# Patient Record
Sex: Male | Born: 1950 | Race: White | Hispanic: No | Smoking: Current every day smoker
Health system: Southern US, Community
[De-identification: ages and names within clinical notes are randomized; demographics above are authoritative.]

## PROBLEM LIST (undated history)

## (undated) DIAGNOSIS — I10 Essential (primary) hypertension: Secondary | ICD-10-CM

## (undated) DIAGNOSIS — J449 Chronic obstructive pulmonary disease, unspecified: Secondary | ICD-10-CM

## (undated) DIAGNOSIS — Z95810 Presence of automatic (implantable) cardiac defibrillator: Secondary | ICD-10-CM

## (undated) DIAGNOSIS — I2510 Atherosclerotic heart disease of native coronary artery without angina pectoris: Secondary | ICD-10-CM

## (undated) DIAGNOSIS — I509 Heart failure, unspecified: Secondary | ICD-10-CM

## (undated) DIAGNOSIS — E119 Type 2 diabetes mellitus without complications: Secondary | ICD-10-CM

## (undated) HISTORY — PX: PACEMAKER IMPLANT: EP1218

## (undated) HISTORY — PX: CARDIAC DEFIBRILLATOR PLACEMENT: SHX171

---

## 2021-05-12 ENCOUNTER — Encounter (HOSPITAL_COMMUNITY): Payer: Self-pay

## 2021-05-12 ENCOUNTER — Emergency Department (HOSPITAL_COMMUNITY): Payer: 59

## 2021-05-12 ENCOUNTER — Other Ambulatory Visit: Payer: Self-pay

## 2021-05-12 ENCOUNTER — Inpatient Hospital Stay (HOSPITAL_COMMUNITY)
Admission: EM | Admit: 2021-05-12 | Discharge: 2021-05-16 | DRG: 871 | Disposition: A | Discharge status: Home / Self Care | Payer: 59 | Attending: Family Medicine | Admitting: Family Medicine

## 2021-05-12 DIAGNOSIS — Z95810 Presence of automatic (implantable) cardiac defibrillator: Secondary | ICD-10-CM

## 2021-05-12 DIAGNOSIS — A419 Sepsis, unspecified organism: Secondary | ICD-10-CM | POA: Diagnosis present

## 2021-05-12 DIAGNOSIS — I10 Essential (primary) hypertension: Secondary | ICD-10-CM | POA: Diagnosis present

## 2021-05-12 DIAGNOSIS — E785 Hyperlipidemia, unspecified: Secondary | ICD-10-CM | POA: Diagnosis present

## 2021-05-12 DIAGNOSIS — Z955 Presence of coronary angioplasty implant and graft: Secondary | ICD-10-CM | POA: Diagnosis not present

## 2021-05-12 DIAGNOSIS — Z20822 Contact with and (suspected) exposure to covid-19: Secondary | ICD-10-CM | POA: Diagnosis present

## 2021-05-12 DIAGNOSIS — Z7982 Long term (current) use of aspirin: Secondary | ICD-10-CM | POA: Diagnosis not present

## 2021-05-12 DIAGNOSIS — R0789 Other chest pain: Secondary | ICD-10-CM | POA: Diagnosis not present

## 2021-05-12 DIAGNOSIS — I428 Other cardiomyopathies: Secondary | ICD-10-CM | POA: Diagnosis present

## 2021-05-12 DIAGNOSIS — E1169 Type 2 diabetes mellitus with other specified complication: Secondary | ICD-10-CM

## 2021-05-12 DIAGNOSIS — I959 Hypotension, unspecified: Secondary | ICD-10-CM | POA: Diagnosis not present

## 2021-05-12 DIAGNOSIS — Z8249 Family history of ischemic heart disease and other diseases of the circulatory system: Secondary | ICD-10-CM

## 2021-05-12 DIAGNOSIS — I255 Ischemic cardiomyopathy: Secondary | ICD-10-CM | POA: Diagnosis present

## 2021-05-12 DIAGNOSIS — F32A Depression, unspecified: Secondary | ICD-10-CM | POA: Diagnosis present

## 2021-05-12 DIAGNOSIS — I5043 Acute on chronic combined systolic (congestive) and diastolic (congestive) heart failure: Secondary | ICD-10-CM | POA: Diagnosis not present

## 2021-05-12 DIAGNOSIS — I513 Intracardiac thrombosis, not elsewhere classified: Secondary | ICD-10-CM | POA: Diagnosis present

## 2021-05-12 DIAGNOSIS — R6521 Severe sepsis with septic shock: Secondary | ICD-10-CM | POA: Diagnosis present

## 2021-05-12 DIAGNOSIS — I2510 Atherosclerotic heart disease of native coronary artery without angina pectoris: Secondary | ICD-10-CM | POA: Diagnosis present

## 2021-05-12 DIAGNOSIS — Z9119 Patient's noncompliance with other medical treatment and regimen: Secondary | ICD-10-CM | POA: Diagnosis not present

## 2021-05-12 DIAGNOSIS — Z95828 Presence of other vascular implants and grafts: Secondary | ICD-10-CM

## 2021-05-12 DIAGNOSIS — Z79899 Other long term (current) drug therapy: Secondary | ICD-10-CM

## 2021-05-12 DIAGNOSIS — J449 Chronic obstructive pulmonary disease, unspecified: Secondary | ICD-10-CM | POA: Diagnosis present

## 2021-05-12 DIAGNOSIS — Z515 Encounter for palliative care: Secondary | ICD-10-CM | POA: Diagnosis not present

## 2021-05-12 DIAGNOSIS — R072 Precordial pain: Secondary | ICD-10-CM

## 2021-05-12 DIAGNOSIS — I5022 Chronic systolic (congestive) heart failure: Secondary | ICD-10-CM | POA: Diagnosis not present

## 2021-05-12 DIAGNOSIS — I5042 Chronic combined systolic (congestive) and diastolic (congestive) heart failure: Secondary | ICD-10-CM | POA: Diagnosis present

## 2021-05-12 DIAGNOSIS — A4151 Sepsis due to Escherichia coli [E. coli]: Principal | ICD-10-CM | POA: Diagnosis present

## 2021-05-12 DIAGNOSIS — Z7984 Long term (current) use of oral hypoglycemic drugs: Secondary | ICD-10-CM

## 2021-05-12 DIAGNOSIS — F1910 Other psychoactive substance abuse, uncomplicated: Secondary | ICD-10-CM | POA: Diagnosis present

## 2021-05-12 DIAGNOSIS — B952 Enterococcus as the cause of diseases classified elsewhere: Secondary | ICD-10-CM | POA: Diagnosis present

## 2021-05-12 DIAGNOSIS — I110 Hypertensive heart disease with heart failure: Secondary | ICD-10-CM | POA: Diagnosis present

## 2021-05-12 DIAGNOSIS — N179 Acute kidney failure, unspecified: Secondary | ICD-10-CM | POA: Diagnosis present

## 2021-05-12 DIAGNOSIS — G894 Chronic pain syndrome: Secondary | ICD-10-CM | POA: Diagnosis present

## 2021-05-12 DIAGNOSIS — R079 Chest pain, unspecified: Secondary | ICD-10-CM | POA: Diagnosis not present

## 2021-05-12 DIAGNOSIS — E119 Type 2 diabetes mellitus without complications: Secondary | ICD-10-CM | POA: Diagnosis present

## 2021-05-12 DIAGNOSIS — F1410 Cocaine abuse, uncomplicated: Secondary | ICD-10-CM | POA: Diagnosis present

## 2021-05-12 DIAGNOSIS — Z7901 Long term (current) use of anticoagulants: Secondary | ICD-10-CM

## 2021-05-12 DIAGNOSIS — Z7189 Other specified counseling: Secondary | ICD-10-CM | POA: Diagnosis not present

## 2021-05-12 DIAGNOSIS — F17210 Nicotine dependence, cigarettes, uncomplicated: Secondary | ICD-10-CM | POA: Diagnosis present

## 2021-05-12 DIAGNOSIS — F419 Anxiety disorder, unspecified: Secondary | ICD-10-CM | POA: Diagnosis present

## 2021-05-12 DIAGNOSIS — K219 Gastro-esophageal reflux disease without esophagitis: Secondary | ICD-10-CM | POA: Diagnosis present

## 2021-05-12 HISTORY — DX: Essential (primary) hypertension: I10

## 2021-05-12 HISTORY — DX: Type 2 diabetes mellitus without complications: E11.9

## 2021-05-12 LAB — CBC WITH DIFFERENTIAL/PLATELET
Abs Immature Granulocytes: 0.17 10*3/uL — ABNORMAL HIGH (ref 0.00–0.07)
Basophils Absolute: 0.1 10*3/uL (ref 0.0–0.1)
Basophils Relative: 0 %
Eosinophils Absolute: 0 10*3/uL (ref 0.0–0.5)
Eosinophils Relative: 0 %
HCT: 42.6 % (ref 39.0–52.0)
Hemoglobin: 13 g/dL (ref 13.0–17.0)
Immature Granulocytes: 1 %
Lymphocytes Relative: 6 %
Lymphs Abs: 1.2 10*3/uL (ref 0.7–4.0)
MCH: 25 pg — ABNORMAL LOW (ref 26.0–34.0)
MCHC: 30.5 g/dL (ref 30.0–36.0)
MCV: 81.9 fL (ref 80.0–100.0)
Monocytes Absolute: 1.5 10*3/uL — ABNORMAL HIGH (ref 0.1–1.0)
Monocytes Relative: 7 %
Neutro Abs: 18.3 10*3/uL — ABNORMAL HIGH (ref 1.7–7.7)
Neutrophils Relative %: 86 %
Platelets: 267 10*3/uL (ref 150–400)
RBC: 5.2 MIL/uL (ref 4.22–5.81)
RDW: 19.1 % — ABNORMAL HIGH (ref 11.5–15.5)
WBC: 21.3 10*3/uL — ABNORMAL HIGH (ref 4.0–10.5)
nRBC: 0 % (ref 0.0–0.2)

## 2021-05-12 LAB — COMPREHENSIVE METABOLIC PANEL
ALT: 13 U/L (ref 0–44)
AST: 16 U/L (ref 15–41)
Albumin: 3.5 g/dL (ref 3.5–5.0)
Alkaline Phosphatase: 55 U/L (ref 38–126)
Anion gap: 6 (ref 5–15)
BUN: 23 mg/dL (ref 8–23)
CO2: 23 mmol/L (ref 22–32)
Calcium: 8.4 mg/dL — ABNORMAL LOW (ref 8.9–10.3)
Chloride: 103 mmol/L (ref 98–111)
Creatinine, Ser: 1.62 mg/dL — ABNORMAL HIGH (ref 0.61–1.24)
GFR, Estimated: 46 mL/min — ABNORMAL LOW (ref >60)
Glucose, Bld: 144 mg/dL — ABNORMAL HIGH (ref 70–99)
Potassium: 4.4 mmol/L (ref 3.5–5.1)
Sodium: 132 mmol/L — ABNORMAL LOW (ref 135–145)
Total Bilirubin: 3.1 mg/dL — ABNORMAL HIGH (ref 0.3–1.2)
Total Protein: 7.5 g/dL (ref 6.5–8.1)

## 2021-05-12 LAB — URINALYSIS, ROUTINE W REFLEX MICROSCOPIC
Bilirubin Urine: NEGATIVE
Glucose, UA: NEGATIVE mg/dL
Hgb urine dipstick: NEGATIVE
Ketones, ur: NEGATIVE mg/dL
Leukocytes,Ua: NEGATIVE
Nitrite: NEGATIVE
Protein, ur: NEGATIVE mg/dL
Specific Gravity, Urine: >1.046 — ABNORMAL HIGH (ref 1.005–1.030)
pH: 5 (ref 5.0–8.0)

## 2021-05-12 LAB — C-REACTIVE PROTEIN: CRP: 14.3 mg/dL — ABNORMAL HIGH (ref <1.0)

## 2021-05-12 LAB — ETHANOL: Alcohol, Ethyl (B): <10 mg/dL (ref <10)

## 2021-05-12 LAB — RAPID URINE DRUG SCREEN, HOSP PERFORMED
Amphetamines: NOT DETECTED
Barbiturates: NOT DETECTED
Benzodiazepines: POSITIVE — AB
Cocaine: NOT DETECTED
Opiates: NOT DETECTED
Tetrahydrocannabinol: POSITIVE — AB

## 2021-05-12 LAB — LACTIC ACID, PLASMA
Lactic Acid, Venous: 0.9 mmol/L (ref 0.5–1.9)
Lactic Acid, Venous: 0.7 mmol/L (ref 0.5–1.9)

## 2021-05-12 LAB — RESP PANEL BY RT-PCR (FLU A&B, COVID) ARPGX2
Influenza A by PCR: NEGATIVE
Influenza B by PCR: NEGATIVE
SARS Coronavirus 2 by RT PCR: NEGATIVE

## 2021-05-12 LAB — PROTIME-INR
INR: 1.3 — ABNORMAL HIGH (ref 0.8–1.2)
Prothrombin Time: 15.9 seconds — ABNORMAL HIGH (ref 11.4–15.2)

## 2021-05-12 LAB — TROPONIN I (HIGH SENSITIVITY)
Troponin I (High Sensitivity): 9 ng/L (ref <18)
Troponin I (High Sensitivity): 9 ng/L (ref <18)

## 2021-05-12 LAB — D-DIMER, QUANTITATIVE: D-Dimer, Quant: 0.99 ug/mL-FEU — ABNORMAL HIGH (ref 0.00–0.50)

## 2021-05-12 LAB — SEDIMENTATION RATE: Sed Rate: 31 mm/hr — ABNORMAL HIGH (ref 0–16)

## 2021-05-12 LAB — CBG MONITORING, ED: Glucose-Capillary: 89 mg/dL (ref 70–99)

## 2021-05-12 LAB — BRAIN NATRIURETIC PEPTIDE: B Natriuretic Peptide: 44 pg/mL (ref 0.0–100.0)

## 2021-05-12 MED ORDER — PAROXETINE HCL 20 MG PO TABS
20.0000 mg | ORAL_TABLET | Freq: Every day | ORAL | Status: DC
  Administered 2021-05-12 – 2021-05-16 (×5): 20 mg via ORAL
  Filled 2021-05-12 (×5): qty 1

## 2021-05-12 MED ORDER — ALBUTEROL SULFATE (2.5 MG/3ML) 0.083% IN NEBU: 2.5000 mg | INHALATION_SOLUTION | RESPIRATORY_TRACT | Status: DC | PRN

## 2021-05-12 MED ORDER — ATORVASTATIN CALCIUM 40 MG PO TABS: 40.0000 mg | ORAL_TABLET | Freq: Every day | ORAL | Status: DC

## 2021-05-12 MED ORDER — ACETAMINOPHEN 500 MG PO TABS
1000.0000 mg | ORAL_TABLET | Freq: Once | ORAL | Status: AC
  Administered 2021-05-12: 1000 mg via ORAL
  Filled 2021-05-12: qty 2

## 2021-05-12 MED ORDER — IOHEXOL 350 MG/ML SOLN
100.0000 mL | Freq: Once | INTRAVENOUS | Status: AC | PRN
  Administered 2021-05-12: 100 mL via INTRAVENOUS

## 2021-05-12 MED ORDER — SODIUM CHLORIDE 0.9 % IV SOLN
2.0000 g | Freq: Two times a day (BID) | INTRAVENOUS | Status: DC
  Administered 2021-05-13 (×2): 2 g via INTRAVENOUS
  Filled 2021-05-12 (×2): qty 2

## 2021-05-12 MED ORDER — PANTOPRAZOLE SODIUM 40 MG PO TBEC
40.0000 mg | DELAYED_RELEASE_TABLET | Freq: Every day | ORAL | Status: DC
  Administered 2021-05-12 – 2021-05-16 (×5): 40 mg via ORAL
  Filled 2021-05-12 (×5): qty 1

## 2021-05-12 MED ORDER — SODIUM CHLORIDE 0.9 % IV BOLUS: 500.0000 mL | Freq: Once | INTRAVENOUS | Status: DC

## 2021-05-12 MED ORDER — WARFARIN SODIUM 5 MG PO TABS
5.0000 mg | ORAL_TABLET | Freq: Once | ORAL | Status: AC
  Administered 2021-05-12: 5 mg via ORAL
  Filled 2021-05-12: qty 1

## 2021-05-12 MED ORDER — ONDANSETRON HCL 4 MG PO TABS: 4.0000 mg | ORAL_TABLET | Freq: Four times a day (QID) | ORAL | Status: DC | PRN

## 2021-05-12 MED ORDER — POLYETHYLENE GLYCOL 3350 17 G PO PACK: 17.0000 g | PACK | Freq: Every day | ORAL | Status: DC | PRN

## 2021-05-12 MED ORDER — INSULIN ASPART 100 UNIT/ML IJ SOLN: 0.0000 [IU] | Freq: Every day | INTRAMUSCULAR | Status: DC

## 2021-05-12 MED ORDER — WARFARIN - PHARMACIST DOSING INPATIENT: Freq: Every day | Status: DC

## 2021-05-12 MED ORDER — ATORVASTATIN CALCIUM 40 MG PO TABS
40.0000 mg | ORAL_TABLET | Freq: Every day | ORAL | Status: DC
  Administered 2021-05-12 – 2021-05-14 (×3): 40 mg via ORAL
  Filled 2021-05-12 (×3): qty 1

## 2021-05-12 MED ORDER — LACTATED RINGERS IV SOLN: INTRAVENOUS | Status: DC

## 2021-05-12 MED ORDER — SODIUM CHLORIDE 0.9 % IV BOLUS
500.0000 mL | Freq: Once | INTRAVENOUS | Status: AC
  Administered 2021-05-12: 500 mL via INTRAVENOUS

## 2021-05-12 MED ORDER — ONDANSETRON HCL 4 MG/2ML IJ SOLN: 4.0000 mg | Freq: Four times a day (QID) | INTRAMUSCULAR | Status: DC | PRN

## 2021-05-12 MED ORDER — VANCOMYCIN HCL 750 MG/150ML IV SOLN
750.0000 mg | Freq: Two times a day (BID) | INTRAVENOUS | Status: DC
  Administered 2021-05-13 – 2021-05-14 (×4): 750 mg via INTRAVENOUS
  Filled 2021-05-12 (×8): qty 150

## 2021-05-12 MED ORDER — SODIUM CHLORIDE 0.9 % IV SOLN
2.0000 g | Freq: Once | INTRAVENOUS | Status: AC
  Administered 2021-05-12: 2 g via INTRAVENOUS
  Filled 2021-05-12: qty 2

## 2021-05-12 MED ORDER — ACETAMINOPHEN 325 MG PO TABS
650.0000 mg | ORAL_TABLET | Freq: Four times a day (QID) | ORAL | Status: DC | PRN
  Administered 2021-05-12 – 2021-05-16 (×5): 650 mg via ORAL
  Filled 2021-05-12 (×5): qty 2

## 2021-05-12 MED ORDER — INSULIN ASPART 100 UNIT/ML IJ SOLN
0.0000 [IU] | Freq: Three times a day (TID) | INTRAMUSCULAR | Status: DC
  Administered 2021-05-13: 2 [IU] via SUBCUTANEOUS
  Administered 2021-05-14: 1 [IU] via SUBCUTANEOUS
  Administered 2021-05-15: 2 [IU] via SUBCUTANEOUS
  Administered 2021-05-16: 1 [IU] via SUBCUTANEOUS

## 2021-05-12 MED ORDER — METRONIDAZOLE 500 MG/100ML IV SOLN
500.0000 mg | Freq: Three times a day (TID) | INTRAVENOUS | Status: DC
  Administered 2021-05-12 – 2021-05-16 (×11): 500 mg via INTRAVENOUS
  Filled 2021-05-12 (×11): qty 100

## 2021-05-12 MED ORDER — VANCOMYCIN HCL IN DEXTROSE 1-5 GM/200ML-% IV SOLN
1000.0000 mg | Freq: Once | INTRAVENOUS | Status: AC
  Administered 2021-05-12: 1000 mg via INTRAVENOUS
  Filled 2021-05-12: qty 200

## 2021-05-12 MED ORDER — METRONIDAZOLE 500 MG/100ML IV SOLN
500.0000 mg | Freq: Once | INTRAVENOUS | Status: AC
  Administered 2021-05-12: 500 mg via INTRAVENOUS
  Filled 2021-05-12: qty 100

## 2021-05-12 MED ORDER — ACETAMINOPHEN 650 MG RE SUPP: 650.0000 mg | Freq: Four times a day (QID) | RECTAL | Status: DC | PRN

## 2021-05-12 MED ORDER — SODIUM CHLORIDE 0.9 % IV BOLUS
1000.0000 mL | Freq: Once | INTRAVENOUS | Status: AC
  Administered 2021-05-12: 1000 mL via INTRAVENOUS

## 2021-05-12 MED ORDER — NOREPINEPHRINE 4 MG/250ML-% IV SOLN
0.0000 ug/min | INTRAVENOUS | Status: DC
  Filled 2021-05-12: qty 250

## 2021-05-12 NOTE — ED Notes (Signed)
Admitting MD wants IV fluids held for now. IV fluids were stopped.

## 2021-05-12 NOTE — ED Provider Notes (Signed)
Emergency Department Provider Note   I have reviewed the triage vital signs and the nursing notes.   HISTORY  Chief Complaint Chest Pain   HPI Roger Mooney is a 70 y.o. male with PMH of HTN, DM, and pacemaker for reason unknown by the patient presents to the emergency department with chest discomfort.  Reports 24 to 48 hours of pressure.  States symptoms worsened last night.  He ultimately called EMS today and was found to be tachycardic.  He received aspirin along with 1 nitroglycerin with no improvement in pain symptoms.  He denies any fevers.  Patient is very unaware of his medical history.  He does have a pacemaker but is unsure exactly why that is the case.  He states he has been seen at "so many hospitals" but cannot tell me exactly where or when.  He does not have a cardiologist.  He tells me that he does take prescription medicines but is unsure which ones.  Tells me that he is currently "trying to get back on my feet" and living with family locally. Denies fever or cough.    Past Medical History:  Diagnosis Date  . Diabetes mellitus without complication (HCC)   . Hypertension     There are no problems to display for this patient.   Past Surgical History:  Procedure Laterality Date  . PACEMAKER IMPLANT      Allergies Patient has no known allergies.  No family history on file.  Social History Social History   Tobacco Use  . Smoking status: Current Every Day Smoker    Types: Cigarettes  . Smokeless tobacco: Never Used  Substance Use Topics  . Alcohol use: Not Currently  . Drug use: Yes    Types: Marijuana    Review of Systems  Constitutional: No fever/chills Eyes: No visual changes. ENT: No sore throat. Cardiovascular: Positive chest pain. Respiratory: Denies shortness of breath. Gastrointestinal: No abdominal pain. Mild nausea, no vomiting.  No diarrhea.  No constipation. Genitourinary: Negative for dysuria. Musculoskeletal: Negative for back  pain. Skin: Negative for rash. Neurological: Negative for headaches, focal weakness or numbness.  10-point ROS otherwise negative.  ____________________________________________   PHYSICAL EXAM:  VITAL SIGNS: ED Triage Vitals  Enc Vitals Group     BP 05/12/21 1139 (!) 89/73     Pulse Rate 05/12/21 1139 (!) 120     Resp 05/12/21 1142 20     Temp 05/12/21 1139 99.9 F (37.7 C)     Temp Source 05/12/21 1139 Oral     SpO2 05/12/21 1139 92 %     Weight 05/12/21 1141 180 lb (81.6 kg)     Height 05/12/21 1141 5\' 11"  (1.803 m)   Constitutional: Alert and oriented. Well appearing and in no acute distress. Eyes: Conjunctivae are normal.  Head: Atraumatic. Nose: No congestion/rhinnorhea. Mouth/Throat: Mucous membranes are moist.  Neck: No stridor.  Cardiovascular: Normal rate, regular rhythm. Good peripheral circulation. Grossly normal heart sounds.   Respiratory: Normal respiratory effort.  No retractions. Lungs CTAB. Gastrointestinal: Soft and nontender. No distention.  Musculoskeletal: No lower extremity tenderness nor edema. No gross deformities of extremities. Neurologic:  Normal speech and language. No gross focal neurologic deficits are appreciated.  Skin:  Skin is warm, dry and intact. No rash noted.   ____________________________________________   LABS (all labs ordered are listed, but only abnormal results are displayed)  Labs Reviewed  COMPREHENSIVE METABOLIC PANEL - Abnormal; Notable for the following components:      Result  Value   Sodium 132 (*)    Glucose, Bld 144 (*)    Creatinine, Ser 1.62 (*)    Calcium 8.4 (*)    Total Bilirubin 3.1 (*)    GFR, Estimated 46 (*)    All other components within normal limits  CBC WITH DIFFERENTIAL/PLATELET - Abnormal; Notable for the following components:   WBC 21.3 (*)    MCH 25.0 (*)    RDW 19.1 (*)    Neutro Abs 18.3 (*)    Monocytes Absolute 1.5 (*)    Abs Immature Granulocytes 0.17 (*)    All other components  within normal limits  D-DIMER, QUANTITATIVE - Abnormal; Notable for the following components:   D-Dimer, Quant 0.99 (*)    All other components within normal limits  CULTURE, BLOOD (ROUTINE X 2)  CULTURE, BLOOD (ROUTINE X 2)  RESP PANEL BY RT-PCR (FLU A&B, COVID) ARPGX2  BRAIN NATRIURETIC PEPTIDE  LACTIC ACID, PLASMA  LACTIC ACID, PLASMA  URINALYSIS, ROUTINE W REFLEX MICROSCOPIC  TROPONIN I (HIGH SENSITIVITY)  TROPONIN I (HIGH SENSITIVITY)   ____________________________________________  EKG   EKG Interpretation  Date/Time:  Monday May 12 2021 11:41:49 EDT Ventricular Rate:  116 PR Interval:  120 QRS Duration: 89 QT Interval:  313 QTC Calculation: 435 R Axis:   -45 Text Interpretation: Sinus tachycardia Multiform ventricular premature complexes Biatrial enlargement Left anterior fascicular block Abnormal lateral Q waves Anteroseptal infarct, old No old tracing for comparison Confirmed by Alona Bene [phone]) on 05/12/2021 11:44:18 AM       ____________________________________________  RADIOLOGY  DG Chest Portable 1 View  Result Date: 05/12/2021 CLINICAL DATA:  70 year old male with history of chest pain. EXAM: PORTABLE CHEST 1 VIEW COMPARISON:  No priors. FINDINGS: Widespread areas of interstitial prominence and diffuse peribronchial cuffing. Lung volumes are normal. No consolidative airspace disease. No pleural effusions. No pneumothorax. No pulmonary nodule or mass noted. Pulmonary vasculature and the cardiomediastinal silhouette are within normal limits. Left-sided pacemaker/AICD in place with lead tips projecting over the expected location of the right atrium and right ventricle. IMPRESSION: 1. The appearance of the chest suggests bronchitis, as above. Electronically Signed   By: Trudie Reed M.D.   On: 05/12/2021 12:38    ____________________________________________   PROCEDURES  Procedure(s) performed:   Procedures  CRITICAL CARE Performed by: Maia Plan Total critical care time: 35 minutes Critical care time was exclusive of separately billable procedures and treating other patients. Critical care was necessary to treat or prevent imminent or life-threatening deterioration. Critical care was time spent personally by me on the following activities: development of treatment plan with patient and/or surrogate as well as nursing, discussions with consultants, evaluation of patient's response to treatment, examination of patient, obtaining history from patient or surrogate, ordering and performing treatments and interventions, ordering and review of laboratory studies, ordering and review of radiographic studies, pulse oximetry and re-evaluation of patient's condition.  Alona Bene, MD Emergency Medicine  ____________________________________________   INITIAL IMPRESSION / ASSESSMENT AND PLAN / ED COURSE  Pertinent labs & imaging results that were available during my care of the patient were reviewed by me and considered in my medical decision making (see chart for details).   Patient presents to the emergency department with pain in the chest.  He arrives tachycardic with soft blood pressures.  Borderline fever but no infectious complaints.  History is very limited.  Patient has very little insight into his medical history and conditions.  No known history of dementia.  No family here for additional history.  I have looked in care everywhere but do not have additional records at this time.  Plan for septic work-up along with troponins, BNP, IV fluids.  Patient has warm extremities.  He does not appear acutely volume overloaded. Differential includes PE, ACS, dehydration. EKG interpreted by me as above.   03:00 PM  BP improving with IVF. Started empiric abx with significant leukocytosis. Lactate normal. Patient remains well perfused and is fluid responsive here. Exam not consistent with cardiogenic shock. Will interrogate [employer]  pacemaker. Cultures sent. COVID negative. D-dimer is elevated. Plan for CTA PE study.   Care transferred to Dr. Charm Barges pending CTA.  ____________________________________________  FINAL CLINICAL IMPRESSION(S) / ED DIAGNOSES  Final diagnoses:  Precordial chest pain     MEDICATIONS GIVEN DURING THIS VISIT:  Medications  vancomycin (VANCOCIN) IVPB 1000 mg/200 mL premix (has no administration in time range)  ceFEPIme (MAXIPIME) 2 g in sodium chloride 0.9 % 100 mL IVPB (has no administration in time range)  vancomycin (VANCOREADY) IVPB 750 mg/150 mL (has no administration in time range)  lactated ringers infusion (has no administration in time range)  sodium chloride 0.9 % bolus 500 mL (0 mLs Intravenous Stopped 05/12/21 1223)  acetaminophen (TYLENOL) tablet 1,000 mg (1,000 mg Oral Given 05/12/21 1354)  ceFEPIme (MAXIPIME) 2 g in sodium chloride 0.9 % 100 mL IVPB (0 g Intravenous Stopped 05/12/21 1443)  metroNIDAZOLE (FLAGYL) IVPB 500 mg (500 mg Intravenous New Bag/Given 05/12/21 1356)  sodium chloride 0.9 % bolus 1,000 mL (1,000 mLs Intravenous New Bag/Given 05/12/21 1356)  iohexol (OMNIPAQUE) 350 MG/ML injection 100 mL (100 mLs Intravenous Contrast Given 05/12/21 1511)    Note:  This document was prepared using Dragon voice recognition software and may include unintentional dictation errors.  Alona Bene, MD, [facility] Emergency Medicine    Icelynn Onken, Arlyss Repress, MD 05/15/21 [phone]

## 2021-05-12 NOTE — Progress Notes (Signed)
Pharmacy Antibiotic Note  Roger Mooney a 70 y.o. male admitted on 05/12/2021 with sepsis.  Pharmacy has been consulted for vancomycin and cefepime dosing.  Plan: Vancomycin 750mg  IV every 12 hours.  Goal trough 15-20 mcg/mL. Cefepime 2gm IV every 12 hours.  Medical History: Past Medical History:  Diagnosis Date  . Diabetes mellitus without complication (HCC)   . Hypertension     Allergies:  No Known Allergies  Filed Weights   05/12/21 1141  Weight: 81.6 kg (180 lb)    CBC Latest Ref Rng & Units 05/12/2021  WBC 4.0 - 10.5 K/uL 21.3(H)  Hemoglobin 13.0 - 17.0 g/dL 05/14/2021  Hematocrit 10.9 - 52.0 % 42.6  Platelets 150 - 400 K/uL 267     Estimated Creatinine Clearance: 45.8 mL/min (A) (by C-G formula based on SCr of 1.62 mg/dL (H)).  Antibiotics Given (last 72 hours)    Date/Time Action Medication Dose Rate   05/12/21 1356 New Bag/Given   metroNIDAZOLE (FLAGYL) IVPB 500 mg 500 mg 100 mL/hr   05/12/21 1401 New Bag/Given   ceFEPIme (MAXIPIME) 2 g in sodium chloride 0.9 % 100 mL IVPB 2 g 200 mL/hr      Antimicrobials this admission: Cefepime 05/12/2021  >>  vancomycin 05/12/2021  >>  Metronidazole 05/12/2021   x 1   Microbiology results: 05/12/2021  BCx: sent 05/12/2021  UCx: sent 05/12/2021  Resp Panel: negative 05/12/2021  MRSA PCR: sent  Thank you for allowing pharmacy to be a part of this patient's care.  05/14/2021, PharmD Clinical Pharmacist

## 2021-05-12 NOTE — ED Notes (Signed)
Pt cleaned due to incontience at this time. Pt continues to curse and yell at staff.

## 2021-05-12 NOTE — ED Notes (Signed)
Levophed held at this time due to sbp parameters >90. Will continue to monitor

## 2021-05-12 NOTE — H&P (Signed)
History and Physical    Roger Mooney KGU:[id number] DOB: [birth year] DOA: 05/12/2021  PCP: Pcp, No   Patient coming from: Home  I have personally briefly reviewed patient's old medical records in [place]  Chief Complaint: Chest pain  HPI: Roger Mooney is a 70 y.o. male with medical history significant for LV thrombus, chronic systolic heart failure EF of 25%, AICD, CKD, diabetes mellitus.    Patient presented to the ED today with complaints of chest pain.  History from patient is limited as patient is a poor historian.  Patient reports onset of chest pain over the past 2 days, intermittent.  Reports improvement in chest pain for with pain medications, he is unaware of any aggravating factors. He Is unable to tell me if chest pain is related to activity.  He reports associated difficulty breathing with chest pains.  Reports chest pain is both on the right central and left side of his chest.  No associated nausea or vomiting.  No loose stools.  He reports appetite has been okay. On my evaluation, patient was getting easily agitated and talking in a more aggressive voice when trying to answer questions.  Patient used to live in [place], moved to the area and lives with his granddaughter Roger Mooney.  Patient was recently hospitalized at [facility] for 4/14-4/41 and prior to that at [facility] [place] 4/9 through 4/14-where he was managed for respiratory failure secondary to aspiration pneumonia with septic shock and requiring mechanical ventilation and Levophed.  UDS was positive for benzos, cocaine, opiates and THC.  Hospitalization was complicated by development of vascular congestion/fluid overload requiring Lasix, ventricular tachycardia requiring amiodarone, on CKD with creatinine up to 1.5, discharge creatinine of 1.19.  An LV thrombus was found on echocardiogram 03/27/2021, requiring warfarin.  Patient required CIWA for alcohol withdrawal.  Patient was subsequently  transferred to tertiary center[facility] for further care. The only culture results I can see-  tracheal aspirate which showed light growth of yeast, 2 colonies of yeast.  ED Course: T max- 99.9, heart rate initially 120s improved to 70s, respiratory rate 16- 31.  Blood pressure systolic dropped to 62/83, was persistent despite 2 L bolus of fluid given.  WBC 21.  Troponin 9.  BNP 44.  EKG shows sinus tachycardia rate 116.  Creatinine elevated 1.6.  CTA chest - negative for PE, showed extensive pericardial thickening and small volume of pericardial fluid.  Clinical correlation for signs or symptoms of acute pericarditis recommended.   EDP talked to cardiologist on-call, Dr. Marlou Porch, he did not feel that the pericardial thickening and small pleural effusion fit the picture of the patient's clinical condition, and did not think this required transfer to [facility] for admission.  I was initially called to admit patient, with systolic in the 15V status post 2 L bolus, now on 125 cc/h Ringer's lactate, EF of 25%, recent septic shock and possible infectious etiology for this presentation, I requested central line placement.  ED provider placed a femoral line, and subsequently blood pressure improved, Levophed was held.   Review of Systems: As per HPI all other systems reviewed and negative.  Past Medical History:  Diagnosis Date  . Diabetes mellitus without complication ([place])   . Hypertension     Past Surgical History:  Procedure Laterality Date  . PACEMAKER IMPLANT       reports that he has been smoking cigarettes. He has never used smokeless tobacco. He reports previous alcohol use. He reports current drug  use. Drug: Marijuana.  No Known Allergies  Family history of hypertension.  Prior to Admission medications   Medication Sig Start Date End Date Taking? Authorizing Provider  aspirin 81 MG chewable tablet Chew 81 mg by mouth daily. Took 3-4 weeks ago 02/24/21  Yes [provider]   atorvastatin (LIPITOR) 40 MG tablet Take 1 tablet by mouth daily. 03/19/21  Yes [provider]  isosorbide mononitrate (IMDUR) 30 MG 24 hr tablet Take 30 mg by mouth daily. 05/04/21  Yes [provider]  lisinopril (ZESTRIL) 5 MG tablet Take 5 mg by mouth daily. 03/19/21  Yes [provider]  metFORMIN (GLUCOPHAGE) 500 MG tablet Take 500 mg by mouth 2 (two) times daily. 03/19/21  Yes [provider]  metoprolol succinate (TOPROL-XL) 25 MG 24 hr tablet Take 0.5 tablets by mouth daily. 05/04/21  Yes [provider]  pantoprazole (PROTONIX) 40 MG tablet Take 40 mg by mouth daily. 03/19/21  Yes [provider]  PARoxetine (PAXIL) 20 MG tablet Take 20 mg by mouth daily. 05/04/21  Yes [provider]  warfarin (COUMADIN) 2 MG tablet Take 2 mg by mouth daily. 04/03/21  Yes [provider]    Physical Exam: Vitals:   05/12/21 1845 05/12/21 1900 05/12/21 1915 05/12/21 1930  BP: 98/71 95/69 102/64 101/73  Pulse: 77 78 77 87  Resp: _0 Temp:      TempSrc:      SpO2: 100% 99% 100% 100%  Weight:      Height:        Constitutional: NAD, calm, comfortable Vitals:   05/12/21 1845 05/12/21 1900 05/12/21 1915 05/12/21 1930  BP: 98/71 95/69 102/64 101/73  Pulse: 77 78 77 87  Resp: _1 Temp:      TempSrc:      SpO2: 100% 99% 100% 100%  Weight:      Height:       Eyes: PERRL, lids and conjunctivae normal ENMT: Mucous membranes are moist.   Neck: normal, supple, no masses, no thyromegaly Respiratory: clear to auscultation bilaterally, no wheezing, no crackles. Normal respiratory effort. No accessory muscle use.  Cardiovascular: Regular rate and rhythm, no murmurs / rubs / gallops. No extremity edema. 2+ pedal pulses.   Abdomen: no tenderness, no masses palpated. No hepatosplenomegaly. Bowel sounds positive.  Musculoskeletal: no clubbing / cyanosis. No joint deformity upper and lower extremities. Good ROM, no  contractures. Normal muscle tone.  Skin: no rashes, lesions, ulcers. No induration Neurologic: No apparent cranial abnormality, moving extremities spontaneously. Psychiatric: Normal judgment and insight. Alert and oriented x 3. Normal mood.   Labs on Admission: I have personally reviewed following labs and imaging studies  CBC: Recent Labs  Lab 05/12/21 1201  WBC 21.3*  NEUTROABS 18.3*  HGB 13.0  HCT 42.6  MCV 81.9  PLT 697   Basic Metabolic Panel: Recent Labs  Lab 05/12/21 1201  NA 132*  K 4.4  CL 103  CO2 23  GLUCOSE 144*  BUN 23  CREATININE 1.62*  CALCIUM 8.4*   Liver Function Tests: Recent Labs  Lab 05/12/21 1201  AST 16  ALT 13  ALKPHOS 55  BILITOT 3.1*  PROT 7.5  ALBUMIN 3.5   Coagulation Profile: Recent Labs  Lab 05/12/21 1755  INR 1.3*    Radiological Exams on Admission: CT Angio Chest PE W and/or Wo Contrast  Result Date: 05/12/2021 CLINICAL DATA:  70 year old male with history of central chest pain and  shortness of breath. EXAM: CT ANGIOGRAPHY CHEST WITH CONTRAST TECHNIQUE: Multidetector CT imaging of the chest was performed using the standard protocol during bolus administration of intravenous contrast. Multiplanar CT image reconstructions and MIPs were obtained to evaluate the vascular anatomy. CONTRAST:  154m OMNIPAQUE IOHEXOL 350 MG/ML SOLN COMPARISON:  No priors. FINDINGS: Cardiovascular: No filling defects within the pulmonary arterial to suggest pulmonary emboli. Heart size is normal. Extensive pericardial thickening and small volume of pericardial fluid. No pericardial calcification. There is aortic atherosclerosis, as well as atherosclerosis of the great vessels of the mediastinum and the coronary arteries, including calcified atherosclerotic plaque in the left main, left anterior descending, left circumflex and right coronary arteries. Proximal left anterior descending coronary artery stent. Left-sided pacemaker device in place with lead tips  terminating in the right atrium and right ventricular apex. Mediastinum/Nodes: No pathologically enlarged mediastinal or hilar lymph nodes. Esophagus is unremarkable in appearance. No axillary lymphadenopathy. Lungs/Pleura: Study is limited by extensive patient respiratory motion. With these limitations in mind, there are no definite suspicious appearing pulmonary nodules or masses are noted. Patchy areas of ground-glass attenuation and septal thickening with thickening of the peribronchovascular interstitium and regional architectural distortion, most evident throughout the mid to upper lungs, concerning for potential interstitial lung disease such as hypersensitivity pneumonitis. No acute consolidative airspace disease. Trace right pleural effusion. No left pleural effusion. Upper Abdomen: Aortic atherosclerosis. Musculoskeletal: There are no aggressive appearing lytic or blastic lesions noted in the visualized portions of the skeleton. Review of the MIP images confirms the above findings. IMPRESSION: 1. No evidence of pulmonary embolism. 2. Extensive pericardial thickening and small volume of pericardial fluid. Clinical correlation for signs and symptoms of acute pericarditis is recommended. No pericardial calcification noted at this time. 3. Trace right pleural effusion. 4. The appearance of the lungs is suggestive of potential interstitial lung disease such as chronic hypersensitivity pneumonitis. Follow-up nonemergent high-resolution chest CT is recommended in 6 months to assess for temporal changes in the appearance of the lung parenchyma. Additionally, nonemergent outpatient referral to Pulmonology is suggested in the near future for further clinical evaluation. 5. Aortic atherosclerosis, in addition to left main and 3 vessel coronary artery disease. Please note that although the presence of coronary artery calcium documents the presence of coronary artery disease, the severity of this disease and any  potential stenosis cannot be assessed on this non-gated CT examination. Assessment for potential risk factor modification, dietary therapy or pharmacologic therapy may be warranted, if clinically indicated. Aortic Atherosclerosis (ICD10-I70.0). Electronically Signed   By: DVinnie LangtonM.D.   On: 05/12/2021 15:39   DG Chest Portable 1 View  Result Date: 05/12/2021 CLINICAL DATA:  70year old male with history of chest pain. EXAM: PORTABLE CHEST 1 VIEW COMPARISON:  No priors. FINDINGS: Widespread areas of interstitial prominence and diffuse peribronchial cuffing. Lung volumes are normal. No consolidative airspace disease. No pleural effusions. No pneumothorax. No pulmonary nodule or mass noted. Pulmonary vasculature and the cardiomediastinal silhouette are within normal limits. Left-sided pacemaker/AICD in place with lead tips projecting over the expected location of the right atrium and right ventricle. IMPRESSION: 1. The appearance of the chest suggests bronchitis, as above. Electronically Signed   By: DVinnie LangtonM.D.   On: 05/12/2021 12:38    EKG: Independently reviewed.  Sinus tachycardia rate 116, QTc 435.  LAFB.  ST abnormalities V1 through V3.  Assessment/Plan Principal Problem:   Hypotension Active Problems:   AKI (acute kidney injury) ([place]   HTN (hypertension)  DM (diabetes mellitus) (HCC)   Chronic systolic CHF (congestive heart failure) (HCC)   LV (left ventricular) mural thrombus   Hypotension-systolic down to 82/68, improved after 2 L bolus, systolic now up to 102.  Femoral Central line placed.  Temperature of 99.9, with significant leukocytosis of 21. Lactic acid 0.9. Recent complicated hospitalization with septic shock requiring pressors and intubation for aspiration pneumonia. -Continue broad-spectrum antibiotics for now-consult -Hold Levophed for now, start if MAP is less than 65 -Follow-up blood and urine cultures -UA pending -Hold home Imdur, lisinopril 5 mg,  metoprolol 25 mg  Chest pain-troponins unremarkable, CTA showing extensive thickening and small volume of pericardial fluid, correlate for signs and symptoms of acute pericarditis.  EKG not convincing for pericarditis, showing some ST abnormalities in V1 through V3 only.  No prior EKG to compare. - EDP talked to Dr. Skains, did not feel CT findings fit or explained patient's clinical picture, and patient did not warrant transfer to [facility] for this -Cardiology consultation in the morning -Check ESR, CRP  Chronic systolic CHF-appears euvolemic, and is hypotensive. BNP- 44. Per Care Everywhere EF 25%.  Establish care with a cardiologist.  Received his care in [place].  Patient and EMS reports patient's AICD in emits an audible beep. -AICD interrogated in ED-functioning normal, but suggests fluid buildup, CHF. -May need pressors, hold diuretics for now.  AKI -creatinine 1.6, recent discharge creatinine 1.1.  With concomitant hypotension possible prerenal etiology versus cardiorenal. -Hold further fluids for now.  Abnormal CT findings -CTA chest suggest chronic hypersensitivity pneumonitis, nonemergent high-resolution chest CT recommended in 6 months.  Controlled diabetes mellitus-random glucose 144.  Hemoglobin A1c 6.5. - SSI- S -Hold metformin  LV thrombus-recent diagnosis 03/2021.  Patient was placed on warfarin, INR today 1.3 indicating noncompliance. - warfarin per pharmacy  Polysubstance use-history of cocaine, benzos, THC on opioid positive urine -UDS pending  DVT prophylaxis: Warfarin Code Status: Full code Family Communication: None at bedside Disposition Plan: ~ 2 days Consults called: Cardiology Admission status: Inpatient, stepdown. I certify that at the point of admission it is my clinical judgment that the patient will require inpatient hospital care spanning beyond 2 midnights from the point of admission due to high intensity of service, high risk for further  deterioration and high frequency of surveillance required.    Ejiroghene E Emokpae MD [facility]  05/12/2021, 9:23 PM    

## 2021-05-12 NOTE — ED Notes (Signed)
[employer] interrogation completed.

## 2021-05-12 NOTE — ED Notes (Signed)
Per [employer] pt's pacemaker/defibrillator is working fine, not findings. However they did mention it looked like pt has fluid build up suggestive of CHF.

## 2021-05-12 NOTE — ED Provider Notes (Signed)
signout from Dr. Jacqulyn Bath.  70 year old male here with chest tightness since last evening.  Blood pressures have been soft.  Initially tachycardic and improved with fluids.  Work-up showing elevated white count elevated creatinine elevated D-dimer.  Signout is to follow-up on CT PE and patient will likely need admission to the hospital.  CT PE does not show any large PE.  They did comment upon some pericardial thickening and a small amount of pericardial effusion.  He has been started on IV antibiotics.His initial lactate and troponins are not elevated.  COVID and flu testing negative. Physical Exam  BP 99/75   Pulse 76   Temp 99.9 F (37.7 C) (Oral)   Resp (!) 24   Ht 5\' 11"  (1.803 m)   Wt 81.6 kg   SpO2 100%   BMI 25.10 kg/m   Physical Exam  ED Course/Procedures   Clinical Course as of 05/12/21 1818  Mon May 12, 2021  1720 I consulted Dr. 1721 from [facility] cardiology.  He reviewed the report of the CT angio.  He did not feel that the pericardial thickening and small effusion fit the picture of the patient's current clinical condition.  He did not feel that the patient needed to be transferred to [facility] campus for admission. [MB]    Clinical Course User Index [MB] [facility], MD    .Critical Care Performed by: Terrilee Files, MD Authorized by: Terrilee Files, MD   Critical care provider statement:    Critical care time (minutes):  45   Critical care time was exclusive of:  Separately billable procedures and treating other patients   Critical care was necessary to treat or prevent imminent or life-threatening deterioration of the following conditions:  Circulatory failure, sepsis and respiratory failure   Critical care was time spent personally by me on the following activities:  Discussions with consultants, evaluation of patient's response to treatment, examination of patient, ordering and performing treatments and interventions, ordering and review of laboratory studies,  ordering and review of radiographic studies, pulse oximetry, re-evaluation of patient's condition, obtaining history from patient or surrogate, review of old charts and development of treatment plan with patient or surrogate .Central Line  Date/Time: 05/12/2021 6:19 PM Performed by: 05/14/2021, MD Authorized by: Terrilee Files, MD   Consent:    Consent obtained:  Verbal   Consent given by:  Patient   Risks, benefits, and alternatives were discussed: yes     Risks discussed:  Arterial puncture, incorrect placement, nerve damage, infection and bleeding   Alternatives discussed:  Delayed treatment, no treatment and alternative treatment Universal protocol:    Procedure explained and questions answered to patient or proxy's satisfaction: yes     Relevant documents present and verified: yes     Test results available: yes     Imaging studies available: yes     Required blood products, implants, devices, and special equipment available: yes     Site/side marked: yes     Immediately prior to procedure, a time out was called: yes     Patient identity confirmed:  Verbally with patient and arm band Pre-procedure details:    Indication(s): central venous access     Hand hygiene: Hand hygiene performed prior to insertion     Sterile barrier technique: All elements of maximal sterile technique followed     Skin preparation:  Chlorhexidine   Skin preparation agent: Skin preparation agent completely dried prior to procedure   Sedation:  Sedation type:  None Anesthesia:    Anesthesia method:  Local infiltration   Local anesthetic:  Lidocaine 1% w/o epi Procedure details:    Location:  R femoral   Patient position:  Supine   Procedural supplies:  Triple lumen   Catheter size:  5 Fr   Landmarks identified: yes     Ultrasound guidance: yes     Ultrasound guidance timing: prior to insertion     Sterile ultrasound techniques: Sterile gel and sterile probe covers were used     Number  of attempts:  1   Successful placement: yes   Post-procedure details:    Post-procedure:  Dressing applied and line sutured   Assessment:  Blood return through all ports   Procedure completion:  Tolerated well, no immediate complications Comments:     X-ray not indicated for femoral line.    MDM  Patient appears in no distress.  Unfortunate his blood pressures are in the 80s and 70s.  Antibiotics are infusing.  He still says he has some chest tightness and feels a little short of breath.  He is on oxygen.  We will give him another 500 fluid bolus.   Admission HPI from last admission 4/22 70 year old male with a past medical history of GERD, chronic pain syndrome, depression, anxiety, COPD, tobacco abuse, HFrEF with EF of 25%, ischemic cardiomyopathy s/p AICD, history of acute MI, hypertension, dyslipidemia, chronic kidney disease, tobacco abuse/depedence.Was admitted to [facility] 03/22/21 for septic shock due to aspiration pneumonia, AKI, polysubstance abuse (UDS +benzodiazepines,cocaine, opiates and THC), acute hypoxic respiratory failure requiring intubation, hyponatremia, metabolic acidosis. Was intubated for couple days, extubated 4/11 and reintubated 4/13 due to V.tach (ICD fired x2) c/w flash pulmonary edema and agitation, received amio bolus during event. OSH CXRBilateral lower lobe consolidations. Was on levophed for short period of time. Had AKI with creatinine peak 5.7 and potassium 5.3. OSHblood cultures NTD. MRSA nares negative. Has been on zosyn. Per report was discharged from their facility 5 days prior to his readmission due to acute heart failure exacerbation.     Terrilee Files, MD 05/13/21 [phone]

## 2021-05-12 NOTE — Progress Notes (Signed)
ANTICOAGULATION CONSULT NOTE - Initial Up Consult   Pharmacy Consult for warfarin dosing  Indication: LV thrombosis    No Known Allergies    Patient Measurements: Last Weight  Most recent update: 05/12/2021 11:41 AM   Weight  81.6 kg (180 lb)           Body mass index is 25.1 kg/m. Roger Mooney               Temp: 99.9 F (37.7 C) (05/30 1139) Temp Source: Oral (05/30 1139) BP: 95/63 (05/30 2010) Pulse Rate: 75 (05/30 2000)  Labs: Recent Labs    05/12/21 1201 05/12/21 1755  HGB 13.0  --   HCT 42.6  --   PLT 267  --   LABPROT  --  15.9*  INR  --  1.3*  CREATININE 1.62*  --     Estimated Creatinine Clearance: 45.8 mL/min (A) (by C-G formula based on SCr of 1.62 mg/dL (H)).     Medications:  (Not in a hospital admission)  Scheduled:  . atorvastatin  40 mg Oral q1800  . insulin aspart  0-5 Units Subcutaneous QHS  . [START ON 05/13/2021] insulin aspart  0-9 Units Subcutaneous TID WC  . pantoprazole  40 mg Oral Daily  . PARoxetine  20 mg Oral Daily   Infusions:  . [START ON 05/13/2021] ceFEPime (MAXIPIME) IV    . metronidazole    . [START ON 05/13/2021] vancomycin     PRN: acetaminophen **OR** acetaminophen, albuterol, ondansetron **OR** ondansetron (ZOFRAN) IV, polyethylene glycol Anti-infectives (From admission, onward)   Start     Dose/Rate Route Frequency Ordered Stop   05/13/21 0100  ceFEPIme (MAXIPIME) 2 g in sodium chloride 0.9 % 100 mL IVPB        2 g 200 mL/hr over 30 Minutes Intravenous Every 12 hours 05/12/21 1433     05/13/21 0100  vancomycin (VANCOREADY) IVPB 750 mg/150 mL        750 mg 150 mL/hr over 60 Minutes Intravenous Every 12 hours 05/12/21 1433     05/12/21 2200  metroNIDAZOLE (FLAGYL) IVPB 500 mg        500 mg 100 mL/hr over 60 Minutes Intravenous Every 8 hours 05/12/21 2103     05/12/21 1300  ceFEPIme (MAXIPIME) 2 g in sodium chloride 0.9 % 100 mL IVPB        2 g 200 mL/hr over 30 Minutes Intravenous  Once 05/12/21 1252 05/12/21  1443   05/12/21 1300  metroNIDAZOLE (FLAGYL) IVPB 500 mg        500 mg 100 mL/hr over 60 Minutes Intravenous  Once 05/12/21 1252 05/12/21 1546   05/12/21 1300  vancomycin (VANCOCIN) IVPB 1000 mg/200 mL premix        1,000 mg 200 mL/hr over 60 Minutes Intravenous  Once 05/12/21 1252 05/12/21 1752      Goal of Therapy:  INR 2-3 Monitor platelets by anticoagulation protocol: Yes    Prior to Admission Warfarin Dosing:  Roger Mooney takes 2mg  of warfarin daily, hasnt taken since last week   Admit INR was 1.3 Lab Results  Component Value Date   INR 1.3 (H) 05/12/2021    Assessment: Roger Mooney a 70 y.o. male requires anticoagulation with warfarin for the indication of  LV thombosis. Warfarin will be initiated inpatient following pharmacy protocol per pharmacy consult. Patient most recent blood work is as follows: CBC Latest Ref Rng & Units 05/12/2021  WBC 4.0 - 10.5 K/uL 21.3(H)  Hemoglobin 13.0 -  17.0 g/dL 28.4  Hematocrit 13.2 - 52.0 % 42.6  Platelets 150 - 400 K/uL 267     Plan: Warfarin 5mg  po once Monitor CBC MWF with am labs   Monitor INR daily Monitor for signs and symptoms of bleeding   Randel Hargens, PharmD, MBA, BCGP Clinical Pharmacist

## 2021-05-12 NOTE — ED Triage Notes (Signed)
Pt presents to ED via [facility] for mid chest pain started last night. Per EMS, sinus tach on EKG, 20 L FA, 1 Nitro and ASA 324 mg given PTA. No relief from Nitro.

## 2021-05-12 NOTE — ED Notes (Signed)
Asked if pt was in any pain at this time as well as explained importance of lying on his back due to central line in his right femoral. Pt is agitated and states "I am some but it don't matter yall aren't going to do shit for me". Nurse states she is attempting to make him comfortable would he give a number to his pain for which patient refused and asked nurse to "just leave me alone".

## 2021-05-12 NOTE — ED Notes (Signed)
Pt cleaned at this time due to watery BM. States he awoke and it was just "coming out"

## 2021-05-13 ENCOUNTER — Inpatient Hospital Stay (HOSPITAL_COMMUNITY): Payer: 59

## 2021-05-13 ENCOUNTER — Encounter (HOSPITAL_COMMUNITY): Payer: Self-pay | Admitting: Internal Medicine

## 2021-05-13 ENCOUNTER — Inpatient Hospital Stay: Payer: Self-pay

## 2021-05-13 DIAGNOSIS — R079 Chest pain, unspecified: Secondary | ICD-10-CM | POA: Diagnosis not present

## 2021-05-13 DIAGNOSIS — I959 Hypotension, unspecified: Secondary | ICD-10-CM

## 2021-05-13 DIAGNOSIS — I513 Intracardiac thrombosis, not elsewhere classified: Secondary | ICD-10-CM

## 2021-05-13 DIAGNOSIS — R0789 Other chest pain: Secondary | ICD-10-CM | POA: Diagnosis not present

## 2021-05-13 DIAGNOSIS — I10 Essential (primary) hypertension: Secondary | ICD-10-CM

## 2021-05-13 LAB — ECHOCARDIOGRAM COMPLETE
AR max vel: 3.53 cm2
AV Area VTI: 3.79 cm2
AV Area mean vel: 3.08 cm2
AV Mean grad: 3.1 mmHg
AV Peak grad: 5.4 mmHg
Ao pk vel: 1.16 m/s
Area-P 1/2: 4.6 cm2
Height: 71 in
S' Lateral: 5 cm
Weight: 2649.05 oz

## 2021-05-13 LAB — BASIC METABOLIC PANEL
Anion gap: 9 (ref 5–15)
BUN: 20 mg/dL (ref 8–23)
CO2: 19 mmol/L — ABNORMAL LOW (ref 22–32)
Calcium: 8.4 mg/dL — ABNORMAL LOW (ref 8.9–10.3)
Chloride: 107 mmol/L (ref 98–111)
Creatinine, Ser: 1.25 mg/dL — ABNORMAL HIGH (ref 0.61–1.24)
GFR, Estimated: >60 mL/min (ref >60)
Glucose, Bld: 109 mg/dL — ABNORMAL HIGH (ref 70–99)
Potassium: 4.2 mmol/L (ref 3.5–5.1)
Sodium: 135 mmol/L (ref 135–145)

## 2021-05-13 LAB — CBC
HCT: 40.2 % (ref 39.0–52.0)
Hemoglobin: 12.2 g/dL — ABNORMAL LOW (ref 13.0–17.0)
MCH: 25.3 pg — ABNORMAL LOW (ref 26.0–34.0)
MCHC: 30.3 g/dL (ref 30.0–36.0)
MCV: 83.4 fL (ref 80.0–100.0)
Platelets: 245 10*3/uL (ref 150–400)
RBC: 4.82 MIL/uL (ref 4.22–5.81)
RDW: 18.8 % — ABNORMAL HIGH (ref 11.5–15.5)
WBC: 18.2 10*3/uL — ABNORMAL HIGH (ref 4.0–10.5)
nRBC: 0 % (ref 0.0–0.2)

## 2021-05-13 LAB — PROTIME-INR
INR: 1.3 — ABNORMAL HIGH (ref 0.8–1.2)
Prothrombin Time: 16.6 seconds — ABNORMAL HIGH (ref 11.4–15.2)

## 2021-05-13 LAB — HIV ANTIBODY (ROUTINE TESTING W REFLEX): HIV Screen 4th Generation wRfx: NONREACTIVE

## 2021-05-13 LAB — MRSA PCR SCREENING: MRSA by PCR: NEGATIVE

## 2021-05-13 LAB — CBG MONITORING, ED: Glucose-Capillary: 91 mg/dL (ref 70–99)

## 2021-05-13 LAB — GLUCOSE, CAPILLARY
Glucose-Capillary: 188 mg/dL — ABNORMAL HIGH (ref 70–99)
Glucose-Capillary: 97 mg/dL (ref 70–99)
Glucose-Capillary: 114 mg/dL — ABNORMAL HIGH (ref 70–99)

## 2021-05-13 MED ORDER — PERFLUTREN LIPID MICROSPHERE
1.0000 mL | INTRAVENOUS | Status: AC | PRN
  Administered 2021-05-13: 3 mL via INTRAVENOUS

## 2021-05-13 MED ORDER — LOPERAMIDE HCL 2 MG PO CAPS
2.0000 mg | ORAL_CAPSULE | Freq: Once | ORAL | Status: AC
  Administered 2021-05-13: 2 mg via ORAL
  Filled 2021-05-13: qty 1

## 2021-05-13 MED ORDER — HEPARIN SODIUM (PORCINE) 5000 UNIT/ML IJ SOLN
5000.0000 [IU] | Freq: Three times a day (TID) | INTRAMUSCULAR | Status: DC
  Administered 2021-05-13 – 2021-05-16 (×9): 5000 [IU] via SUBCUTANEOUS
  Filled 2021-05-13 (×9): qty 1

## 2021-05-13 MED ORDER — OXYCODONE HCL 5 MG PO TABS
5.0000 mg | ORAL_TABLET | Freq: Four times a day (QID) | ORAL | Status: DC | PRN
  Administered 2021-05-13 – 2021-05-16 (×8): 5 mg via ORAL
  Filled 2021-05-13 (×8): qty 1

## 2021-05-13 MED ORDER — SODIUM CHLORIDE 0.9 % IV SOLN
2.0000 g | Freq: Three times a day (TID) | INTRAVENOUS | Status: DC
  Administered 2021-05-13 – 2021-05-16 (×8): 2 g via INTRAVENOUS
  Filled 2021-05-13 (×8): qty 2

## 2021-05-13 MED ORDER — CHLORHEXIDINE GLUCONATE CLOTH 2 % EX PADS
6.0000 | MEDICATED_PAD | Freq: Every day | CUTANEOUS | Status: DC
  Administered 2021-05-13 – 2021-05-16 (×4): 6 via TOPICAL

## 2021-05-13 MED ORDER — WARFARIN SODIUM 2 MG PO TABS: 4.0000 mg | ORAL_TABLET | Freq: Once | ORAL | Status: DC

## 2021-05-13 MED ORDER — COLCHICINE 0.6 MG PO TABS
0.6000 mg | ORAL_TABLET | Freq: Two times a day (BID) | ORAL | Status: DC
  Administered 2021-05-13 – 2021-05-16 (×7): 0.6 mg via ORAL
  Filled 2021-05-13 (×7): qty 1
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
## 2021-05-13 NOTE — Progress Notes (Signed)
ANTICOAGULATION CONSULT NOTE -    Pharmacy Consult for warfarin dosing  Indication: LV thrombosis    No Known Allergies    Patient Measurements: Last Weight  Most recent update: 05/13/2021  9:28 AM   Weight  75.1 kg (165 lb 9.1 oz)           Body mass index is 23.09 kg/m. [name]               Temp: 98.1 F (36.7 C) (05/31 0928) Temp Source: Oral (05/31 0928) BP: 110/63 (05/31 1000) Pulse Rate: 96 (05/31 1000)  Labs: Recent Labs    05/12/21 1201 05/12/21 1755 05/13/21 0447  HGB 13.0  --  12.2*  HCT 42.6  --  40.2  PLT 267  --  245  LABPROT  --  15.9* 16.6*  INR  --  1.3* 1.3*  CREATININE 1.62*  --  1.25*    Estimated Creatinine Clearance: 59.2 mL/min (A) (by C-G formula based on SCr of 1.25 mg/dL (H)).     Medications:  Medications Prior to Admission  Medication Sig Dispense Refill Last Dose  . aspirin 81 MG chewable tablet Chew 81 mg by mouth daily. Took 3-4 weeks ago   Past Month at Unknown time  . atorvastatin (LIPITOR) 40 MG tablet Take 1 tablet by mouth daily.   05/11/2021 at Unknown time  . isosorbide mononitrate (IMDUR) 30 MG 24 hr tablet Take 30 mg by mouth daily.   05/11/2021 at Unknown time  . lisinopril (ZESTRIL) 5 MG tablet Take 5 mg by mouth daily.   05/11/2021 at Unknown time  . metFORMIN (GLUCOPHAGE) 500 MG tablet Take 500 mg by mouth 2 (two) times daily.   05/11/2021 at Unknown time  . metoprolol succinate (TOPROL-XL) 25 MG 24 hr tablet Take 0.5 tablets by mouth daily.   05/11/2021 at 9-10am  . pantoprazole (PROTONIX) 40 MG tablet Take 40 mg by mouth daily.   05/11/2021 at Unknown time  . PARoxetine (PAXIL) 20 MG tablet Take 20 mg by mouth daily.   05/11/2021 at Unknown time  . warfarin (COUMADIN) 2 MG tablet Take 2 mg by mouth daily.   05/04/2021 at unk   Scheduled:  . atorvastatin  40 mg Oral q1800  . Chlorhexidine Gluconate Cloth  6 each Topical Q0600  . colchicine  0.6 mg Oral BID  . insulin aspart  0-5 Units Subcutaneous QHS  . insulin  aspart  0-9 Units Subcutaneous TID WC  . pantoprazole  40 mg Oral Daily  . PARoxetine  20 mg Oral Daily  . Warfarin - Pharmacist Dosing Inpatient   Does not apply q1600   Infusions:  . ceFEPime (MAXIPIME) IV 2 g (05/13/21 1039)  . metronidazole 500 mg (05/13/21 0557)  . vancomycin Stopped (05/13/21 0335)   PRN: acetaminophen **OR** acetaminophen, albuterol, ondansetron **OR** ondansetron (ZOFRAN) IV, polyethylene glycol Anti-infectives (From admission, onward)   Start     Dose/Rate Route Frequency Ordered Stop   05/13/21 0100  ceFEPIme (MAXIPIME) 2 g in sodium chloride 0.9 % 100 mL IVPB        2 g 200 mL/hr over 30 Minutes Intravenous Every 12 hours 05/12/21 1433     05/13/21 0100  vancomycin (VANCOREADY) IVPB 750 mg/150 mL        750 mg 150 mL/hr over 60 Minutes Intravenous Every 12 hours 05/12/21 1433     05/12/21 2200  metroNIDAZOLE (FLAGYL) IVPB 500 mg        500 mg 100 mL/hr  over 60 Minutes Intravenous Every 8 hours 05/12/21 2103     05/12/21 1300  ceFEPIme (MAXIPIME) 2 g in sodium chloride 0.9 % 100 mL IVPB        2 g 200 mL/hr over 30 Minutes Intravenous  Once 05/12/21 1252 05/12/21 1443   05/12/21 1300  metroNIDAZOLE (FLAGYL) IVPB 500 mg        500 mg 100 mL/hr over 60 Minutes Intravenous  Once 05/12/21 1252 05/12/21 1546   05/12/21 1300  vancomycin (VANCOCIN) IVPB 1000 mg/200 mL premix        1,000 mg 200 mL/hr over 60 Minutes Intravenous  Once 05/12/21 1252 05/12/21 1752      Goal of Therapy:  INR 2-3 Monitor platelets by anticoagulation protocol: Yes    Prior to Admission Warfarin Dosing:  Roger Mooney takes 2mg  of warfarin daily, hasnt taken since last week   Admit INR was 1.3 Lab Results  Component Value Date   INR 1.3 (H) 05/13/2021   INR 1.3 (H) 05/12/2021    Assessment: Roger Mooney a 70 y.o. male requires anticoagulation with warfarin for the indication of  LV thombosis. Warfarin will be initiated inpatient following pharmacy protocol per  pharmacy consult. Patient most recent blood work is as follows:  CBC Latest Ref Rng & Units 05/13/2021 05/12/2021  WBC 4.0 - 10.5 K/uL 18.2(H) 21.3(H)  Hemoglobin 13.0 - 17.0 g/dL 12.2(L) 13.0  Hematocrit 39.0 - 52.0 % 40.2 42.6  Platelets 150 - 400 K/uL 245 267     Plan: Warfarin 4 mg po once Monitor CBC MWF with am labs   Monitor INR daily Monitor for signs and symptoms of bleeding   05/14/2021, PharmD Clinical Pharmacist 05/13/2021 11:12 AM

## 2021-05-13 NOTE — ED Notes (Signed)
Pt to ICU 6

## 2021-05-13 NOTE — Progress Notes (Addendum)
PROGRESS NOTE   Roger Mooney  TDD:[id number] DOB: 1951-02-04 DOA: 05/12/2021 PCP: Pcp, No   Chief Complaint  Patient presents with  . Chest Pain   Level of care: Stepdown  Brief Admission History:  y.o. male with medical history significant for LV thrombus, chronic systolic heart failure EF of 25%, AICD, CKD, diabetes mellitus.  Patient presented to the ED with complaints of chest pain.  Patient reports onset of chest pain over the past 2 days, intermittent.  Reports improvement in chest pain for with pain medications, he is unaware of any aggravating factors.  Patient used to live in [place], moved to the area and lives with his granddaughter Roger Mooney.  Patient was recently hospitalized at [facility] for 4/14-4/41 and prior to that at [facility] [place] 4/9 through 4/14-where he was managed for respiratory failure secondary to aspiration pneumonia with septic shock and requiring mechanical ventilation and Levophed.  UDS was positive for benzos, cocaine, opiates and THC.  Hospitalization was complicated by development of vascular congestion/fluid overload requiring Lasix, ventricular tachycardia requiring amiodarone, on CKD with creatinine up to 1.5, discharge creatinine of 1.19.  An LV thrombus was found on echocardiogram 03/27/2021, requiring warfarin.  Patient required CIWA for alcohol withdrawal.  Patient was subsequently transferred to tertiary center[facility] for further care.  Assessment & Plan:   Principal Problem:   Hypotension Active Problems:   HTN (hypertension)   DM (diabetes mellitus) (HCC)   Chronic systolic CHF (congestive heart failure) (HCC)   LV (left ventricular) mural thrombus   AKI (acute kidney injury) (HCC)   Question of pericarditis - appreciate cardiology consultation, started on oral colchicine, awaiting 2D echocardiogram.    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  Holding futher  fluids for now per cardiology team.  Prognosis poor given poor compliance and ongoing recreational drug abuse. Consult to palliative care for goals of care.   Hypotension - improved with IV fluid hydration.  PICC line ordered to monitor CVP and COOX per Dr. Wyline Mooney.  BP has improved and IV fluids stopped.    AKI - improved with IV fluid hydration, follow BMP.   Type 2 DM controlled - holding home metformin, continue SSI coverage and CBG monitoring.   LV thrombus / pt is supposed to be on warfarin, INR 1.3, pharm D assisting with warfarin dosing.    Polysubstance abuse - USD positive for benzo and THC.  TOC consultation for substance abuse.    Leukocytosis - no clear source of infection found but is being empirically covered with antibiotics for now until we can rule out occult infection.  Follow CBC/diff.    DVT prophylaxis: warfarin  Code Status: full  Family Communication: plan of care discussed with patient at bedside Disposition: home  Status is: Inpatient  Remains inpatient appropriate because:Hemodynamically unstable, Persistent severe electrolyte disturbances, IV treatments appropriate due to intensity of illness or inability to take PO and Inpatient level of care appropriate due to severity of illness   Dispo: The patient is from: Home              Anticipated d/c is to: Home              Patient currently is not medically stable to d/c.   Difficult to place patient No   Consultants:   Cardiology  Palliative care   Procedures:   PICC placement tentative   Antimicrobials:  Cefepime 5/30> vanc  5/30  Subjective: Pt reports that he continues to have pain in chest wall, no shortness of breath   Objective: Vitals:   05/13/21 0556 05/13/21 0630 05/13/21 0700 05/13/21 0928  BP: 96/62 95/70 101/69 113/79  Pulse: 76 80 74 67  Resp: 20 (!) 21 (!) 22 19  Temp: 98.9 F (37.2 C)   98.1 F (36.7 C)  TempSrc: Oral   Oral  SpO2: 99% 100% 99% 97%  Weight:    75.1 kg   Height:    5\' 11"  (1.803 m)    Intake/Output Summary (Last 24 hours) at 05/13/2021 0954 Last data filed at 05/13/2021 0335 Gross per 24 hour  Intake 3544.08 ml  Output 287 ml  Net 3257.08 ml   Filed Weights   05/12/21 1141 05/13/21 0928  Weight: 81.6 kg 75.1 kg    Examination:  General exam: chronically ill appearing male, appears very weak, speaking short sentences, health literacy seems poor, Appears calm and comfortable  Respiratory system: shallow BS bilateral but clear. No increased work of breathing.  Cardiovascular system: normal S1 & S2 heard. mild JVD, no pedal or pretibial edema. Gastrointestinal system: Abdomen is nondistended, soft and nontender. No organomegaly or masses felt. Normal bowel sounds heard. Central nervous system: Alert and oriented. No focal neurological deficits. Extremities: Symmetric 5 x 5 power. Skin: No rashes, lesions or ulcers Psychiatry: Judgement and insight appear poor. Mooney & affect flat.   Data Reviewed: I have personally reviewed following labs and imaging studies  CBC: Recent Labs  Lab 05/12/21 1201 05/13/21 0447  WBC 21.3* 18.2*  NEUTROABS 18.3*  --   HGB 13.0 12.2*  HCT 42.6 40.2  MCV 81.9 83.4  PLT 267 245    Basic Metabolic Panel: Recent Labs  Lab 05/12/21 1201 05/13/21 0447  NA 132* 135  K 4.4 4.2  CL 103 107  CO2 23 19*  GLUCOSE 144* 109*  BUN 23 20  CREATININE 1.62* 1.25*  CALCIUM 8.4* 8.4*    GFR: Estimated Creatinine Clearance: 59.2 mL/min (A) (by C-G formula based on SCr of 1.25 mg/dL (H)).  Liver Function Tests: Recent Labs  Lab 05/12/21 1201  AST 16  ALT 13  ALKPHOS 55  BILITOT 3.1*  PROT 7.5  ALBUMIN 3.5    CBG: Recent Labs  Lab 05/12/21 2136 05/13/21 0754  GLUCAP 89 91    Recent Results (from the past 240 hour(s))  Culture, blood (routine x 2)     Status: None (Preliminary result)   Collection Time: 05/12/21 12:02 PM   Specimen: Right Antecubital; Blood  Result Value Ref Range  Status   Specimen Description RIGHT ANTECUBITAL  Final   Special Requests   Final    BOTTLES DRAWN AEROBIC AND ANAEROBIC Blood Culture adequate volume   Culture   Final    NO GROWTH < 24 HOURS Performed at [facility], [address] [place], [place] [address]    Report Status PENDING  Incomplete  Resp Panel by RT-PCR (Flu A&B, Covid) Nasopharyngeal Swab     Status: None   Collection Time: 05/12/21 12:03 PM   Specimen: Nasopharyngeal Swab; Nasopharyngeal(NP) swabs in vial transport medium  Result Value Ref Range Status   SARS Coronavirus 2 by RT PCR NEGATIVE NEGATIVE Final    Comment: (NOTE) SARS-CoV-2 target nucleic acids are NOT DETECTED.  The SARS-CoV-2 RNA is generally detectable in upper respiratory specimens during the acute phase of infection. The lowest concentration of SARS-CoV-2 viral copies this assay can detect is 138 copies/mL. A  negative result does not preclude SARS-Cov-2 infection and should not be used as the sole basis for treatment or other patient management decisions. A negative result may occur with  improper specimen collection/handling, submission of specimen other than nasopharyngeal swab, presence of viral mutation(s) within the areas targeted by this assay, and inadequate number of viral copies(<138 copies/mL). A negative result must be combined with clinical observations, patient history, and epidemiological information. The expected result is Negative.  Fact Sheet for Patients:  BloggerCourse.comhttps://www.fda.gov/media/152166/download  Fact Sheet for Healthcare Providers:  SeriousBroker.ithttps://www.fda.gov/media/152162/download  This test is no t yet approved or cleared by the [place] FDA and  has been authorized for detection and/or diagnosis of SARS-CoV-2 by FDA under an Emergency Use Authorization (EUA). This EUA will remain  in effect (meaning this test can be used) for the duration of the COVID-19 declaration under Section 564(b)(1) of the Act, 21 U.S.C.section  360bbb-3(b)(1), unless the authorization is terminated  or revoked sooner.       Influenza A by PCR NEGATIVE NEGATIVE Final   Influenza B by PCR NEGATIVE NEGATIVE Final    Comment: (NOTE) The Xpert Xpress SARS-CoV-2/FLU/RSV plus assay is intended as an aid in the diagnosis of influenza from Nasopharyngeal swab specimens and should not be used as a sole basis for treatment. Nasal washings and aspirates are unacceptable for Xpert Xpress SARS-CoV-2/FLU/RSV testing.  Fact Sheet for Patients: BloggerCourse.comhttps://www.fda.gov/media/152166/download  Fact Sheet for Healthcare Providers: SeriousBroker.ithttps://www.fda.gov/media/152162/download  This test is not yet approved or cleared by the [place] FDA and has been authorized for detection and/or diagnosis of SARS-CoV-2 by FDA under an Emergency Use Authorization (EUA). This EUA will remain in effect (meaning this test can be used) for the duration of the COVID-19 declaration under Section 564(b)(1) of the Act, 21 U.S.C. section 360bbb-3(b)(1), unless the authorization is terminated or revoked.  Performed at [facility], [address] [place], [place] [address]   Culture, blood (routine x 2)     Status: None (Preliminary result)   Collection Time: 05/12/21  1:13 PM   Specimen: Right Antecubital; Blood  Result Value Ref Range Status   Specimen Description   Final    RIGHT ANTECUBITAL BOTTLES DRAWN AEROBIC AND ANAEROBIC   Special Requests Blood Culture adequate volume  Final   Culture   Final    NO GROWTH < 24 HOURS Performed at [facility], [address] [place], [place] [address]    Report Status PENDING  Incomplete     Radiology Studies: CT Angio Chest PE W and/or Wo Contrast  Result Date: 05/12/2021 CLINICAL DATA:  70 year old male with history of central chest pain and shortness of breath. EXAM: CT ANGIOGRAPHY CHEST WITH CONTRAST TECHNIQUE: Multidetector CT imaging of the chest was performed using the standard protocol during bolus  administration of intravenous contrast. Multiplanar CT image reconstructions and MIPs were obtained to evaluate the vascular anatomy. CONTRAST:  100mL OMNIPAQUE IOHEXOL 350 MG/ML SOLN COMPARISON:  No priors. FINDINGS: Cardiovascular: No filling defects within the pulmonary arterial to suggest pulmonary emboli. Heart size is normal. Extensive pericardial thickening and small volume of pericardial fluid. No pericardial calcification. There is aortic atherosclerosis, as well as atherosclerosis of the great vessels of the mediastinum and the coronary arteries, including calcified atherosclerotic plaque in the left main, left anterior descending, left circumflex and right coronary arteries. Proximal left anterior descending coronary artery stent. Left-sided pacemaker device in place with lead tips terminating in the right atrium and right ventricular apex. Mediastinum/Nodes: No pathologically enlarged mediastinal  or hilar lymph nodes. Esophagus is unremarkable in appearance. No axillary lymphadenopathy. Lungs/Pleura: Study is limited by extensive patient respiratory motion. With these limitations in mind, there are no definite suspicious appearing pulmonary nodules or masses are noted. Patchy areas of ground-glass attenuation and septal thickening with thickening of the peribronchovascular interstitium and regional architectural distortion, most evident throughout the mid to upper lungs, concerning for potential interstitial lung disease such as hypersensitivity pneumonitis. No acute consolidative airspace disease. Trace right pleural effusion. No left pleural effusion. Upper Abdomen: Aortic atherosclerosis. Musculoskeletal: There are no aggressive appearing lytic or blastic lesions noted in the visualized portions of the skeleton. Review of the MIP images confirms the above findings. IMPRESSION: 1. No evidence of pulmonary embolism. 2. Extensive pericardial thickening and small volume of pericardial fluid. Clinical  correlation for signs and symptoms of acute pericarditis is recommended. No pericardial calcification noted at this time. 3. Trace right pleural effusion. 4. The appearance of the lungs is suggestive of potential interstitial lung disease such as chronic hypersensitivity pneumonitis. Follow-up nonemergent high-resolution chest CT is recommended in 6 months to assess for temporal changes in the appearance of the lung parenchyma. Additionally, nonemergent outpatient referral to Pulmonology is suggested in the near future for further clinical evaluation. 5. Aortic atherosclerosis, in addition to left main and 3 vessel coronary artery disease. Please note that although the presence of coronary artery calcium documents the presence of coronary artery disease, the severity of this disease and any potential stenosis cannot be assessed on this non-gated CT examination. Assessment for potential risk factor modification, dietary therapy or pharmacologic therapy may be warranted, if clinically indicated. Aortic Atherosclerosis (ICD10-I70.0). Electronically Signed   By: Trudie Reed M.D.   On: 05/12/2021 15:39   DG Chest Portable 1 View  Result Date: 05/12/2021 CLINICAL DATA:  70 year old male with history of chest pain. EXAM: PORTABLE CHEST 1 VIEW COMPARISON:  No priors. FINDINGS: Widespread areas of interstitial prominence and diffuse peribronchial cuffing. Lung volumes are normal. No consolidative airspace disease. No pleural effusions. No pneumothorax. No pulmonary nodule or mass noted. Pulmonary vasculature and the cardiomediastinal silhouette are within normal limits. Left-sided pacemaker/AICD in place with lead tips projecting over the expected location of the right atrium and right ventricle. IMPRESSION: 1. The appearance of the chest suggests bronchitis, as above. Electronically Signed   By: Trudie Reed M.D.   On: 05/12/2021 12:38   [place] EKG SITE RITE  Result Date: 05/13/2021 If Site Rite image not  attached, placement could not be confirmed due to current cardiac rhythm.   Scheduled Meds: . atorvastatin  40 mg Oral q1800  . Chlorhexidine Gluconate Cloth  6 each Topical Q0600  . colchicine  0.6 mg Oral BID  . insulin aspart  0-5 Units Subcutaneous QHS  . insulin aspart  0-9 Units Subcutaneous TID WC  . pantoprazole  40 mg Oral Daily  . PARoxetine  20 mg Oral Daily  . Warfarin - Pharmacist Dosing Inpatient   Does not apply q1600   Continuous Infusions: . ceFEPime (MAXIPIME) IV Stopped (05/13/21 0138)  . metronidazole 500 mg (05/13/21 0557)  . vancomycin Stopped (05/13/21 0335)     LOS: 1 day   Time spent: 40 mins   Kmari Halter Laural Benes, MD How to contact the [facility] Attending or Consulting provider 7A - 7P or covering provider during after hours 7P -7A, for this patient?  1. Check the care team in [facility] and look for a) attending/consulting TRH provider listed and b) the [facility] team  listed 2. Log into [email or web address] and use [facility]'s universal password to access. If you do not have the password, please contact the hospital operator. 3. Locate the [facility], The provider you are looking for under [facility] and page to a number that you can be directly reached. 4. If you still have difficulty reaching the provider, please page the [facility] (Director on Call) for the Hospitalists listed on amion for assistance.  05/13/2021, 9:54 AM

## 2021-05-13 NOTE — Progress Notes (Signed)
*  PRELIMINARY RESULTS* Echocardiogram 2D Echocardiogram has been performed with Definity.  Stacey Drain 05/13/2021, 12:50 PM

## 2021-05-13 NOTE — ED Notes (Signed)
Pt given water 

## 2021-05-14 ENCOUNTER — Inpatient Hospital Stay (HOSPITAL_COMMUNITY): Payer: 59

## 2021-05-14 ENCOUNTER — Encounter (HOSPITAL_COMMUNITY): Payer: Self-pay | Admitting: Internal Medicine

## 2021-05-14 DIAGNOSIS — I959 Hypotension, unspecified: Secondary | ICD-10-CM | POA: Diagnosis not present

## 2021-05-14 DIAGNOSIS — Z515 Encounter for palliative care: Secondary | ICD-10-CM

## 2021-05-14 DIAGNOSIS — Z7189 Other specified counseling: Secondary | ICD-10-CM

## 2021-05-14 LAB — GASTROINTESTINAL PANEL BY PCR, STOOL (REPLACES STOOL CULTURE)
Adenovirus F40/41: NOT DETECTED
Astrovirus: NOT DETECTED
Campylobacter species: NOT DETECTED
Cryptosporidium: NOT DETECTED
Cyclospora cayetanensis: NOT DETECTED
Entamoeba histolytica: NOT DETECTED
Enteroaggregative E coli (EAEC): DETECTED — AB
Enteropathogenic E coli (EPEC): DETECTED — AB
Enterotoxigenic E coli (ETEC): NOT DETECTED
Giardia lamblia: NOT DETECTED
Norovirus GI/GII: NOT DETECTED
Plesimonas shigelloides: NOT DETECTED
Rotavirus A: NOT DETECTED
Salmonella species: NOT DETECTED
Sapovirus (I, II, IV, and V): NOT DETECTED
Shiga like toxin producing E coli (STEC): NOT DETECTED
Shigella/Enteroinvasive E coli (EIEC): NOT DETECTED
Vibrio cholerae: NOT DETECTED
Vibrio species: NOT DETECTED
Yersinia enterocolitica: NOT DETECTED

## 2021-05-14 LAB — CORTISOL-AM, BLOOD: Cortisol - AM: 14.8 ug/dL (ref 6.7–22.6)

## 2021-05-14 LAB — CBC WITH DIFFERENTIAL/PLATELET
Abs Immature Granulocytes: 0.16 10*3/uL — ABNORMAL HIGH (ref 0.00–0.07)
Basophils Absolute: 0.1 10*3/uL (ref 0.0–0.1)
Basophils Relative: 1 %
Eosinophils Absolute: 0.1 10*3/uL (ref 0.0–0.5)
Eosinophils Relative: 1 %
HCT: 36.8 % — ABNORMAL LOW (ref 39.0–52.0)
Hemoglobin: 11.2 g/dL — ABNORMAL LOW (ref 13.0–17.0)
Immature Granulocytes: 1 %
Lymphocytes Relative: 14 %
Lymphs Abs: 2.1 10*3/uL (ref 0.7–4.0)
MCH: 25.6 pg — ABNORMAL LOW (ref 26.0–34.0)
MCHC: 30.4 g/dL (ref 30.0–36.0)
MCV: 84.2 fL (ref 80.0–100.0)
Monocytes Absolute: 1.5 10*3/uL — ABNORMAL HIGH (ref 0.1–1.0)
Monocytes Relative: 10 %
Neutro Abs: 11.3 10*3/uL — ABNORMAL HIGH (ref 1.7–7.7)
Neutrophils Relative %: 73 %
Platelets: 227 10*3/uL (ref 150–400)
RBC: 4.37 MIL/uL (ref 4.22–5.81)
RDW: 18.6 % — ABNORMAL HIGH (ref 11.5–15.5)
WBC: 15.2 10*3/uL — ABNORMAL HIGH (ref 4.0–10.5)
nRBC: 0 % (ref 0.0–0.2)

## 2021-05-14 LAB — COOXEMETRY PANEL
Carboxyhemoglobin: 1 % (ref 0.5–1.5)
Methemoglobin: 1 % (ref 0.0–1.5)
O2 Saturation: 63.3 %
Total hemoglobin: 11.3 g/dL — ABNORMAL LOW (ref 12.0–16.0)

## 2021-05-14 LAB — GLUCOSE, CAPILLARY
Glucose-Capillary: 104 mg/dL — ABNORMAL HIGH (ref 70–99)
Glucose-Capillary: 130 mg/dL — ABNORMAL HIGH (ref 70–99)
Glucose-Capillary: 77 mg/dL (ref 70–99)
Glucose-Capillary: 72 mg/dL (ref 70–99)

## 2021-05-14 LAB — URINE CULTURE: Culture: NO GROWTH

## 2021-05-14 LAB — C-REACTIVE PROTEIN: CRP: 18.9 mg/dL — ABNORMAL HIGH (ref <1.0)

## 2021-05-14 LAB — CREATININE, SERUM
Creatinine, Ser: 1.13 mg/dL (ref 0.61–1.24)
GFR, Estimated: >60 mL/min (ref >60)

## 2021-05-14 LAB — PROCALCITONIN: Procalcitonin: 0.11 ng/mL

## 2021-05-14 LAB — SEDIMENTATION RATE: Sed Rate: 42 mm/hr — ABNORMAL HIGH (ref 0–16)

## 2021-05-14 LAB — BRAIN NATRIURETIC PEPTIDE: B Natriuretic Peptide: 106 pg/mL — ABNORMAL HIGH (ref 0.0–100.0)

## 2021-05-14 MED ORDER — SODIUM CHLORIDE 0.9% FLUSH
10.0000 mL | Freq: Two times a day (BID) | INTRAVENOUS | Status: DC
  Administered 2021-05-14 – 2021-05-16 (×4): 10 mL

## 2021-05-14 MED ORDER — SODIUM CHLORIDE 0.9% FLUSH: 10.0000 mL | INTRAVENOUS | Status: DC | PRN

## 2021-05-14 MED ORDER — SODIUM CHLORIDE 0.9 % IV SOLN
INTRAVENOUS | Status: DC | PRN
  Administered 2021-05-14: 500 mL via INTRAVENOUS

## 2021-05-14 MED ORDER — AZITHROMYCIN 250 MG PO TABS
500.0000 mg | ORAL_TABLET | Freq: Every day | ORAL | Status: AC
  Administered 2021-05-14 – 2021-05-16 (×3): 500 mg via ORAL
  Filled 2021-05-14 (×3): qty 2

## 2021-05-14 NOTE — Progress Notes (Signed)
PROGRESS NOTE   Roger Mooney  AYT:[id number] DOB: 16-Apr-1951 DOA: 05/12/2021 PCP: Pcp, No   Chief Complaint  Patient presents with  . Chest Pain   Level of care: Stepdown  Brief Admission History:  70 y.o. male with medical history significant for LV thrombus, chronic systolic heart failure EF of 25%, AICD, CKD, diabetes mellitus.  Patient presented to the ED with complaints of chest pain.  Patient reports onset of chest pain over the past 2 days, intermittent.  Reports improvement in chest pain for with pain medications, he is unaware of any aggravating factors.  Patient used to live in [place], moved to the area and lives with his granddaughter Roger Mooney.  Patient was recently hospitalized at [facility] for 4/14-4/41 and prior to that at [place] 4/9 through 4/14-where he was managed for respiratory failure secondary to aspiration pneumonia with septic shock and requiring mechanical ventilation and Levophed.  UDS was positive for benzos, cocaine, opiates and THC.  Hospitalization was complicated by development of vascular congestion/fluid overload requiring Lasix, ventricular tachycardia requiring amiodarone, on CKD with creatinine up to 1.5, discharge creatinine of 1.19.  An LV thrombus was found on echocardiogram 03/27/2021, requiring warfarin.  Patient required CIWA for alcohol withdrawal.  Patient was subsequently transferred to tertiary center[facility] for further care.  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  2)Severe sepsis with septic shock --- blood and urine cultures from 05/12/21 NGTD -Stool culture/GI pathogen from 05/13/2021 with both enteroaggregate of E. coli AND enteropathogenic E. Coli -Azithromycin 500 mg daily for 3 days for E. coli in stool as above -Sepsis pathophysiology noted(patient initially had tachypnea, leukocytosis, tachycardia hypotension and hypoxia),  currently on Vanco, cefepime and Flagyl---we will stop IV Vanco, Flagyl and cefepime after 05/14/2021 if blood cultures still negative CRP 14.3 >> 18.9 ESR 31>>42 WBC 21.3 >>18.2>>15.2 -BP improved after aggressive IV fluids initially no further IV fluids given very low EF -Given low EF his BP may run low -Needs PICC line or IJ/[place] central line in order to allow for CVP and COOX    3)Social/Ethics --patient with ongoing recreational drug use including cocaine, significant comorbidities and high risk for decompensation  -Palliative consult appreciated patient is a full code  4)Hypotension--- suspect this is mostly due to #2 above, #1 above is contributory -AM cortisol 14.8 --Given low EF his BP may run low -Needs PICC line or IJ/[place] central line in order to allow for CVP and COOX   5)AKI----acute kidney injury -resolved with hydration and improvement in BP -   creatinine on admission= 1.62 , baseline creatinine = wnl    ,  -creatinine is now=1.1  ,  --renally adjust medications, avoid nephrotoxic agents / dehydration  / hypotension  6)DM2--continue to hold metformin Use Novolog/Humalog Sliding scale insulin with Accu-Cheks/Fingersticks as ordered   7)LV  thrombus--discharged from [facility] March 27, 2021 on Coumadin, patient not compliant, INR was  subtherapeutic repeat echo without LV thrombus, cardiology recommends no further anticoagulation especially given noncompliance  8)Polysubstance abuse - USD positive for benzo and THC.  -Patient admits to ongoing cocaine use  9)PULM--- possible ILD, please see CT chest findings -Patient will need to follow-up pulmonologist as outpatient, patient may need high-resolution CT chest  10)H/o CAD--status post prior LAD stent--- last LHC 12/02/2020, with patent vessels apparently -Given ongoing cocaine abuse and history of noncompliance cardiology team does not believe patient is a good candidate for further invasive testing at this time -Patient has ruled out for ACS by cardiac enzymes and EKG at this time    DVT prophylaxis: warfarin  Code Status: full  Family Communication: plan of care discussed with patient at bedside Disposition: home  Status is: Inpatient  Remains inpatient appropriate because:Concerns for sepsis with septic shock, currently on IV antibiotics, also hemodynamic instability with low EF requires further cardiovascular monitoring and interventions   Dispo: The patient is from: Home              Anticipated d/c is to: Home              Patient currently is not medically stable to d/c.   Difficult to place patient No   Consultants:   Cardiology  Palliative care   Procedures:   PICC placement 05/14/21  Antimicrobials:  Cefepime 5/30> vanc  5/30  Subjective: -Patient threatening to leave AMA, chaplain able to talk to patient, currently patient is calm -No fever  Or chills   No Nausea or Vomiting  No further Diarrhea   Objective: Vitals:   05/14/21 1000 05/14/21 1100 05/14/21 1200 05/14/21 1300  BP: (!) 80/50  109/68 (!) 81/40  Pulse: 71 75 68 68  Resp: (!) 21 16 19  (!) 22  Temp:      TempSrc:      SpO2: 99% 100% 100% 100%  Weight:      Height:        Intake/Output Summary (Last 24 hours) at 05/14/2021 1405 Last data filed at 05/14/2021 0854 Gross per  24 hour  Intake 1402.72 ml  Output 650 ml  Net 752.72 ml   Filed Weights   05/12/21 1141 05/13/21 0928 05/14/21 0500  Weight: 81.6 kg 75.1 kg 78.7 kg    Examination:   Physical Exam  Gen:- Awake Alert,  In no apparent distress, chronically ill-appearing, speaking in complete sentences HEENT:- [place].AT, No sclera icterus Neck-Supple Neck,No JVD,.  Lungs-mostly clear, air movement is fair CV- S1, S2 normal, RR  Abd-  +ve B.Sounds, Abd Soft, No tenderness,    Extremity/Skin:- No  edema,   good pulses Psych-affect is appropriate, oriented x3 Neuro-no new focal deficits, no tremors   Data Reviewed: I have personally reviewed following labs and imaging studies  CBC: Recent Labs  Lab 05/12/21 1201 05/13/21 0447 05/14/21 0432  WBC 21.3* 18.2* 15.2*  NEUTROABS 18.3*  --  11.3*  HGB 13.0 12.2* 11.2*  HCT 42.6 40.2 36.8*  MCV 81.9 83.4 84.2  PLT 267 245 408    Basic Metabolic Panel: Recent Labs  Lab 05/12/21 1201 05/13/21 0447 05/14/21 0432  NA 132* 135  --   K 4.4 4.2  --   CL 103 107  --   CO2 23 19*  --   GLUCOSE 144* 109*  --   BUN 23 20  --   CREATININE 1.62* 1.25* 1.13  CALCIUM 8.4* 8.4*  --     GFR: Estimated Creatinine Clearance: 65.7 mL/min (by C-G formula based on SCr of 1.13 mg/dL).  Liver Function Tests: Recent Labs  Lab 05/12/21 1201  AST 16  ALT 13  ALKPHOS 55  BILITOT 3.1*  PROT 7.5  ALBUMIN 3.5    CBG: Recent Labs  Lab 05/13/21 1114 05/13/21 1633 05/13/21 2124 05/14/21 0744 05/14/21 1113  GLUCAP 188* 97 114* 104* 130*    Recent Results (from the past 240 hour(s))  Culture, blood (routine x 2)     Status: None (Preliminary result)   Collection Time: 05/12/21 12:02 PM   Specimen: Right Antecubital; Blood  Result Value Ref Range Status   Specimen Description RIGHT ANTECUBITAL  Final   Special Requests   Final    BOTTLES DRAWN AEROBIC AND ANAEROBIC Blood Culture adequate volume   Culture   Final    NO GROWTH 2 DAYS Performed  at [facility], [address] [place], [place] [address]    Report Status PENDING  Incomplete  Resp Panel by RT-PCR (Flu A&B, Covid) Nasopharyngeal Swab     Status: None   Collection Time: 05/12/21 12:03 PM   Specimen: Nasopharyngeal Swab; Nasopharyngeal(NP) swabs in vial transport medium  Result Value Ref Range Status   SARS Coronavirus 2 by RT PCR NEGATIVE NEGATIVE Final    Comment: (NOTE) SARS-CoV-2 target nucleic acids are NOT DETECTED.  The SARS-CoV-2 RNA is generally detectable in upper respiratory specimens during the acute phase of infection. The lowest concentration of SARS-CoV-2 viral copies this assay can detect is 138 copies/mL. A negative result does not preclude SARS-Cov-2 infection and should not be used as the sole basis for treatment or other patient management decisions. A negative result may occur with  improper specimen collection/handling, submission of specimen other than nasopharyngeal swab, presence of viral mutation(s) within the areas targeted by this assay, and inadequate number of viral copies(<138 copies/mL). A negative result must be combined with clinical observations, patient history, and epidemiological information. The expected result is Negative.  Fact Sheet for Patients:  EntrepreneurPulse.com.au  Fact Sheet for Healthcare Providers:  IncredibleEmployment.be  This test is no t yet approved or cleared by the [place] FDA and  has been authorized for detection and/or diagnosis of SARS-CoV-2 by FDA under an Emergency Use Authorization (EUA). This EUA will remain  in effect (meaning this test can be used) for the duration of the COVID-19 declaration under Section 564(b)(1) of the Act, 21 U.S.C.section 360bbb-3(b)(1), unless the authorization is terminated  or revoked sooner.       Influenza A by PCR NEGATIVE NEGATIVE Final   Influenza B by PCR NEGATIVE NEGATIVE Final    Comment: (NOTE) The Xpert  Xpress SARS-CoV-2/FLU/RSV plus assay is intended as an aid in the diagnosis of influenza from Nasopharyngeal swab specimens and should not be used as a sole basis for treatment. Nasal washings and aspirates are unacceptable for Xpert Xpress SARS-CoV-2/FLU/RSV testing.  Fact Sheet for Patients: EntrepreneurPulse.com.au  Fact Sheet for Healthcare Providers: IncredibleEmployment.be  This test is not yet approved or cleared by the [place] FDA and has been authorized for detection and/or diagnosis of SARS-CoV-2 by FDA under an Emergency Use Authorization (EUA). This EUA will remain in effect (meaning this test can be used) for the duration of the COVID-19 declaration under Section 564(b)(1) of the Act, 21 U.S.C. section 360bbb-3(b)(1), unless the authorization is terminated or revoked.  Performed at [facility], [address] [place],  [place] [address]   Culture, blood (routine x 2)     Status: None (Preliminary result)   Collection Time: 05/12/21  1:13 PM   Specimen: Right Antecubital; Blood  Result Value Ref Range Status   Specimen Description   Final    RIGHT ANTECUBITAL BOTTLES DRAWN AEROBIC AND ANAEROBIC   Special Requests Blood Culture adequate volume  Final   Culture   Final    NO GROWTH 2 DAYS Performed at [facility], [address], [place], [place] [address]    Report Status PENDING  Incomplete  Culture, Urine     Status: None   Collection Time: 05/12/21  9:55 PM   Specimen: Urine, Clean Catch  Result Value Ref Range Status   Specimen Description   Final    URINE, CLEAN CATCH Performed at [facility], [address], [place], [place] [address]    Special Requests   Final    NONE Performed at [facility], [address], [place], [place] [address]    Culture   Final    NO GROWTH Performed at [facility], [place] [address], [place], [place] [address]    Report Status 05/14/2021 FINAL  Final  Gastrointestinal Panel  by PCR , Stool     Status: Abnormal   Collection Time: 05/13/21  2:17 AM   Specimen: Stool  Result Value Ref Range Status   Campylobacter species NOT DETECTED NOT DETECTED Final   Plesimonas shigelloides NOT DETECTED NOT DETECTED Final   Salmonella species NOT DETECTED NOT DETECTED Final   Yersinia enterocolitica NOT DETECTED NOT DETECTED Final   Vibrio species NOT DETECTED NOT DETECTED Final   Vibrio cholerae NOT DETECTED NOT DETECTED Final   Enteroaggregative E coli (EAEC) DETECTED (A) NOT DETECTED Final    Comment: RESULT CALLED TO, READ BACK BY AND VERIFIED WITH: JESSICA HEARN @0044  ON 05/14/21 SKL    Enteropathogenic E coli (EPEC) DETECTED (A) NOT DETECTED Final    Comment: RESULT CALLED TO, READ BACK BY AND VERIFIED WITH: JESSICA HEARN @0044  ON 05/14/21 SKL    Enterotoxigenic E coli (ETEC) NOT DETECTED NOT DETECTED Final   Shiga like toxin producing E coli (STEC) NOT DETECTED NOT DETECTED Final   Shigella/Enteroinvasive E coli (EIEC) NOT DETECTED NOT DETECTED Final   Cryptosporidium NOT DETECTED NOT DETECTED Final   Cyclospora cayetanensis NOT DETECTED NOT DETECTED Final   Entamoeba histolytica NOT DETECTED NOT DETECTED Final   Giardia lamblia NOT DETECTED NOT DETECTED Final   Adenovirus F40/41 NOT DETECTED NOT DETECTED Final   Astrovirus NOT DETECTED NOT DETECTED Final   Norovirus GI/GII NOT DETECTED NOT DETECTED Final   Rotavirus A NOT DETECTED NOT DETECTED Final   Sapovirus (I, II, IV, and V) NOT DETECTED NOT DETECTED Final    Comment: Performed at [facility], [address] [place], [place] [address]  MRSA PCR Screening     Status: None   Collection Time: 05/13/21  9:30 AM   Specimen: Nasopharyngeal  Result Value Ref Range Status   MRSA by PCR NEGATIVE NEGATIVE Final    Comment:        The GeneXpert MRSA Assay (FDA approved for NASAL specimens only), is one component of a comprehensive MRSA colonization surveillance program. It is not intended to diagnose  MRSA infection nor to guide or monitor treatment for MRSA infections. Performed at [facility], [address] [place], [place] [address]      Radiology Studies: CT Angio Chest PE W and/or Wo Contrast  Result Date: 05/12/2021 CLINICAL DATA:  70 year old male with history of central chest pain and shortness of breath. EXAM: CT ANGIOGRAPHY CHEST WITH CONTRAST TECHNIQUE: Multidetector CT imaging of the chest was performed using the standard protocol during bolus administration of intravenous contrast. Multiplanar CT image reconstructions and MIPs were obtained to evaluate the vascular anatomy. CONTRAST:  149m OMNIPAQUE IOHEXOL 350 MG/ML SOLN COMPARISON:  No priors. FINDINGS: Cardiovascular: No filling defects within the pulmonary arterial to suggest pulmonary emboli. Heart size is normal. Extensive pericardial thickening and small volume of pericardial fluid. No pericardial calcification. There is aortic atherosclerosis, as well as atherosclerosis of the great vessels of the mediastinum and the coronary arteries, including calcified atherosclerotic plaque in the left main, left anterior descending, left circumflex and right coronary arteries. Proximal left anterior descending coronary artery stent. Left-sided pacemaker device in place with lead tips terminating in the right atrium and right ventricular apex. Mediastinum/Nodes: No pathologically enlarged mediastinal or hilar lymph nodes. Esophagus is unremarkable in appearance. No axillary lymphadenopathy. Lungs/Pleura: Study is limited by extensive patient respiratory motion. With these limitations in mind, there are no definite suspicious appearing pulmonary nodules or masses are noted. Patchy areas of ground-glass attenuation and septal thickening with thickening of the peribronchovascular interstitium and regional architectural distortion, most evident throughout the mid to upper lungs, concerning for potential interstitial lung disease such as  hypersensitivity pneumonitis. No acute consolidative airspace disease. Trace right pleural effusion. No left pleural effusion. Upper Abdomen: Aortic atherosclerosis. Musculoskeletal: There are no aggressive appearing lytic or blastic lesions noted in the visualized portions of the skeleton. Review of the MIP images confirms the above findings. IMPRESSION: 1. No evidence of pulmonary embolism. 2. Extensive pericardial thickening and small volume of pericardial fluid. Clinical correlation for signs and symptoms of acute pericarditis is recommended. No pericardial calcification noted at this time. 3. Trace right pleural effusion. 4. The appearance of the lungs is suggestive of potential interstitial lung disease such as chronic hypersensitivity pneumonitis. Follow-up nonemergent high-resolution chest CT is recommended in 6 months to assess for temporal changes in the appearance of the lung parenchyma. Additionally, nonemergent outpatient referral to Pulmonology is suggested in the near future for further clinical evaluation. 5. Aortic atherosclerosis, in addition to left main and 3 vessel coronary artery disease. Please note that although the presence of coronary artery calcium documents the presence of coronary artery disease, the severity of this disease and any potential stenosis cannot be assessed on this non-gated CT examination. Assessment for potential risk factor modification, dietary therapy or pharmacologic therapy may be warranted, if clinically indicated. Aortic Atherosclerosis (ICD10-I70.0). Electronically Signed   By: DVinnie LangtonM.D.   On: 05/12/2021 15:39   ECHOCARDIOGRAM COMPLETE  Result Date: 05/13/2021    ECHOCARDIOGRAM REPORT   Patient Name:   Roger JIMINEZDate of Exam: 05/13/2021 Medical Rec #:  [id number]     Height:       71.0 in Accession #:    [id number]    Weight:       165.6 lb Date of Birth:  [birth year]     BSA:          1.946 m Patient Age:    89years       BP:           113/79  mmHg Patient Gender: M              HR:           67 bpm.  Exam Location:  [facility] Procedure: 2D Echo, Cardiac Doppler and Color Doppler Indications:    Chest Pain, R07.9  History:        Patient has prior history of Echocardiogram examinations, most                 recent 03/27/2021. CHF; Risk Factors:Diabetes and Hypertension.                 Hx of LV (left ventricular) mural thrombus, AICD.  Sonographer:    Alvino Chapel RCS Referring Phys: [id number] [place]  1. Left ventricular ejection fraction, by estimation, is 20 to 25%. The left ventricle has severely decreased function. The left ventricle demonstrates global hypokinesis. The left ventricular internal cavity size was mildly dilated. Left ventricular diastolic parameters are indeterminate.  2. Right ventricular systolic function is normal. The right ventricular size is normal.  3. Left atrial size was mildly dilated.  4. A small pericardial effusion is present. The pericardial effusion is circumferential.  5. The mitral valve is normal in structure. No evidence of mitral valve regurgitation. No evidence of mitral stenosis.  6. The aortic valve has an indeterminant number of cusps. Aortic valve regurgitation is not visualized. No aortic stenosis is present.  7. The inferior vena cava is normal in size with greater than 50% respiratory variability, suggesting right atrial pressure of 3 mmHg. FINDINGS  Left Ventricle: Globabl hypokinesis, the apex is akinetic. There is no evidence of LV thrombus, echocontrast was used. Left ventricular ejection fraction, by estimation, is 20 to 25%. The left ventricle has severely decreased function. The left ventricle demonstrates global hypokinesis. Definity contrast agent was given IV to delineate the left ventricular endocardial borders. The left ventricular internal cavity size was mildly dilated. There is no left ventricular hypertrophy. Left ventricular diastolic parameters are indeterminate. Right  Ventricle: The right ventricular size is normal. No increase in right ventricular wall thickness. Right ventricular systolic function is normal. Left Atrium: Left atrial size was mildly dilated. Right Atrium: Right atrial size was normal in size. Pericardium: A small pericardial effusion is present. The pericardial effusion is circumferential. Mitral Valve: The mitral valve is normal in structure. No evidence of mitral valve regurgitation. No evidence of mitral valve stenosis. Tricuspid Valve: The tricuspid valve is normal in structure. Tricuspid valve regurgitation is not demonstrated. No evidence of tricuspid stenosis. Aortic Valve: The aortic valve has an indeterminant number of cusps. Aortic valve regurgitation is not visualized. No aortic stenosis is present. Aortic valve mean gradient measures 3.1 mmHg. Aortic valve peak gradient measures 5.4 mmHg. Aortic valve area, by VTI measures 3.79 cm. Pulmonic Valve: The pulmonic valve was not well visualized. Pulmonic valve regurgitation is not visualized. No evidence of pulmonic stenosis. Aorta: The aortic root is normal in size and structure. Pulmonary Artery: Indeterminate PASP, inadequate TR jet. Venous: The inferior vena cava is normal in size with greater than 50% respiratory variability, suggesting right atrial pressure of 3 mmHg. IAS/Shunts: No atrial level shunt detected by color flow Doppler.  LEFT VENTRICLE PLAX 2D LVIDd:         6.00 cm  Diastology LVIDs:         5.00 cm  LV e' medial:    7.51 cm/s LV PW:         0.90 cm  LV E/e' medial:  9.7 LV IVS:        0.90 cm  LV e' lateral:   8.05 cm/s LVOT diam:  2.50 cm  LV E/e' lateral: 9.1 LV SV:         80 LV SV Index:   41 LVOT Area:     4.91 cm  RIGHT VENTRICLE RV S prime:     8.92 cm/s TAPSE (M-mode): 1.2 cm LEFT ATRIUM             Index       RIGHT ATRIUM           Index LA diam:        3.10 cm 1.59 cm/m  RA Area:     15.30 cm LA Vol (A2C):   78.2 ml 40.19 ml/m RA Volume:   41.20 ml  21.17 ml/m LA  Vol (A4C):   46.9 ml 24.10 ml/m LA Biplane Vol: 61.1 ml 31.40 ml/m  AORTIC VALVE AV Area (Vmax):    3.53 cm AV Area (Vmean):   3.08 cm AV Area (VTI):     3.79 cm AV Vmax:           116.30 cm/s AV Vmean:          83.240 cm/s AV VTI:            0.211 m AV Peak Grad:      5.4 mmHg AV Mean Grad:      3.1 mmHg LVOT Vmax:         83.60 cm/s LVOT Vmean:        52.200 cm/s LVOT VTI:          0.163 m LVOT/AV VTI ratio: 0.77  AORTA Ao Root diam: 3.80 cm MITRAL VALVE MV Area (PHT): 4.60 cm    SHUNTS MV Decel Time: 165 msec    Systemic VTI:  0.16 m MV E velocity: 73.00 cm/s  Systemic Diam: 2.50 cm MV A velocity: 77.50 cm/s MV E/A ratio:  0.94 Carlyle Dolly MD Electronically signed by Carlyle Dolly MD Signature Date/Time: 05/13/2021/2:35:26 PM    Final    [place] EKG SITE RITE  Result Date: 05/13/2021 If Site Rite image not attached, placement could not be confirmed due to current cardiac rhythm.   Scheduled Meds: . atorvastatin  40 mg Oral q1800  . Chlorhexidine Gluconate Cloth  6 each Topical Q0600  . colchicine  0.6 mg Oral BID  . heparin  5,000 Units Subcutaneous Q8H  . insulin aspart  0-5 Units Subcutaneous QHS  . insulin aspart  0-9 Units Subcutaneous TID WC  . pantoprazole  40 mg Oral Daily  . PARoxetine  20 mg Oral Daily   Continuous Infusions: . ceFEPime (MAXIPIME) IV Stopped (05/14/21 7903)  . metronidazole Stopped (05/14/21 0801)  . vancomycin Stopped (05/14/21 0114)    LOS: 2 days   Roxan Hockey, MD How to contact the [facility] Attending or Consulting provider [place] or covering provider during after hours [place], for this patient?  1. Check the care team in [facility] and look for a) attending/consulting TR[facility] provider listed and b) the [facility] team listed 2. Log into [email or web address] and use [place]'s universal password to access. If you do not have the password, please contact the hospital operator. 3. Locate the [facility] provider you are looking for under [facility] and page to a number that  you can be directly reached. 4. If you still have difficulty reaching the provider, please page the [facility] (Director on Call) for the Hospitalists listed on amion for assistance.  05/14/2021, 2:05 PM

## 2021-05-14 NOTE — Consult Note (Signed)
Consultation Note Date: 05/14/2021   Patient Name: Roger Mooney  DOB: [birth year]  MRN: [id number]  Age / Sex: 70 y.o., male  PCP: Pcp, No Referring Physician: Roxan Hockey, MD  Reason for Consultation: Establishing goals of care and Psychosocial/spiritual support  HPI/Patient Profile: 70 y.o. male  with past medical history of chronic systolic heart failure with EF of 25%, CKD, LV thrombus, diabetes, HTN, pacer, admitted on 05/12/2021 with hypotension, chronic systolic heart failure, CAD.   Clinical Assessment and Goals of Care: I have reviewed medical records including EPIC notes, labs and imaging, received report from RN,  met at the bedside along with Roger Mooney to discuss diagnosis prognosis, GOC, EOL wishes, disposition and options.  Roger Mooney is lying quietly in bed.  He will make an somewhat keep eye contact.  He is alert and oriented x3, able to make his basic needs known.  There is no family at bedside at this time.  I introduced Palliative Medicine as specialized medical care for people living with serious illness. It focuses on providing relief from the symptoms and stress of a serious illness. The goal is to improve quality of life for both the patient and the family.  We talked about his acute illness.  Per nursing staff, he shares that he does not understand why he is not getting better and why we do not know what is wrong with him.  We talk in detail about his chronic heart failure.  I shared that this is a progressive disease, that we cannot change.  I shared that it would be expected for him to continue to have episodes where he worsens.  The natural disease trajectory and expectations at EOL were discussed.  We talked about his psychosocial situation.  He tells me that he left his wife, and is living with his step sister/step daughter.  At times he is very unclear about his  circumstance/history.  The difference between aggressive medical intervention and comfort care was considered in light of the patient's goals of care.  We talked about short-term rehab.  He tells me that he has been to short-term rehab in the past and would be agreeable to go again if qualified.  Advanced directives, concepts specific to code status, and rehospitalization were considered and discussed.  At this point Roger Mooney states that he would remain full scope/full code.  I encouraged him to consider how long he would accept life support.  Palliative Care services outpatient were explained and offered.   He readily agrees to outpatient palliative services, to continue discussion about the "what if's and maybe's".  Discussed the importance of continued conversation with family and the medical providers regarding overall plan of care and treatment options, ensuring decisions are within the context of the patient's values and GOCs.  Questions and concerns were addressed.   PMT will continue to support holistically.  Conference with attending, cardiology, bedside nursing staff, chaplain service related to patient condition, needs, goals of care.   HCPOA   OTHER -Roger Mooney tells me  that he is still married, but would want his stepdaughter, Roger Mooney, to be his healthcare surrogate    SUMMARY OF RECOMMENDATIONS   At this point full scope/full code Agreeable to outpatient palliative services  Code Status/Advance Care Planning:  Full code  Symptom Management:   Per hospitalist, no additional needs at this time.  Palliative Prophylaxis:   No specific needs at this time  Additional Recommendations (Limitations, Scope, Preferences):  Full Scope Treatment  Psycho-social/Spiritual:   Desire for further Chaplaincy support:yes  Additional Recommendations: Caregiving  Support/Resources and Education on Hospice  Prognosis:   Unable to determine, based on outcomes.  6 months or less  would not be surprising based on chronic illness burden, multiple hospitalizations in the last 6 months.  Discharge Planning: To be determined, based on outcomes.      Primary Diagnoses: Present on Admission: . Hypotension . HTN (hypertension) . Chronic systolic CHF (congestive heart failure) ([place]) . LV (left ventricular) mural thrombus . AKI (acute kidney injury) ([place])   I have reviewed the medical record, interviewed the patient and family, and examined the patient. The following aspects are pertinent.  Past Medical History:  Diagnosis Date  . Diabetes mellitus without complication ([place])   . Hypertension    Social History   Socioeconomic History  . Marital status: Legally Separated    Spouse name: Not on file  . Number of children: Not on file  . Years of education: Not on file  . Highest education level: Not on file  Occupational History  . Not on file  Tobacco Use  . Smoking status: Current Every Day Smoker    Types: Cigarettes  . Smokeless tobacco: Never Used  Substance and Sexual Activity  . Alcohol use: Not Currently  . Drug use: Yes    Types: Marijuana  . Sexual activity: Not on file  Other Topics Concern  . Not on file  Social History Narrative  . Not on file   Social Determinants of Health   Financial Resource Strain: Not on file  Food Insecurity: Not on file  Transportation Needs: Not on file  Physical Activity: Not on file  Stress: Not on file  Social Connections: Not on file   History reviewed. No pertinent family history. Scheduled Meds: . atorvastatin  40 mg Oral q1800  . Chlorhexidine Gluconate Cloth  6 each Topical Q0600  . colchicine  0.6 mg Oral BID  . heparin  5,000 Units Subcutaneous Q8H  . insulin aspart  0-5 Units Subcutaneous QHS  . insulin aspart  0-9 Units Subcutaneous TID WC  . pantoprazole  40 mg Oral Daily  . PARoxetine  20 mg Oral Daily   Continuous Infusions: . ceFEPime (MAXIPIME) IV 200 mL/hr at 05/14/21 0610  .  metronidazole 500 mg (05/14/21 0701)  . vancomycin 750 mg (05/14/21 0014)   PRN Meds:.acetaminophen **OR** acetaminophen, albuterol, ondansetron **OR** ondansetron (ZOFRAN) IV, oxyCODONE, polyethylene glycol Medications Prior to Admission:  Prior to Admission medications   Medication Sig Start Date End Date Taking? Authorizing Provider  aspirin 81 MG chewable tablet Chew 81 mg by mouth daily. Took 3-4 weeks ago 02/24/21  Yes [provider]  atorvastatin (LIPITOR) 40 MG tablet Take 1 tablet by mouth daily. 03/19/21  Yes [provider]  isosorbide mononitrate (IMDUR) 30 MG 24 hr tablet Take 30 mg by mouth daily. 05/04/21  Yes [provider]  lisinopril (ZESTRIL) 5 MG tablet Take 5 mg by mouth daily. 03/19/21  Yes [provider]  metFORMIN (  GLUCOPHAGE) 500 MG tablet Take 500 mg by mouth 2 (two) times daily. 03/19/21  Yes [provider]  metoprolol succinate (TOPROL-XL) 25 MG 24 hr tablet Take 0.5 tablets by mouth daily. 05/04/21  Yes [provider]  pantoprazole (PROTONIX) 40 MG tablet Take 40 mg by mouth daily. 03/19/21  Yes [provider]  PARoxetine (PAXIL) 20 MG tablet Take 20 mg by mouth daily. 05/04/21  Yes [provider]  warfarin (COUMADIN) 2 MG tablet Take 2 mg by mouth daily. 04/03/21  Yes [provider]   No Known Allergies Review of Systems  Unable to perform ROS: Other  All other systems reviewed and are negative.   Physical Exam Vitals and nursing note reviewed.  Constitutional:      General: He is not in acute distress.    Appearance: He is not ill-appearing.  HENT:     Head: Normocephalic and atraumatic.  Cardiovascular:     Rate and Rhythm: Normal rate.  Pulmonary:     Effort: Pulmonary effort is normal.  Skin:    General: Skin is warm and dry.  Neurological:     Mental Status: He is alert and oriented to person, place, and time.  Psychiatric:        Mood and Affect: Mood normal.         Behavior: Behavior normal.     Vital Signs: BP 103/65   Pulse 65   Temp 98.4 F (36.9 C) (Oral)   Resp 20   Ht _0  (1.803 m)   Wt 78.7 kg   SpO2 97%   BMI 24.20 kg/m  Pain Scale: 0-10 POSS *See Group Information*: S-Acceptable,Sleep, easy to arouse Pain Score: Asleep   SpO2: SpO2: 97 % O2 Device:SpO2: 97 % O2 Flow Rate: .O2 Flow Rate (L/min): 4 L/min  IO: Intake/output summary:   Intake/Output Summary (Last 24 hours) at 05/14/2021 0817 Last data filed at 05/14/2021 0610 Gross per 24 hour  Intake 1314.51 ml  Output 1250 ml  Net 64.51 ml    LBM: Last BM Date: 05/12/21 Baseline Weight: Weight: 81.6 kg Most recent weight: Weight: 78.7 kg     Palliative Assessment/Data:   Flowsheet Rows   Flowsheet Row Most Recent Value  Intake Tab   Referral Department Hospitalist  Unit at Time of Referral Intermediate Care Unit  Palliative Care Primary Diagnosis Cardiac  Date Notified 05/13/21  Palliative Care Type New Palliative care  Reason for referral Clarify Goals of Care  Date of Admission 05/12/21  Date first seen by Palliative Care 05/14/21  # of days Palliative referral response time 1 Day(s)  # of days IP prior to Palliative referral 1  Clinical Assessment   Palliative Performance Scale Score 40%  Pain Max last 24 hours Not able to report  Pain Min Last 24 hours Not able to report  Dyspnea Max Last 24 Hours Not able to report  Dyspnea Min Last 24 hours Not able to report  Psychosocial & Spiritual Assessment   Palliative Care Outcomes       Time In: 0900 Time Out: 1010 Time Total: 70 minutes  Greater than 50%  of this time was spent counseling and coordinating care related to the above assessment and plan.  Signed by: Drue Novel, NP   Please contact Palliative Medicine Team phone at [phone] for questions and concerns.  For individual provider: See Shea Evans

## 2021-05-14 NOTE — Progress Notes (Signed)
COOX reasonable at 63%, CVP slightly elevated. May consider gentle diuresis in AM pending blood pressures.  Dina Rich MD

## 2021-05-14 NOTE — Progress Notes (Signed)
Peripherally Inserted Central Catheter Placement  The IV Nurse has discussed with the patient and/or persons authorized to consent for the patient, the purpose of this procedure and the potential benefits and risks involved with this procedure.  The benefits include less needle sticks, lab draws from the catheter, and the patient may be discharged home with the catheter. Risks include, but not limited to, infection, bleeding, blood clot (thrombus formation), and puncture of an artery; nerve damage and irregular heartbeat and possibility to perform a PICC exchange if needed/ordered by physician.  Alternatives to this procedure were also discussed.  Bard Power PICC patient education guide, fact sheet on infection prevention and patient information card has been provided to patient /or left at bedside.    PICC Placement Documentation  PICC Double Lumen 05/14/21 PICC Right Brachial 40 cm 1 cm (Active)  Indication for Insertion or Continuance of Line Prolonged intravenous therapies 05/14/21 1548  Exposed Catheter (cm) 1 cm 05/14/21 1548  Site Assessment Clean;Dry;Intact 05/14/21 1548  Lumen #1 Status Flushed;Saline locked;Blood return noted 05/14/21 1548  Lumen #2 Status Flushed;Saline locked;Blood return noted 05/14/21 1548  Dressing Type Transparent;Securing device 05/14/21 1548  Dressing Status Clean;Dry;Intact 05/14/21 1548  Antimicrobial disc in place? Yes 05/14/21 1548  Line Care Connections checked and tightened 05/14/21 1548  Dressing Intervention New dressing 05/14/21 1548  Dressing Change Due 05/21/21 05/14/21 1548       Bing Plume 05/14/2021, 4:01 PM

## 2021-05-14 NOTE — Progress Notes (Signed)
Progress Note  Patient Name: Roger Mooney Date of Encounter: 05/14/2021  [facility] Cardiologist: New  Subjective   Some ongoing SOB  Inpatient Medications    Scheduled Meds: . atorvastatin  40 mg Oral q1800  . Chlorhexidine Gluconate Cloth  6 each Topical Q0600  . colchicine  0.6 mg Oral BID  . heparin  5,000 Units Subcutaneous Q8H  . insulin aspart  0-5 Units Subcutaneous QHS  . insulin aspart  0-9 Units Subcutaneous TID WC  . pantoprazole  40 mg Oral Daily  . PARoxetine  20 mg Oral Daily   Continuous Infusions: . ceFEPime (MAXIPIME) IV Stopped (05/14/21 4827)  . metronidazole Stopped (05/14/21 0801)  . vancomycin Stopped (05/14/21 0114)   PRN Meds: acetaminophen **OR** acetaminophen, albuterol, ondansetron **OR** ondansetron (ZOFRAN) IV, oxyCODONE, polyethylene glycol   Vital Signs    Vitals:   05/14/21 0600 05/14/21 0700 05/14/21 0800 05/14/21 0822  BP: (!) 95/52 103/65 (!) 86/45   Pulse: 60 65 62   Resp: 18 20 (!) 22   Temp: 98.4 F (36.9 C)   98.4 F (36.9 C)  TempSrc: Oral   Oral  SpO2: 96% 97% 96%   Weight:      Height:        Intake/Output Summary (Last 24 hours) at 05/14/2021 1012 Last data filed at 05/14/2021 0854 Gross per 24 hour  Intake 1762.72 ml  Output 1250 ml  Net 512.72 ml   Last 3 Weights 05/14/2021 05/13/2021 05/12/2021  Weight (lbs) 173 lb 8 oz 165 lb 9.1 oz 180 lb  Weight (kg) 78.7 kg 75.1 kg 81.647 kg      Telemetry    NSR - Personally Reviewed  ECG    n/a - Personally Reviewed  Physical Exam   GEN: No acute distress.   Neck: No JVD Cardiac: RRR, no murmurs, rubs, or gallops.  Respiratory: Clear to auscultation bilaterally. GI: Soft, nontender, non-distended  MS: No edema; No deformity. Neuro:  Nonfocal  Psych: Normal affect   Labs    High Sensitivity Troponin:   Recent Labs  Lab 05/12/21 1201 05/12/21 1344  TROPONINIHS 9 9      Chemistry Recent Labs  Lab 05/12/21 1201 05/13/21 0447 05/14/21 0432  NA  132* 135  --   K 4.4 4.2  --   CL 103 107  --   CO2 23 19*  --   GLUCOSE 144* 109*  --   BUN 23 20  --   CREATININE 1.62* 1.25* 1.13  CALCIUM 8.4* 8.4*  --   PROT 7.5  --   --   ALBUMIN 3.5  --   --   AST 16  --   --   ALT 13  --   --   ALKPHOS 55  --   --   BILITOT 3.1*  --   --   GFRNONAA 46* >60 >60  ANIONGAP 6 9  --      Hematology Recent Labs  Lab 05/12/21 1201 05/13/21 0447 05/14/21 0432  WBC 21.3* 18.2* 15.2*  RBC 5.20 4.82 4.37  HGB 13.0 12.2* 11.2*  HCT 42.6 40.2 36.8*  MCV 81.9 83.4 84.2  MCH 25.0* 25.3* 25.6*  MCHC 30.5 30.3 30.4  RDW 19.1* 18.8* 18.6*  PLT 267 245 227    BNP Recent Labs  Lab 05/12/21 1201 05/14/21 0432  BNP 44.0 106.0*     DDimer  Recent Labs  Lab 05/12/21 1201  DDIMER 0.99*     Radiology  CT Angio Chest PE W and/or Wo Contrast  Result Date: 05/12/2021 CLINICAL DATA:  70 year old male with history of central chest pain and shortness of breath. EXAM: CT ANGIOGRAPHY CHEST WITH CONTRAST TECHNIQUE: Multidetector CT imaging of the chest was performed using the standard protocol during bolus administration of intravenous contrast. Multiplanar CT image reconstructions and MIPs were obtained to evaluate the vascular anatomy. CONTRAST:  1109m OMNIPAQUE IOHEXOL 350 MG/ML SOLN COMPARISON:  No priors. FINDINGS: Cardiovascular: No filling defects within the pulmonary arterial to suggest pulmonary emboli. Heart size is normal. Extensive pericardial thickening and small volume of pericardial fluid. No pericardial calcification. There is aortic atherosclerosis, as well as atherosclerosis of the great vessels of the mediastinum and the coronary arteries, including calcified atherosclerotic plaque in the left main, left anterior descending, left circumflex and right coronary arteries. Proximal left anterior descending coronary artery stent. Left-sided pacemaker device in place with lead tips terminating in the right atrium and right ventricular apex.  Mediastinum/Nodes: No pathologically enlarged mediastinal or hilar lymph nodes. Esophagus is unremarkable in appearance. No axillary lymphadenopathy. Lungs/Pleura: Study is limited by extensive patient respiratory motion. With these limitations in mind, there are no definite suspicious appearing pulmonary nodules or masses are noted. Patchy areas of ground-glass attenuation and septal thickening with thickening of the peribronchovascular interstitium and regional architectural distortion, most evident throughout the mid to upper lungs, concerning for potential interstitial lung disease such as hypersensitivity pneumonitis. No acute consolidative airspace disease. Trace right pleural effusion. No left pleural effusion. Upper Abdomen: Aortic atherosclerosis. Musculoskeletal: There are no aggressive appearing lytic or blastic lesions noted in the visualized portions of the skeleton. Review of the MIP images confirms the above findings. IMPRESSION: 1. No evidence of pulmonary embolism. 2. Extensive pericardial thickening and small volume of pericardial fluid. Clinical correlation for signs and symptoms of acute pericarditis is recommended. No pericardial calcification noted at this time. 3. Trace right pleural effusion. 4. The appearance of the lungs is suggestive of potential interstitial lung disease such as chronic hypersensitivity pneumonitis. Follow-up nonemergent high-resolution chest CT is recommended in 6 months to assess for temporal changes in the appearance of the lung parenchyma. Additionally, nonemergent outpatient referral to Pulmonology is suggested in the near future for further clinical evaluation. 5. Aortic atherosclerosis, in addition to left main and 3 vessel coronary artery disease. Please note that although the presence of coronary artery calcium documents the presence of coronary artery disease, the severity of this disease and any potential stenosis cannot be assessed on this non-gated CT  examination. Assessment for potential risk factor modification, dietary therapy or pharmacologic therapy may be warranted, if clinically indicated. Aortic Atherosclerosis (ICD10-I70.0). Electronically Signed   By: DVinnie LangtonM.D.   On: 05/12/2021 15:39   DG Chest Portable 1 View  Result Date: 05/12/2021 CLINICAL DATA:  70year old male with history of chest pain. EXAM: PORTABLE CHEST 1 VIEW COMPARISON:  No priors. FINDINGS: Widespread areas of interstitial prominence and diffuse peribronchial cuffing. Lung volumes are normal. No consolidative airspace disease. No pleural effusions. No pneumothorax. No pulmonary nodule or mass noted. Pulmonary vasculature and the cardiomediastinal silhouette are within normal limits. Left-sided pacemaker/AICD in place with lead tips projecting over the expected location of the right atrium and right ventricle. IMPRESSION: 1. The appearance of the chest suggests bronchitis, as above. Electronically Signed   By: DVinnie LangtonM.D.   On: 05/12/2021 12:38   ECHOCARDIOGRAM COMPLETE  Result Date: 05/13/2021    ECHOCARDIOGRAM REPORT   Patient Name:  Roger Mooney Date of Exam: 05/13/2021 Medical Rec #:  [id number]      Height:       71.0 in Accession #:    [id number]     Weight:       165.6 lb Date of Birth:  09-17-1951      BSA:          1.946 m Patient Age:    70 years       BP:           113/79 mmHg Patient Gender: M              HR:           67 bpm. Exam Location:  [facility] Procedure: 2D Echo, Cardiac Doppler and Color Doppler Indications:    Chest Pain, R07.9  History:        Patient has prior history of Echocardiogram examinations, most                 recent 03/27/2021. CHF; Risk Factors:Diabetes and Hypertension.                 Hx of LV (left ventricular) mural thrombus, AICD.  Sonographer:    Alvino Chapel RCS Referring Phys: [id number] [place]  1. Left ventricular ejection fraction, by estimation, is 20 to 25%. The left ventricle has severely  decreased function. The left ventricle demonstrates global hypokinesis. The left ventricular internal cavity size was mildly dilated. Left ventricular diastolic parameters are indeterminate.  2. Right ventricular systolic function is normal. The right ventricular size is normal.  3. Left atrial size was mildly dilated.  4. A small pericardial effusion is present. The pericardial effusion is circumferential.  5. The mitral valve is normal in structure. No evidence of mitral valve regurgitation. No evidence of mitral stenosis.  6. The aortic valve has an indeterminant number of cusps. Aortic valve regurgitation is not visualized. No aortic stenosis is present.  7. The inferior vena cava is normal in size with greater than 50% respiratory variability, suggesting right atrial pressure of 3 mmHg. FINDINGS  Left Ventricle: Globabl hypokinesis, the apex is akinetic. There is no evidence of LV thrombus, echocontrast was used. Left ventricular ejection fraction, by estimation, is 20 to 25%. The left ventricle has severely decreased function. The left ventricle demonstrates global hypokinesis. Definity contrast agent was given IV to delineate the left ventricular endocardial borders. The left ventricular internal cavity size was mildly dilated. There is no left ventricular hypertrophy. Left ventricular diastolic parameters are indeterminate. Right Ventricle: The right ventricular size is normal. No increase in right ventricular wall thickness. Right ventricular systolic function is normal. Left Atrium: Left atrial size was mildly dilated. Right Atrium: Right atrial size was normal in size. Pericardium: A small pericardial effusion is present. The pericardial effusion is circumferential. Mitral Valve: The mitral valve is normal in structure. No evidence of mitral valve regurgitation. No evidence of mitral valve stenosis. Tricuspid Valve: The tricuspid valve is normal in structure. Tricuspid valve regurgitation is not  demonstrated. No evidence of tricuspid stenosis. Aortic Valve: The aortic valve has an indeterminant number of cusps. Aortic valve regurgitation is not visualized. No aortic stenosis is present. Aortic valve mean gradient measures 3.1 mmHg. Aortic valve peak gradient measures 5.4 mmHg. Aortic valve area, by VTI measures 3.79 cm. Pulmonic Valve: The pulmonic valve was not well visualized. Pulmonic valve regurgitation is not visualized. No evidence of pulmonic stenosis. Aorta: The aortic root is normal  in size and structure. Pulmonary Artery: Indeterminate PASP, inadequate TR jet. Venous: The inferior vena cava is normal in size with greater than 50% respiratory variability, suggesting right atrial pressure of 3 mmHg. IAS/Shunts: No atrial level shunt detected by color flow Doppler.  LEFT VENTRICLE PLAX 2D LVIDd:         6.00 cm  Diastology LVIDs:         5.00 cm  LV e' medial:    7.51 cm/s LV PW:         0.90 cm  LV E/e' medial:  9.7 LV IVS:        0.90 cm  LV e' lateral:   8.05 cm/s LVOT diam:     2.50 cm  LV E/e' lateral: 9.1 LV SV:         80 LV SV Index:   41 LVOT Area:     4.91 cm  RIGHT VENTRICLE RV S prime:     8.92 cm/s TAPSE (M-mode): 1.2 cm LEFT ATRIUM             Index       RIGHT ATRIUM           Index LA diam:        3.10 cm 1.59 cm/m  RA Area:     15.30 cm LA Vol (A2C):   78.2 ml 40.19 ml/m RA Volume:   41.20 ml  21.17 ml/m LA Vol (A4C):   46.9 ml 24.10 ml/m LA Biplane Vol: 61.1 ml 31.40 ml/m  AORTIC VALVE AV Area (Vmax):    3.53 cm AV Area (Vmean):   3.08 cm AV Area (VTI):     3.79 cm AV Vmax:           116.30 cm/s AV Vmean:          83.240 cm/s AV VTI:            0.211 m AV Peak Grad:      5.4 mmHg AV Mean Grad:      3.1 mmHg LVOT Vmax:         83.60 cm/s LVOT Vmean:        52.200 cm/s LVOT VTI:          0.163 m LVOT/AV VTI ratio: 0.77  AORTA Ao Root diam: 3.80 cm MITRAL VALVE MV Area (PHT): 4.60 cm    SHUNTS MV Decel Time: 165 msec    Systemic VTI:  0.16 m MV E velocity: 73.00 cm/s   Systemic Diam: 2.50 cm MV A velocity: 77.50 cm/s MV E/A ratio:  0.94 Carlyle Dolly MD Electronically signed by Carlyle Dolly MD Signature Date/Time: 05/13/2021/2:35:26 PM    Final    [place] EKG SITE RITE  Result Date: 05/13/2021 If Site Rite image not attached, placement could not be confirmed due to current cardiac rhythm.   Cardiac Studies     Patient Profile     70 yo male history of HTN, DM2, pacemaker, polysubstance abuse including cocaine, presents with chest pain. History of medical noncompliance, medical care across multiple health systems including [facility], [facility], [place]. Consulted for chest pain.  Assessment & Plan    1. Hypotension - given IVFs in ER - emperic treatment for possible sepsis, signifiacnt leukocytosis on admission -SBPs 90s to 100s. Awaiting PICC line, had to have 48 hrs of neg cultures, should be placed today.  - AM cortisol 14.8. Check procalcitonin. COOX and CVP once PICC line placed.   2. Chronic systolic HF -  echo 25% by recent echos at [place]. Has AICD - long history of HF, seen previously at multiple medical centers so exact history somewhat unclear - BNP 44-->106, though device check suggested from fluid overload. CXR and CT without significant edema.   - unclear if low EF playing a role in hypotension. Would obtain picc line for COOX and CVP monitoring. Does not appears significantly fluid overlaoded by exam,  - hypotension prohibits CHF meds at this time.   3. History of CAD prior LAD stent - presented with atypical chest pain - trops negative - from [facility] note a cath in 11/2020 was referenced (unknown location) with patent vessels - with fairly recent cath, extensitve history of noncompliance would not consider ischemic evaluation in absence of clear objective evidence of ischemia  4. Chest pain - trops negative - CT with suggestion of pericardial thickening, elevated ESR and CRP. EKG not consistent wit pericardititis, no  rub on exam.  - echo LVEF 25%, small pericardial effusion no tamponade - reasonable to try a course of colchicine (of note already with some loose stools prior to starting). Would avoid NSAIDs given his LV dysfunction and CAD. Depending on clinical course could consider a course of steroids.   5. ICD - normal device check in ER by [employer] rep, though suggested evidence of fluid accumulation.   6. LV thrombus, was discharged from [facility] 03/27/21 on warfarin. Subtherapeutic INR on admission, long history of medication noncompliance - repeat echo shows no evidence of thrombus, given resolution and poor coumadin compliance will d/c anticoag  7. Abnormal Lung CT - concern for possible ILD, needs high res CT at f/u  8. Polysubstance abuse  9. Leukocytosis - on vanc/cefepime per primary team - WBC trendign down, check procalcitonin  Multiple advanced comorbidities with extensive history of poor compliance, palliative has been consulted.    For questions or updates, please contact [facility] Please consult [email or web address] for contact info under        Signed, Carlyle Dolly, MD  05/14/2021, 10:12 AM

## 2021-05-15 LAB — PROCALCITONIN: Procalcitonin: <0.1 ng/mL

## 2021-05-15 LAB — GLUCOSE, CAPILLARY
Glucose-Capillary: 94 mg/dL (ref 70–99)
Glucose-Capillary: 91 mg/dL (ref 70–99)
Glucose-Capillary: 109 mg/dL — ABNORMAL HIGH (ref 70–99)
Glucose-Capillary: 159 mg/dL — ABNORMAL HIGH (ref 70–99)
Glucose-Capillary: 135 mg/dL — ABNORMAL HIGH (ref 70–99)

## 2021-05-15 LAB — COOXEMETRY PANEL
Carboxyhemoglobin: 1.2 % (ref 0.5–1.5)
Methemoglobin: 1 % (ref 0.0–1.5)
O2 Saturation: 66.7 %
Total hemoglobin: 11.3 g/dL — ABNORMAL LOW (ref 12.0–16.0)

## 2021-05-15 MED ORDER — PREDNISONE 20 MG PO TABS
40.0000 mg | ORAL_TABLET | Freq: Every day | ORAL | Status: DC
  Administered 2021-05-15 – 2021-05-16 (×2): 40 mg via ORAL
  Filled 2021-05-15 (×2): qty 2

## 2021-05-15 MED ORDER — ATORVASTATIN CALCIUM 40 MG PO TABS
40.0000 mg | ORAL_TABLET | Freq: Every day | ORAL | Status: DC
  Administered 2021-05-15: 40 mg via ORAL
  Filled 2021-05-15: qty 1

## 2021-05-15 MED ORDER — FUROSEMIDE 10 MG/ML IJ SOLN
40.0000 mg | Freq: Once | INTRAMUSCULAR | Status: AC
  Administered 2021-05-15: 40 mg via INTRAVENOUS
  Filled 2021-05-15: qty 4

## 2021-05-15 NOTE — Progress Notes (Signed)
PROGRESS NOTE   Roger Mooney  PHK:[id number] DOB: 1951/08/20 DOA: 05/12/2021 PCP: Pcp, No   Chief Complaint  Patient presents with  . Chest Pain   Level of care: Stepdown  Brief Admission History:  70 y.o. male with medical history significant for LV thrombus, chronic systolic heart failure EF of 25%, AICD, CKD, diabetes mellitus.  Patient presented to the ED with complaints of chest pain.  Patient reports onset of chest pain over the past 2 days, intermittent.  Reports improvement in chest pain for with pain medications, he is unaware of any aggravating factors.  Patient used to live in [place], moved to the area and lives with his granddaughter Roger Mooney.  Patient was recently hospitalized at [facility] for 4/14-4/41 and prior to that at [facility] [place] 4/9 through 4/14-where he was managed for respiratory failure secondary to aspiration pneumonia with septic shock and requiring mechanical ventilation and Levophed.  UDS was positive for benzos, cocaine, opiates and THC.  Hospitalization was complicated by development of vascular congestion/fluid overload requiring Lasix, ventricular tachycardia requiring amiodarone, on CKD with creatinine up to 1.5, discharge creatinine of 1.19.  An LV thrombus was found on echocardiogram 03/27/2021, requiring warfarin.  Patient required CIWA for alcohol withdrawal.  Patient was subsequently transferred to tertiary center-[facility] for further care.  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  2)Severe sepsis with septic shock --- blood and urine cultures from 05/12/21 NGTD -Stool culture/GI pathogen from 05/13/2021 with both enteroaggregate of E. coli AND enteropathogenic E. Coli -Azithromycin 500 mg daily for 3 days for E. coli in stool as above -Sepsis pathophysiology noted(patient initially had tachypnea, leukocytosis, tachycardia hypotension and hypoxia),  currently on Vanco, cefepime and Flagyl---we will stop IV Vanco, Flagyl and cefepime after 05/14/2021 if blood cultures still negative CRP 14.3 >> 18.9 ESR 31>>42 WBC 21.3 >>18.2>>15.2 -BP improved after aggressive IV fluids initially no further IV fluids given very low EF -Given low EF his BP may run low Has PICC line  in order to allow for CVP and COOX    3)Social/Ethics --patient with ongoing recreational drug use including cocaine, significant comorbidities and high risk for decompensation  -Palliative consult appreciated patient is a full code -Chaplain had repeated conversations with patient patient more cooperative after talking with chaplain  4)Hypotension--- suspect this is mostly due to #2 above, #1 above is contributory -AM cortisol 14.8 --Given low EF his BP may run low -Needs PICC line or IJ/[place] central line in order to allow for CVP and COOX   5)AKI----acute kidney injury -resolved with hydration and improvement in BP -   creatinine on admission= 1.62 , baseline creatinine = wnl    ,  -creatinine is now=1.1  ,  --renally adjust medications, avoid nephrotoxic  agents / dehydration  /  hypotension  6)DM2--continue to hold metformin Use Novolog/Humalog Sliding scale insulin with Accu-Cheks/Fingersticks as ordered   7)LV thrombus--discharged from [facility] March 27, 2021 on Coumadin, patient not compliant, INR was subtherapeutic repeat echo without LV thrombus, cardiology recommends no further anticoagulation especially given noncompliance  8)Polysubstance abuse - USD positive for benzo and THC.  -Patient admits to ongoing cocaine use  9)PULM--- possible ILD, please see CT chest findings -Patient will need to follow-up pulmonologist as outpatient, patient may need high-resolution CT chest  10)H/o CAD--status post prior LAD stent--- last LHC 12/02/2020, with patent vessels apparently -Given ongoing cocaine abuse and history of noncompliance cardiology team does not believe patient is a good candidate for further invasive testing at this time -Patient has ruled out for ACS by cardiac enzymes and EKG at this time    DVT prophylaxis: warfarin  Code Status: full  Family Communication: plan of care discussed with patient at bedside Disposition: home  Status is: Inpatient  Remains inpatient appropriate because:Concerns for sepsis with septic shock, currently on IV antibiotics, also hemodynamic instability with low EF requires further cardiovascular monitoring and interventions   Dispo: The patient is from: Home              Anticipated d/c is to: Home              Patient currently is not medically stable to d/c.   Difficult to place patient No   Consultants:   Cardiology  Palliative care   Procedures:   PICC placement 05/14/21  Antimicrobials:  Cefepime 5/30> vanc  5/30  Subjective: -Chaplain had repeated conversations with patient patient more cooperative after talking with chaplain -Complains of shortness of breath and intermittent chest pain   Objective: Vitals:   05/15/21 0900 05/15/21 1000 05/15/21 1120 05/15/21 1618  BP: 114/68 114/74    Pulse: 64 65     Resp:  20    Temp:   98.5 F (36.9 C) 98.4 F (36.9 C)  TempSrc:   Oral Oral  SpO2: 98% 94%    Weight:      Height:        Intake/Output Summary (Last 24 hours) at 05/15/2021 1747 Last data filed at 05/15/2021 1315 Gross per 24 hour  Intake 156.17 ml  Output 1925 ml  Net -1768.83 ml   Filed Weights   05/13/21 0928 05/14/21 0500 05/15/21 0347  Weight: 75.1 kg 78.7 kg 79.2 kg    Examination:   Physical Exam  Gen:- Awake Alert,  In no apparent distress, chronically ill-appearing, speaking in complete sentences HEENT:- [place].AT, No sclera icterus Neck-Supple Neck,No JVD,.  Lungs-mostly clear, air movement is fair CV- S1, S2 normal, RR  Abd-  +ve B.Sounds, Abd Soft, No tenderness,    Extremity/Skin:- No  edema,   good pulses Psych-affect is appropriate, oriented x3 Neuro-no new focal deficits, no tremors MSK-right arm PICC line   Data Reviewed: I have personally reviewed following labs and imaging studies  CBC: Recent Labs  Lab 05/12/21 1201 05/13/21 0447 05/14/21 0432  WBC 21.3* 18.2* 15.2*  NEUTROABS 18.3*  --  11.3*  HGB 13.0 12.2* 11.2*  HCT 42.6 40.2 36.8*  MCV 81.9 83.4 84.2  PLT 267 245 622    Basic Metabolic Panel: Recent Labs  Lab 05/12/21 1201 05/13/21 0447 05/14/21 0432  NA 132* 135  --   K 4.4 4.2  --   CL 103 107  --   CO2 23 19*  --  GLUCOSE 144* 109*  --   BUN 23 20  --   CREATININE 1.62* 1.25* 1.13  CALCIUM 8.4* 8.4*  --     GFR: Estimated Creatinine Clearance: 65.7 mL/min (by C-G formula based on SCr of 1.13 mg/dL).  Liver Function Tests: Recent Labs  Lab 05/12/21 1201  AST 16  ALT 13  ALKPHOS 55  BILITOT 3.1*  PROT 7.5  ALBUMIN 3.5    CBG: Recent Labs  Lab 05/14/21 2131 05/15/21 0344 05/15/21 0738 05/15/21 1119 05/15/21 1615  GLUCAP 72 94 91 109* 159*    Recent Results (from the past 240 hour(s))  Culture, blood (routine x 2)     Status: None (Preliminary result)   Collection Time: 05/12/21 12:02 PM    Specimen: Right Antecubital; Blood  Result Value Ref Range Status   Specimen Description RIGHT ANTECUBITAL  Final   Special Requests   Final    BOTTLES DRAWN AEROBIC AND ANAEROBIC Blood Culture adequate volume   Culture   Final    NO GROWTH 3 DAYS Performed at [facility], [address] [place], [place] [address]    Report Status PENDING  Incomplete  Resp Panel by RT-PCR (Flu A&B, Covid) Nasopharyngeal Swab     Status: None   Collection Time: 05/12/21 12:03 PM   Specimen: Nasopharyngeal Swab; Nasopharyngeal(NP) swabs in vial transport medium  Result Value Ref Range Status   SARS Coronavirus 2 by RT PCR NEGATIVE NEGATIVE Final    Comment: (NOTE) SARS-CoV-2 target nucleic acids are NOT DETECTED.  The SARS-CoV-2 RNA is generally detectable in upper respiratory specimens during the acute phase of infection. The lowest concentration of SARS-CoV-2 viral copies this assay can detect is 138 copies/mL. A negative result does not preclude SARS-Cov-2 infection and should not be used as the sole basis for treatment or other patient management decisions. A negative result may occur with  improper specimen collection/handling, submission of specimen other than nasopharyngeal swab, presence of viral mutation(s) within the areas targeted by this assay, and inadequate number of viral copies(<138 copies/mL). A negative result must be combined with clinical observations, patient history, and epidemiological information. The expected result is Negative.  Fact Sheet for Patients:  EntrepreneurPulse.com.au  Fact Sheet for Healthcare Providers:  IncredibleEmployment.be  This test is no t yet approved or cleared by the [place] FDA and  has been authorized for detection and/or diagnosis of SARS-CoV-2 by FDA under an Emergency Use Authorization (EUA). This EUA will remain  in effect (meaning this test can be used) for the duration of the COVID-19 declaration  under Section 564(b)(1) of the Act, 21 U.S.C.section 360bbb-3(b)(1), unless the authorization is terminated  or revoked sooner.       Influenza A by PCR NEGATIVE NEGATIVE Final   Influenza B by PCR NEGATIVE NEGATIVE Final    Comment: (NOTE) The Xpert Xpress SARS-CoV-2/FLU/RSV plus assay is intended as an aid in the diagnosis of influenza from Nasopharyngeal swab specimens and should not be used as a sole basis for treatment. Nasal washings and aspirates are unacceptable for Xpert Xpress SARS-CoV-2/FLU/RSV testing.  Fact Sheet for Patients: EntrepreneurPulse.com.au  Fact Sheet for Healthcare Providers: IncredibleEmployment.be  This test is not yet approved or cleared by the [place] FDA and has been authorized for detection and/or diagnosis of SARS-CoV-2 by FDA under an Emergency Use Authorization (EUA). This EUA will remain in effect (meaning this test can be used) for the duration of the COVID-19 declaration under Section 564(b)(1) of the Act, 21 U.S.C.  section 360bbb-3(b)(1), unless the authorization is terminated or revoked.  Performed at [facility], [address], [place], [place] [address]   Culture, blood (routine x 2)     Status: None (Preliminary result)   Collection Time: 05/12/21  1:13 PM   Specimen: Right Antecubital; Blood  Result Value Ref Range Status   Specimen Description   Final    RIGHT ANTECUBITAL BOTTLES DRAWN AEROBIC AND ANAEROBIC   Special Requests Blood Culture adequate volume  Final   Culture   Final    NO GROWTH 3 DAYS Performed at [facility], [address], [place], [place] [address]    Report Status PENDING  Incomplete  Culture, Urine     Status: None   Collection Time: 05/12/21  9:55 PM   Specimen: Urine, Clean Catch  Result Value Ref Range Status   Specimen Description   Final    URINE, CLEAN CATCH Performed at [facility], [address], [place], [place] [address]    Special Requests   Final     NONE Performed at [facility], [address] [place], [place] [address]    Culture   Final    NO GROWTH Performed at [facility], [place] [address], [place], [place] [address]    Report Status 05/14/2021 FINAL  Final  Gastrointestinal Panel by PCR , Stool     Status: Abnormal   Collection Time: 05/13/21  2:17 AM   Specimen: Stool  Result Value Ref Range Status   Campylobacter species NOT DETECTED NOT DETECTED Final   Plesimonas shigelloides NOT DETECTED NOT DETECTED Final   Salmonella species NOT DETECTED NOT DETECTED Final   Yersinia enterocolitica NOT DETECTED NOT DETECTED Final   Vibrio species NOT DETECTED NOT DETECTED Final   Vibrio cholerae NOT DETECTED NOT DETECTED Final   Enteroaggregative E coli (EAEC) DETECTED (A) NOT DETECTED Final    Comment: RESULT CALLED TO, READ BACK BY AND VERIFIED WITH: JESSICA HEARN _0  ON 05/14/21 SKL    Enteropathogenic E coli (EPEC) DETECTED (A) NOT DETECTED Final    Comment: RESULT CALLED TO, READ BACK BY AND VERIFIED WITH: JESSICA HEARN _1  ON 05/14/21 SKL    Enterotoxigenic E coli (ETEC) NOT DETECTED NOT DETECTED Final   Shiga like toxin producing E coli (STEC) NOT DETECTED NOT DETECTED Final   Shigella/Enteroinvasive E coli (EIEC) NOT DETECTED NOT DETECTED Final   Cryptosporidium NOT DETECTED NOT DETECTED Final   Cyclospora cayetanensis NOT DETECTED NOT DETECTED Final   Entamoeba histolytica NOT DETECTED NOT DETECTED Final   Giardia lamblia NOT DETECTED NOT DETECTED Final   Adenovirus F40/41 NOT DETECTED NOT DETECTED Final   Astrovirus NOT DETECTED NOT DETECTED Final   Norovirus GI/GII NOT DETECTED NOT DETECTED Final   Rotavirus A NOT DETECTED NOT DETECTED Final   Sapovirus (I, II, IV, and V) NOT DETECTED NOT DETECTED Final    Comment: Performed at [facility], [address] [place], [place] [address]  MRSA PCR Screening     Status: None   Collection Time: 05/13/21  9:30 AM   Specimen: Nasopharyngeal  Result  Value Ref Range Status   MRSA by PCR NEGATIVE NEGATIVE Final    Comment:        The GeneXpert MRSA Assay (FDA approved for NASAL specimens only), is one component of a comprehensive MRSA colonization surveillance program. It is not intended to diagnose MRSA infection nor to guide or monitor treatment for MRSA infections. Performed at [facility], [place]  [address] [place], [place] [address]      Radiology Studies: DG CHEST PORT 1 VIEW  Result Date: 05/14/2021 CLINICAL DATA:  PICC line placement. EXAM: PORTABLE CHEST 1 VIEW COMPARISON:  Chest x-ray 05/12/2021. FINDINGS: PICC line noted with tip over SVC. Cardiac pacer noted with lead tips in right atrium right ventricle in stable position. Stable cardiomegaly. Mild bilateral interstitial prominence, improved from prior exam. Findings suggest improving interstitial edema and or pneumonitis. No pleural effusion or pneumothorax. Carotid vascular calcification cannot be excluded. IMPRESSION: 1.  PICC line noted with tip over SVC. 2.  Cardiac pacer stable position.  Stable cardiomegaly. 3. Mild bilateral interstitial prominence, improved from prior exam. Findings suggest improving interstitial edema and or pneumonitis. 4.  Carotid vascular disease cannot be excluded. Electronically Signed   By: [place]   On: 05/14/2021 16:29    Scheduled Meds: . atorvastatin  40 mg Oral QHS  . azithromycin  500 mg Oral Daily  . Chlorhexidine Gluconate Cloth  6 each Topical Q0600  . colchicine  0.6 mg Oral BID  . heparin  5,000 Units Subcutaneous Q8H  . insulin aspart  0-5 Units Subcutaneous QHS  . insulin aspart  0-9 Units Subcutaneous TID WC  . pantoprazole  40 mg Oral Daily  . PARoxetine  20 mg Oral Daily  . predniSONE  40 mg Oral Q breakfast  . sodium chloride flush  10-40 mL Intracatheter Q12H   Continuous Infusions: . sodium chloride 10 mL/hr at 05/15/21 0039  . ceFEPime (MAXIPIME) IV 2 g (05/15/21 1430)  . metronidazole 500 mg (05/15/21  1500)    LOS: 3 days   Roxan Hockey, MD How to contact the [facility] Attending or Consulting provider [place] or covering provider during after hours [place], for this patient?  1. Check the care team in [facility] and look for a) attending/consulting TRH provider listed and b) the [facility] team listed 2. Log into [email or web address] and use [place]'s universal password to access. If you do not have the password, please contact the hospital operator. 3. Locate the [facility] provider you are looking for under [facility] and page to a number that you can be directly reached. 4. If you still have difficulty reaching the provider, please page the [facility] (Director on Call) for the Hospitalists listed on amion for assistance.  05/15/2021, 5:47 PM

## 2021-05-15 NOTE — Progress Notes (Signed)
Progress Note  Patient Name: Roger Mooney Date of Encounter: 05/15/2021  [facility] Cardiologist: New  Subjective   Some ongoing SOB, chest pain.   Inpatient Medications    Scheduled Meds: . atorvastatin  40 mg Oral q1800  . azithromycin  500 mg Oral Daily  . Chlorhexidine Gluconate Cloth  6 each Topical Q0600  . colchicine  0.6 mg Oral BID  . heparin  5,000 Units Subcutaneous Q8H  . insulin aspart  0-5 Units Subcutaneous QHS  . insulin aspart  0-9 Units Subcutaneous TID WC  . pantoprazole  40 mg Oral Daily  . PARoxetine  20 mg Oral Daily  . sodium chloride flush  10-40 mL Intracatheter Q12H   Continuous Infusions: . sodium chloride 10 mL/hr at 05/15/21 0039  . ceFEPime (MAXIPIME) IV 2 g (05/14/21 2300)  . metronidazole 500 mg (05/15/21 0527)   PRN Meds: sodium chloride, acetaminophen **OR** acetaminophen, albuterol, ondansetron **OR** ondansetron (ZOFRAN) IV, oxyCODONE, polyethylene glycol, sodium chloride flush   Vital Signs    Vitals:   05/14/21 2300 05/14/21 2357 05/15/21 0000 05/15/21 0347  BP: (!) 91/50  101/68   Pulse: 69  63   Resp:      Temp:  98.8 F (37.1 C)  98.5 F (36.9 C)  TempSrc:  Oral  Oral  SpO2: 94%  95%   Weight:    79.2 kg  Height:        Intake/Output Summary (Last 24 hours) at 05/15/2021 0717 Last data filed at 05/15/2021 0347 Gross per 24 hour  Intake 842.42 ml  Output 1100 ml  Net -257.58 ml   Last 3 Weights 05/15/2021 05/14/2021 05/13/2021  Weight (lbs) 174 lb 9.7 oz 173 lb 8 oz 165 lb 9.1 oz  Weight (kg) 79.2 kg 78.7 kg 75.1 kg      Telemetry    SR, A pacing v sensed- Personally Reviewed  ECG    n/a - Personally Reviewed  Physical Exam   GEN: No acute distress.   Neck: No JVD Cardiac: RRR, no murmurs, rubs, or gallops.  Respiratory: Clear to auscultation bilaterally. GI: Soft, nontender, non-distended  MS: No edema; No deformity. Neuro:  Nonfocal  Psych: Normal affect   Labs    High Sensitivity Troponin:    Recent Labs  Lab 05/12/21 1201 05/12/21 1344  TROPONINIHS 9 9      Chemistry Recent Labs  Lab 05/12/21 1201 05/13/21 0447 05/14/21 0432  NA 132* 135  --   K 4.4 4.2  --   CL 103 107  --   CO2 23 19*  --   GLUCOSE 144* 109*  --   BUN 23 20  --   CREATININE 1.62* 1.25* 1.13  CALCIUM 8.4* 8.4*  --   PROT 7.5  --   --   ALBUMIN 3.5  --   --   AST 16  --   --   ALT 13  --   --   ALKPHOS 55  --   --   BILITOT 3.1*  --   --   GFRNONAA 46* >60 >60  ANIONGAP 6 9  --      Hematology Recent Labs  Lab 05/12/21 1201 05/13/21 0447 05/14/21 0432  WBC 21.3* 18.2* 15.2*  RBC 5.20 4.82 4.37  HGB 13.0 12.2* 11.2*  HCT 42.6 40.2 36.8*  MCV 81.9 83.4 84.2  MCH 25.0* 25.3* 25.6*  MCHC 30.5 30.3 30.4  RDW 19.1* 18.8* 18.6*  PLT 267 245 227  BNP Recent Labs  Lab 05/12/21 1201 05/14/21 0432  BNP 44.0 106.0*     DDimer  Recent Labs  Lab 05/12/21 1201  DDIMER 0.99*     Radiology    DG CHEST PORT 1 VIEW  Result Date: 05/14/2021 CLINICAL DATA:  PICC line placement. EXAM: PORTABLE CHEST 1 VIEW COMPARISON:  Chest x-ray 05/12/2021. FINDINGS: PICC line noted with tip over SVC. Cardiac pacer noted with lead tips in right atrium right ventricle in stable position. Stable cardiomegaly. Mild bilateral interstitial prominence, improved from prior exam. Findings suggest improving interstitial edema and or pneumonitis. No pleural effusion or pneumothorax. Carotid vascular calcification cannot be excluded. IMPRESSION: 1.  PICC line noted with tip over SVC. 2.  Cardiac pacer stable position.  Stable cardiomegaly. 3. Mild bilateral interstitial prominence, improved from prior exam. Findings suggest improving interstitial edema and or pneumonitis. 4.  Carotid vascular disease cannot be excluded. Electronically Signed   By: Marcello Moores  Register   On: 05/14/2021 16:29   ECHOCARDIOGRAM COMPLETE  Result Date: 05/13/2021    ECHOCARDIOGRAM REPORT   Patient Name:   Roger Mooney Date of Exam:  05/13/2021 Medical Rec #:  [id number]      Height:       71.0 in Accession #:    [id number]     Weight:       165.6 lb Date of Birth:  Feb 08, 1951      BSA:          1.946 m Patient Age:    70 years       BP:           113/79 mmHg Patient Gender: M              HR:           67 bpm. Exam Location:  [facility] Procedure: 2D Echo, Cardiac Doppler and Color Doppler Indications:    Chest Pain, R07.9  History:        Patient has prior history of Echocardiogram examinations, most                 recent 03/27/2021. CHF; Risk Factors:Diabetes and Hypertension.                 Hx of LV (left ventricular) mural thrombus, AICD.  Sonographer:    Alvino Chapel RCS Referring Phys: [id number] [place]  1. Left ventricular ejection fraction, by estimation, is 20 to 25%. The left ventricle has severely decreased function. The left ventricle demonstrates global hypokinesis. The left ventricular internal cavity size was mildly dilated. Left ventricular diastolic parameters are indeterminate.  2. Right ventricular systolic function is normal. The right ventricular size is normal.  3. Left atrial size was mildly dilated.  4. A small pericardial effusion is present. The pericardial effusion is circumferential.  5. The mitral valve is normal in structure. No evidence of mitral valve regurgitation. No evidence of mitral stenosis.  6. The aortic valve has an indeterminant number of cusps. Aortic valve regurgitation is not visualized. No aortic stenosis is present.  7. The inferior vena cava is normal in size with greater than 50% respiratory variability, suggesting right atrial pressure of 3 mmHg. FINDINGS  Left Ventricle: Globabl hypokinesis, the apex is akinetic. There is no evidence of LV thrombus, echocontrast was used. Left ventricular ejection fraction, by estimation, is 20 to 25%. The left ventricle has severely decreased function. The left ventricle demonstrates global hypokinesis. Definity contrast agent was given IV  to  delineate the left ventricular endocardial borders. The left ventricular internal cavity size was mildly dilated. There is no left ventricular hypertrophy. Left ventricular diastolic parameters are indeterminate. Right Ventricle: The right ventricular size is normal. No increase in right ventricular wall thickness. Right ventricular systolic function is normal. Left Atrium: Left atrial size was mildly dilated. Right Atrium: Right atrial size was normal in size. Pericardium: A small pericardial effusion is present. The pericardial effusion is circumferential. Mitral Valve: The mitral valve is normal in structure. No evidence of mitral valve regurgitation. No evidence of mitral valve stenosis. Tricuspid Valve: The tricuspid valve is normal in structure. Tricuspid valve regurgitation is not demonstrated. No evidence of tricuspid stenosis. Aortic Valve: The aortic valve has an indeterminant number of cusps. Aortic valve regurgitation is not visualized. No aortic stenosis is present. Aortic valve mean gradient measures 3.1 mmHg. Aortic valve peak gradient measures 5.4 mmHg. Aortic valve area, by VTI measures 3.79 cm. Pulmonic Valve: The pulmonic valve was not well visualized. Pulmonic valve regurgitation is not visualized. No evidence of pulmonic stenosis. Aorta: The aortic root is normal in size and structure. Pulmonary Artery: Indeterminate PASP, inadequate TR jet. Venous: The inferior vena cava is normal in size with greater than 50% respiratory variability, suggesting right atrial pressure of 3 mmHg. IAS/Shunts: No atrial level shunt detected by color flow Doppler.  LEFT VENTRICLE PLAX 2D LVIDd:         6.00 cm  Diastology LVIDs:         5.00 cm  LV e' medial:    7.51 cm/s LV PW:         0.90 cm  LV E/e' medial:  9.7 LV IVS:        0.90 cm  LV e' lateral:   8.05 cm/s LVOT diam:     2.50 cm  LV E/e' lateral: 9.1 LV SV:         80 LV SV Index:   41 LVOT Area:     4.91 cm  RIGHT VENTRICLE RV S prime:     8.92 cm/s  TAPSE (M-mode): 1.2 cm LEFT ATRIUM             Index       RIGHT ATRIUM           Index LA diam:        3.10 cm 1.59 cm/m  RA Area:     15.30 cm LA Vol (A2C):   78.2 ml 40.19 ml/m RA Volume:   41.20 ml  21.17 ml/m LA Vol (A4C):   46.9 ml 24.10 ml/m LA Biplane Vol: 61.1 ml 31.40 ml/m  AORTIC VALVE AV Area (Vmax):    3.53 cm AV Area (Vmean):   3.08 cm AV Area (VTI):     3.79 cm AV Vmax:           116.30 cm/s AV Vmean:          83.240 cm/s AV VTI:            0.211 m AV Peak Grad:      5.4 mmHg AV Mean Grad:      3.1 mmHg LVOT Vmax:         83.60 cm/s LVOT Vmean:        52.200 cm/s LVOT VTI:          0.163 m LVOT/AV VTI ratio: 0.77  AORTA Ao Root diam: 3.80 cm MITRAL VALVE MV Area (PHT): 4.60 cm    SHUNTS MV Decel  Time: 165 msec    Systemic VTI:  0.16 m MV E velocity: 73.00 cm/s  Systemic Diam: 2.50 cm MV A velocity: 77.50 cm/s MV E/A ratio:  0.94 Carlyle Dolly MD Electronically signed by Carlyle Dolly MD Signature Date/Time: 05/13/2021/2:35:26 PM    Final    [place] EKG SITE RITE  Result Date: 05/13/2021 If Site Rite image not attached, placement could not be confirmed due to current cardiac rhythm.   Cardiac Studies     Patient Profile     70 yo male history of HTN, DM2, pacemaker, polysubstance abuse including cocaine, presents with chest pain. History of medical noncompliance, medical care across multiple health systems including [place], [facility], [place]. Consulted for chest pain.  Assessment & Plan    1. Hypotension - given IVFs in ER - emperic treatment for possible sepsis, signifiacnt leukocytosis on admission -SBPs 90s to 110s over last 24 hours. COOX 63% and 66% which is sufficent, CVP 13  - overall current bp's are currently reasonable given his low EF   2. Chronic systolic HF - echo 12% by recent echos at [place]. Has AICD - long history of HF, seen previously at multiple medical centers so exact history somewhat unclear - BNP 44-->106, though device check suggested  from fluid overload. CXR and CT without significant edema.  - coox 63% and 66%, CVP 13. WIll dose IV lasix 53m x 1 - soft bp's limit medical therapy. He is not a candidate for advanced heart failure therapies  - does not appear significantl volume overloaded by exam. CVP mildly elevated with some ongoing SOB, will dose IV lasix 469mx 1 today.   3. History of CAD prior LAD stent - presented with atypical chest pain - [facility]ps negative [facility] (unknown location) with patent vessels - with fairly recent cath, extensitve history of noncompliance would not consider ischemic evaluation in absence of clear objective evidence of ischemia  4. Chest pain - trops negative - CT with suggestion of pericardial thickening, elevated ESR and CRP. EKG not consistent wit pericardititis, no rub on exam.  - echo LVEF 25%, small pericardial effusion no tamponade - reasonable to try a course of colchicine (of note already with some loose stools prior to starting). Would avoid NSAIDs given his LV dysfunction and CAD. Depending on clinical course could consider a course of steroids. Ongoing chest pain, can be positional, some tenderness to palpation. Add 5 day course of prednsione, would plan  3 months colchcine    5. ICD - normal device check in ER by med[employer]onic rep, though suggested evidence of fluid accumulation.   6. LV thrombus, [facility]. Subtherapeutic INR on admission, long history of medication noncompliance - repeat echo shows no evidence of thrombus, given resolution and poor coumadin compliance will d/c anticoag  7. Abnormal Lung CT - concern for possible ILD, needs high res CT at f/u  8. Polysubstance abuse  9. Leukocytosis - on vanc/cefepime per primary team - WBC trendign down, check procalcitonin  Multiple advanced comorbidities with extensive history of poor compliance, palliative has been  consulted.   For questions or updates, please contact [place] consult [email or web address] for contact info under        Signed, BrCarlyle DollyMD  05/15/2021, 7:17 AM

## 2021-05-16 DIAGNOSIS — I5043 Acute on chronic combined systolic (congestive) and diastolic (congestive) heart failure: Secondary | ICD-10-CM | POA: Diagnosis present

## 2021-05-16 DIAGNOSIS — Z95810 Presence of automatic (implantable) cardiac defibrillator: Secondary | ICD-10-CM | POA: Diagnosis present

## 2021-05-16 DIAGNOSIS — I2510 Atherosclerotic heart disease of native coronary artery without angina pectoris: Secondary | ICD-10-CM | POA: Diagnosis present

## 2021-05-16 DIAGNOSIS — A419 Sepsis, unspecified organism: Secondary | ICD-10-CM | POA: Diagnosis present

## 2021-05-16 DIAGNOSIS — F1910 Other psychoactive substance abuse, uncomplicated: Secondary | ICD-10-CM | POA: Diagnosis present

## 2021-05-16 LAB — RENAL FUNCTION PANEL
Albumin: 3 g/dL — ABNORMAL LOW (ref 3.5–5.0)
Anion gap: 8 (ref 5–15)
BUN: 20 mg/dL (ref 8–23)
CO2: 22 mmol/L (ref 22–32)
Calcium: 8.4 mg/dL — ABNORMAL LOW (ref 8.9–10.3)
Chloride: 106 mmol/L (ref 98–111)
Creatinine, Ser: 1.08 mg/dL (ref 0.61–1.24)
GFR, Estimated: >60 mL/min (ref >60)
Glucose, Bld: 97 mg/dL (ref 70–99)
Phosphorus: 2.1 mg/dL — ABNORMAL LOW (ref 2.5–4.6)
Potassium: 3.4 mmol/L — ABNORMAL LOW (ref 3.5–5.1)
Sodium: 136 mmol/L (ref 135–145)

## 2021-05-16 LAB — CREATININE, SERUM
Creatinine, Ser: 1.07 mg/dL (ref 0.61–1.24)
GFR, Estimated: >60 mL/min (ref >60)

## 2021-05-16 LAB — CBC
HCT: 37.1 % — ABNORMAL LOW (ref 39.0–52.0)
Hemoglobin: 11.5 g/dL — ABNORMAL LOW (ref 13.0–17.0)
MCH: 25.4 pg — ABNORMAL LOW (ref 26.0–34.0)
MCHC: 31 g/dL (ref 30.0–36.0)
MCV: 82.1 fL (ref 80.0–100.0)
Platelets: 293 10*3/uL (ref 150–400)
RBC: 4.52 MIL/uL (ref 4.22–5.81)
RDW: 18.4 % — ABNORMAL HIGH (ref 11.5–15.5)
WBC: 16 10*3/uL — ABNORMAL HIGH (ref 4.0–10.5)
nRBC: 0 % (ref 0.0–0.2)

## 2021-05-16 LAB — PROCALCITONIN: Procalcitonin: <0.1 ng/mL

## 2021-05-16 MED ORDER — FUROSEMIDE 20 MG PO TABS: 20.0000 mg | ORAL_TABLET | ORAL | 11 refills | Status: DC

## 2021-05-16 MED ORDER — METOPROLOL SUCCINATE ER 25 MG PO TB24
12.5000 mg | ORAL_TABLET | Freq: Every day | ORAL | Status: DC
  Administered 2021-05-16: 12.5 mg via ORAL
  Filled 2021-05-16: qty 1

## 2021-05-16 MED ORDER — ASPIRIN 81 MG PO TBEC: 81.0000 mg | DELAYED_RELEASE_TABLET | Freq: Every day | ORAL | 11 refills | Status: DC

## 2021-05-16 MED ORDER — PANTOPRAZOLE SODIUM 40 MG PO TBEC: 40.0000 mg | DELAYED_RELEASE_TABLET | Freq: Every day | ORAL | 3 refills | Status: DC

## 2021-05-16 MED ORDER — METOPROLOL SUCCINATE ER 25 MG PO TB24: 12.5000 mg | ORAL_TABLET | Freq: Every day | ORAL | 3 refills | Status: DC

## 2021-05-16 MED ORDER — PREDNISONE 20 MG PO TABS: 40.0000 mg | ORAL_TABLET | Freq: Every day | ORAL | 0 refills | Status: AC

## 2021-05-16 MED ORDER — ACETAMINOPHEN 325 MG PO TABS: 650.0000 mg | ORAL_TABLET | Freq: Four times a day (QID) | ORAL | 0 refills | Status: AC | PRN

## 2021-05-16 MED ORDER — METFORMIN HCL 500 MG PO TABS: 500.0000 mg | ORAL_TABLET | Freq: Two times a day (BID) | ORAL | 5 refills | Status: DC

## 2021-05-16 MED ORDER — CEFDINIR 300 MG PO CAPS: 300.0000 mg | ORAL_CAPSULE | Freq: Two times a day (BID) | ORAL | 0 refills | Status: AC

## 2021-05-16 MED ORDER — COLCHICINE 0.6 MG PO TABS: 0.6000 mg | ORAL_TABLET | Freq: Two times a day (BID) | ORAL | 3 refills | Status: DC

## 2021-05-16 MED ORDER — ATORVASTATIN CALCIUM 40 MG PO TABS: 1.0000 | ORAL_TABLET | Freq: Every day | ORAL | 4 refills | Status: AC

## 2021-05-16 MED ORDER — ASPIRIN EC 81 MG PO TBEC
81.0000 mg | DELAYED_RELEASE_TABLET | Freq: Every day | ORAL | Status: DC
  Administered 2021-05-16: 81 mg via ORAL
  Filled 2021-05-16: qty 1

## 2021-05-16 MED ORDER — PAROXETINE HCL 20 MG PO TABS: 20.0000 mg | ORAL_TABLET | Freq: Every day | ORAL | 4 refills | Status: AC

## 2021-05-16 MED ORDER — POTASSIUM CHLORIDE CRYS ER 20 MEQ PO TBCR
40.0000 meq | EXTENDED_RELEASE_TABLET | ORAL | Status: DC
  Administered 2021-05-16: 40 meq via ORAL
  Filled 2021-05-16: qty 2

## 2021-05-16 NOTE — TOC Transition Note (Signed)
Transition of Care [facility]) - CM/SW Discharge Note  Patient Details  Name: Roger Mooney MRN: [id number] Date of Birth: 26-Apr-1951  Transition of Care [facility]) CM/SW Contact:  Ewing Schlein, LCSW Phone Number: 05/16/2021, 1:17 PM  Clinical Narrative: Patient to discharge today and is anxious to leave. CSW set up medications to be delivered to patient's hospital room by [facility], but patient left prior to delivery. CSW notified [facility] and the medications will now be delivered to his home. CSW attempted to make an OP palliative referral to [facility], but as patient does not have a PCP, the referral was only accepted pending he finds a PCP. TOC signing off.  Final next level of care: Home/Self Care Barriers to Discharge: Barriers Resolved  Patient Goals and CMS Choice Choice offered to / list presented to : NA  Discharge Plan and Services        DME Arranged: N/A DME Agency: NA  Readmission Risk Interventions No flowsheet data found.

## 2021-05-16 NOTE — Progress Notes (Signed)
Progress Note  Patient Name: Roger Mooney Date of Encounter: 05/16/2021  [facility] Cardiologist: Bea Graff  Subjective   SOB improving, some ongoing chest pain though trending down  Inpatient Medications    Scheduled Meds: . atorvastatin  40 mg Oral QHS  . azithromycin  500 mg Oral Daily  . Chlorhexidine Gluconate Cloth  6 each Topical Q0600  . colchicine  0.6 mg Oral BID  . heparin  5,000 Units Subcutaneous Q8H  . insulin aspart  0-5 Units Subcutaneous QHS  . insulin aspart  0-9 Units Subcutaneous TID WC  . pantoprazole  40 mg Oral Daily  . PARoxetine  20 mg Oral Daily  . predniSONE  40 mg Oral Q breakfast  . sodium chloride flush  10-40 mL Intracatheter Q12H   Continuous Infusions: . sodium chloride 10 mL/hr at 05/16/21 0042  . ceFEPime (MAXIPIME) IV 2 g (05/16/21 0502)  . metronidazole 500 mg (05/16/21 0616)   PRN Meds: sodium chloride, acetaminophen **OR** acetaminophen, albuterol, ondansetron **OR** ondansetron (ZOFRAN) IV, oxyCODONE, polyethylene glycol, sodium chloride flush   Vital Signs    Vitals:   05/15/21 2300 05/15/21 2338 05/16/21 0000 05/16/21 0502  BP: 100/63  104/71   Pulse: (!) 42  (!) 102   Resp: 17  (!) 32   Temp:  (!) 97.4 F (36.3 C)  98 F (36.7 C)  TempSrc:  Axillary  Oral  SpO2: 94%  100%   Weight:    76.8 kg  Height:        Intake/Output Summary (Last 24 hours) at 05/16/2021 0832 Last data filed at 05/16/2021 0042 Gross per 24 hour  Intake 878.3 ml  Output 1225 ml  Net -346.7 ml   Last 3 Weights 05/16/2021 05/15/2021 05/14/2021  Weight (lbs) 169 lb 5 oz 174 lb 9.7 oz 173 lb 8 oz  Weight (kg) 76.8 kg 79.2 kg 78.7 kg      Telemetry    SR, A pacing V sensed - Personally Reviewed  ECG    n/a - Personally Reviewed  Physical Exam   GEN: No acute distress.   Neck: No JVD Cardiac: RRR, no murmurs, rubs, or gallops.  Respiratory: Clear to auscultation bilaterally. GI: Soft, nontender, non-distended  MS: No edema; No  deformity. Neuro:  Nonfocal  Psych: Normal affect   Labs    High Sensitivity Troponin:   Recent Labs  Lab 05/12/21 1201 05/12/21 1344  TROPONINIHS 9 9      Chemistry Recent Labs  Lab 05/12/21 1201 05/13/21 0447 05/14/21 0432 05/16/21 0436  NA 132* 135  --  136  K 4.4 4.2  --  3.4*  CL 103 107  --  106  CO2 23 19*  --  22  GLUCOSE 144* 109*  --  97  BUN 23 20  --  20  CREATININE 1.62* 1.25* 1.13 1.07  1.08  CALCIUM 8.4* 8.4*  --  8.4*  PROT 7.5  --   --   --   ALBUMIN 3.5  --   --  3.0*  AST 16  --   --   --   ALT 13  --   --   --   ALKPHOS 55  --   --   --   BILITOT 3.1*  --   --   --   GFRNONAA 46* >60 >60 >60  >60  ANIONGAP 6 9  --  8     Hematology Recent Labs  Lab 05/13/21 0447 05/14/21 [phone]  05/16/21 0436  WBC 18.2* 15.2* 16.0*  RBC 4.82 4.37 4.52  HGB 12.2* 11.2* 11.5*  HCT 40.2 36.8* 37.1*  MCV 83.4 84.2 82.1  MCH 25.3* 25.6* 25.4*  MCHC 30.3 30.4 31.0  RDW 18.8* 18.6* 18.4*  PLT 245 227 293    BNP Recent Labs  Lab 05/12/21 1201 05/14/21 0432  BNP 44.0 106.0*     DDimer  Recent Labs  Lab 05/12/21 1201  DDIMER 0.99*     Radiology    DG CHEST PORT 1 VIEW  Result Date: 05/14/2021 CLINICAL DATA:  PICC line placement. EXAM: PORTABLE CHEST 1 VIEW COMPARISON:  Chest x-ray 05/12/2021. FINDINGS: PICC line noted with tip over SVC. Cardiac pacer noted with lead tips in right atrium right ventricle in stable position. Stable cardiomegaly. Mild bilateral interstitial prominence, improved from prior exam. Findings suggest improving interstitial edema and or pneumonitis. No pleural effusion or pneumothorax. Carotid vascular calcification cannot be excluded. IMPRESSION: 1.  PICC line noted with tip over SVC. 2.  Cardiac pacer stable position.  Stable cardiomegaly. 3. Mild bilateral interstitial prominence, improved from prior exam. Findings suggest improving interstitial edema and or pneumonitis. 4.  Carotid vascular disease cannot be excluded.  Electronically Signed   By: Marcello Moores  Register   On: 05/14/2021 16:29    Cardiac Studies     Patient Profile        70 yo male history of HTN, DM2, pacemaker, polysubstance abuse including cocaine, presents with chest pain. History of medical noncompliance, medical care across multiple health systems including [place], [facility], [place]. Consulted for chest pain.  Assessment & Plan    1. Hypotension - bps have improved since admission, SBPs now low 100-120s - COOX has been in low 60% which is adequate, CVP 6 this AM. Normal AM cortisol, had some early signs of sepsis that are improving/resolving - likely sepsis on admission that is resolving.    2. Chronic systolic HF - echo 54% by recent echos at [place]. Has AICD - long history of HF, seen previously at multiple medical centers so exact history somewhat unclear - BNP 44-->106, though device check suggested from fluid overload. CXRand CTwithout significant edema. CVP this AM is 6  - coox 63% and 66%,   - soft bp's limited CHF medical therapy.  Can try restarting his toprol 12.45m daily today. Will not tolerate any additoinal meds. Normal filling pressures, no further diuresis.   3. History of CAD prior LAD stent - presented with atypical chest pain - [facility] negative [facility] (unknown location) with patent vessels - with fairly recent cath, extensitve history of noncompliance would not consider ischemic evaluation in absence of clear objective evidence of ischemia  4. Chest pain - trops negative - CT with suggestion of pericardial thickening, elevated ESR and CRP. EKG not consistent wit pericardititis, no rub on exam. - echo LVEF 25%, small pericardial effusion no tamponade - reasonable to try a course of colchicine for possible pericardiits based on chest pain, CT findings, pericardial effusion elevated inflammatory markers . Would avoid NSAIDs given his LV dysfunction  and CAD. - plan for colchicine 0.666mbid x 3 months, 5 days prednisone 4018maily.     5. ICD - normal device check in ER by med[employer] rep, though suggested evidence of fluid accumulation.   6. [facility]the[facility], long history of medication noncompliance - repeat echo shows  no evidence of thrombus, given resolution and poor coumadin compliance will d/c anticoag  7. Abnormal Lung CT - concern for possible ILD, needs high res CT as outpatient  8. Polysubstance abuse    No further cardiology recs at this time, we will sign of inpatient care.He wishes to f/u with [place] in clinic, we will arrange.    For questions or updates, please contact [place] Please consult [email or web address] for contact info under        Signed, Carlyle Dolly, MD  05/16/2021, 8:32 AM

## 2021-05-16 NOTE — Progress Notes (Signed)
Discharge instructions reviewed with patient and his daughter and son. Teach back and medication review completed. All questions answered. All belongings accounted for. meds sent to [facility]. picc line removed from right upper arm without incident. Dressing placed, cdi. Patient education on monitoring for s/sx of bleeding.

## 2021-05-16 NOTE — Discharge Instructions (Signed)
1)Very low-salt diet advised 2)Weigh yourself daily, call if you gain more than 3 pounds in 1 day or more than 5 pounds in 1 week as your diuretic medications may need to be adjusted 3)Limit your Fluid  intake to no more than 60 ounces (1.8 Liters) per day 4) you have a pretty weak heart--- complete abstinence from cocaine strongly advised 5) please take medications as prescribed 6) please take Lasix 20 mg daily (fluid medicine) for at least 2 days if you gain more than 3 pounds in 1 day or more than 5 pounds in a week 7) please follow-up with cardiologist as outpatient for recheck and reevaluation unless advised 8) Please note that there has been several changes to your medications

## 2021-05-16 NOTE — Discharge Summary (Signed)
Roger Mooney, is a 70 y.o. male  DOB [birth year]  MRN [id number].  Admission date:  05/12/2021  Admitting Physician  Bethena Roys, MD  Discharge Date:  05/16/2021   Primary MD  Pcp, No  Recommendations for primary care physician for things to follow:   1)Very low-salt diet advised 2)Weigh yourself daily, call if you gain more than 3 pounds in 1 day or more than 5 pounds in 1 week as your diuretic medications may need to be adjusted 3)Limit your Fluid  intake to no more than 60 ounces (1.8 Liters) per day 4) you have a pretty weak heart--- complete abstinence from cocaine strongly advised 5) please take medications as prescribed 6) please take Lasix 20 mg daily (fluid medicine) for at least 2 days if you gain more than 3 pounds in 1 day or more than 5 pounds in a week 7) please follow-up with cardiologist as outpatient for recheck and reevaluation unless advised 8) Please note that there has been several changes to your medications   Admission Diagnosis  Precordial chest pain [R07.2] Hypotension [I95.9]   Discharge Diagnosis  Precordial chest pain [R07.2] Hypotension [I95.9]    Principal Problem:   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 coli sepsis with septic shock ---    CAD-status post prior LAD stent--- last LHC 12/02/2020, with patent vessels apparently   Hypotension   HTN (hypertension)   DM (diabetes mellitus) ([place])   LV (left ventricular) mural thrombus   AKI (acute kidney injury) ([place])      Past Medical History:  Diagnosis Date  . Diabetes mellitus without complication ([place])   . Hypertension     Past Surgical History:  Procedure Laterality Date  . PACEMAKER IMPLANT       HPI   from the history and physical done on the day of admission:   Chief Complaint: Chest pain  HPI: Roger Mooney is a 70 y.o. male with medical history significant for LV thrombus, chronic systolic heart failure EF of 25%, AICD, CKD, diabetes mellitus.    Patient presented to the ED today with complaints of chest pain.  History from patient is limited as patient is a poor historian.  Patient reports onset of chest pain over the past 2 days, intermittent.  Reports improvement in chest pain for with pain medications, he is unaware of any aggravating factors. He Is unable to tell me if chest pain is related to activity.  He reports associated difficulty breathing with chest pains.  Reports chest pain is both on the right central and left side of his chest.  No associated nausea or vomiting.  No loose stools.  He reports appetite has been okay. On my evaluation, patient was getting easily agitated and talking in a more aggressive voice when trying to answer questions.  Patient used to live in [place], moved to the area and  lives with his granddaughter Roger Mooney.  Patient was recently hospitalized at [facility] for 4/14-4/41 and prior to that at [facility] 4/9 through 4/14-where he was managed for respiratory failure secondary to aspiration pneumonia with septic shock and requiring mechanical ventilation and Levophed.  UDS was positive for benzos, cocaine, opiates and THC.  Hospitalization was complicated by development of vascular congestion/fluid overload requiring Lasix, ventricular tachycardia requiring amiodarone, on CKD with creatinine up to 1.5, discharge creatinine of 1.19.  An LV thrombus was found on echocardiogram 03/27/2021, requiring warfarin.  Patient required CIWA for alcohol withdrawal.  Patient was subsequently transferred to tertiary center-[facility] for further care. The only culture results I can see-  tracheal aspirate which showed light growth of yeast, 2  colonies of yeast.  ED Course: T max- 99.9, heart rate initially 120s improved to 70s, respiratory rate 16- 31.  Blood pressure systolic dropped to 40/98, was persistent despite 2 L bolus of fluid given.  WBC 21.  Troponin 9.  BNP 44.  EKG shows sinus tachycardia rate 116.  Creatinine elevated 1.6.  CTA chest - negative for PE, showed extensive pericardial thickening and small volume of pericardial fluid.  Clinical correlation for signs or symptoms of acute pericarditis recommended.   EDP talked to cardiologist on-call, Dr. Marlou Porch, he did not feel that the pericardial thickening and small pleural effusion fit the picture of the patient's clinical condition, and did not think this required transfer to [facility] for admission.  I was initially called to admit patient, with systolic in the 11B status post 2 L bolus, now on 125 cc/h Ringer's lactate, EF of 25%, recent septic shock and possible infectious etiology for this presentation, I requested central line placement.  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 -Overall much improved after above measures -No hypoxia no  significant dyspnea at rest or with activity -Discharge on low-dose lisinopril, low-dose Toprol, low-dose Lasix as well as aspirin and Lipitor  2)Severe sepsis with septic shock --- blood and urine cultures from 05/12/21 NGTD -Stool culture/GI pathogen from 05/13/2021 with both enteroaggregate of E. coli AND enteropathogenic E. Coli -Azithromycin 500 mg daily for 3 days for E. coli in stool as above -Sepsis pathophysiology noted(patient initially had tachypnea, leukocytosis, tachycardia hypotension and hypoxia),  Treated with  Vanco, cefepime and Flagyl---blood cultures still negative Elevated ESR and elevated CRP noted -Leukocytosis persisted in the setting of steroid therapy PCT 0.11 >> , repeat PCT << 0.10 -Stool culture with Enterococcus gait even enteropathogenic E. coli okay to discharge on p.o. Omnicef   3)Social/Ethics --patient with ongoing recreational drug use including cocaine, significant comorbidities and high risk for decompensation  -Palliative consult appreciated patient is a full code -Chaplain had repeated conversations with patient patient more cooperative after talking with  chaplain -Outpatient drug rehab programs advised -Advised to sign up with a  new PCP so we can have him health and palliative care follow-up post discharge  4)Hypotension--- suspect this is mostly due to #2 above, #1 above is contributory -AM cortisol 14.8 --Given low EF his BP may run low -BP stabilized  5)AKI----acute kidney injury -resolved with hydration and improvement in BP -   creatinine on admission= 1.62 , baseline creatinine = wnl    ,  -creatinine is now=1.0  ,  --renally adjust medications, avoid nephrotoxic agents / dehydration  / hypotension  6)DM2--okay to restart metformin    7)LV thrombus--discharged from [facility] March 27, 2021 on Coumadin, patient not compliant, INR was subtherapeutic repeat echo without LV thrombus, cardiology recommends no further anticoagulation  especially given noncompliance  8)Polysubstance abuse - USD positive for benzo and THC.  -Patient admits to ongoing cocaine use  9)PULM--- possible ILD, please see CT chest findings -Patient will need to follow-up pulmonologist as outpatient, patient may need high-resolution CT chest  10)H/o CAD--status post prior LAD stent--- last LHC 12/02/2020, with patent vessels apparently -Given ongoing cocaine abuse and history of noncompliance cardiology team does not believe patient is a good candidate for further invasive testing at this time -Patient has ruled out for ACS by cardiac enzymes and EKG at this time -Outpatient follow-up with cardiology advised   Code Status: full  Family Communication: plan of care discussed with patient at bedside Disposition: home    Dispo: The patient is from: Home  Anticipated d/c is to: Home   Consultants:   Cardiology  Palliative care   Chaplain services  Procedures:   PICC placement 05/14/21  Antimicrobials:  Cefepime 5/30> vanc  5/30  Discharge Condition: stable  Follow UP   Follow-up Information    Imogene Burn, PA-C Follow up on 06/23/2021.   Specialty: Cardiology Why: [facility]iology Hospital Follow-up on 06/23/2021 at 12:30 PM.  Contact information: [place] [address] [phone]               Diet and Activity recommendation:  As advised  Discharge Instructions    Discharge Instructions    Call MD for:  difficulty breathing, headache or visual disturbances   Complete by: As directed    Call MD for:  persistant nausea and vomiting   Complete by: As directed    Call MD for:  severe uncontrolled pain   Complete by: As directed    Call MD for:  temperature >100.4   Complete by: As directed    Diet - low sodium heart healthy   Complete by: As directed    Diet Carb Modified   Complete by: As directed    Discharge instructions   Complete by: As directed    1)Very  low-salt diet advised 2)Weigh yourself daily, call if you gain more than 3 pounds in 1 day or more than 5 pounds in 1 week as your diuretic medications may need to be adjusted 3)Limit your Fluid  intake to no more than 60 ounces (1.8 Liters) per day 4) you have a pretty weak heart--- complete abstinence from cocaine strongly advised 5) please take medications as prescribed 6) please take Lasix 20 mg daily (fluid medicine) for at least 2 days if you gain more than 3 pounds in 1 day or more than 5 pounds in a week 7) please follow-up with cardiologist as outpatient for recheck and reevaluation unless advised 8) Please note that there has been  several changes to your medications   Increase activity slowly   Complete by: As directed        Discharge Medications     Allergies as of 05/16/2021   No Known Allergies     Medication List    STOP taking these medications   aspirin 81 MG chewable tablet Replaced by: aspirin 81 MG EC tablet   isosorbide mononitrate 30 MG 24 hr tablet Commonly known as: IMDUR   warfarin 2 MG tablet Commonly known as: COUMADIN     TAKE these medications   acetaminophen 325 MG tablet Commonly known as: TYLENOL Take 2 tablets (650 mg total) by mouth every 6 (six) hours as needed for mild pain (or Fever >/= 101).   aspirin 81 MG EC tablet Take 1 tablet (81 mg total) by mouth daily with breakfast. Swallow whole. Replaces: aspirin 81 MG chewable tablet   atorvastatin 40 MG tablet Commonly known as: LIPITOR Take 1 tablet (40 mg total) by mouth daily.   cefdinir 300 MG capsule Commonly known as: OMNICEF Take 1 capsule (300 mg total) by mouth 2 (two) times daily for 5 days.   colchicine 0.6 MG tablet Take 1 tablet (0.6 mg total) by mouth 2 (two) times daily.   furosemide 20 MG tablet Commonly known as: Lasix Take 1 tablet (20 mg total) by mouth See admin instructions. Take Lasix 20 mg daily for at least 2 days if you gain more than 3 pounds in 1 day or  more than 5 pounds in a week   lisinopril 5 MG tablet Commonly known as: ZESTRIL Take 5 mg by mouth daily.   metFORMIN 500 MG tablet Commonly known as: GLUCOPHAGE Take 1 tablet (500 mg total) by mouth 2 (two) times daily with a meal. What changed: when to take this   metoprolol succinate 25 MG 24 hr tablet Commonly known as: TOPROL-XL Take 0.5 tablets (12.5 mg total) by mouth daily. Start taking on: May 17, 2021   pantoprazole 40 MG tablet Commonly known as: PROTONIX Take 1 tablet (40 mg total) by mouth daily.   PARoxetine 20 MG tablet Commonly known as: PAXIL Take 1 tablet (20 mg total) by mouth daily.   predniSONE 20 MG tablet Commonly known as: DELTASONE Take 2 tablets (40 mg total) by mouth daily with breakfast for 5 days. Start taking on: May 17, 2021       Major procedures and Radiology Reports - PLEASE review detailed and final reports for all details, in brief -   CT Angio Chest PE W and/or Wo Contrast  Result Date: 05/12/2021 CLINICAL DATA:  70 year old male with history of central chest pain and shortness of breath. EXAM: CT ANGIOGRAPHY CHEST WITH CONTRAST TECHNIQUE: Multidetector CT imaging of the chest was performed using the standard protocol during bolus administration of intravenous contrast. Multiplanar CT image reconstructions and MIPs were obtained to evaluate the vascular anatomy. CONTRAST:  159m OMNIPAQUE IOHEXOL 350 MG/ML SOLN COMPARISON:  No priors. FINDINGS: Cardiovascular: No filling defects within the pulmonary arterial to suggest pulmonary emboli. Heart size is normal. Extensive pericardial thickening and small volume of pericardial fluid. No pericardial calcification. There is aortic atherosclerosis, as well as atherosclerosis of the great vessels of the mediastinum and the coronary arteries, including calcified atherosclerotic plaque in the left main, left anterior descending, left circumflex and right coronary arteries. Proximal left anterior  descending coronary artery stent. Left-sided pacemaker device in place with lead tips terminating in the right atrium and right ventricular apex.  Mediastinum/Nodes: No pathologically enlarged mediastinal or hilar lymph nodes. Esophagus is unremarkable in appearance. No axillary lymphadenopathy. Lungs/Pleura: Study is limited by extensive patient respiratory motion. With these limitations in mind, there are no definite suspicious appearing pulmonary nodules or masses are noted. Patchy areas of ground-glass attenuation and septal thickening with thickening of the peribronchovascular interstitium and regional architectural distortion, most evident throughout the mid to upper lungs, concerning for potential interstitial lung disease such as hypersensitivity pneumonitis. No acute consolidative airspace disease. Trace right pleural effusion. No left pleural effusion. Upper Abdomen: Aortic atherosclerosis. Musculoskeletal: There are no aggressive appearing lytic or blastic lesions noted in the visualized portions of the skeleton. Review of the MIP images confirms the above findings. IMPRESSION: 1. No evidence of pulmonary embolism. 2. Extensive pericardial thickening and small volume of pericardial fluid. Clinical correlation for signs and symptoms of acute pericarditis is recommended. No pericardial calcification noted at this time. 3. Trace right pleural effusion. 4. The appearance of the lungs is suggestive of potential interstitial lung disease such as chronic hypersensitivity pneumonitis. Follow-up nonemergent high-resolution chest CT is recommended in 6 months to assess for temporal changes in the appearance of the lung parenchyma. Additionally, nonemergent outpatient referral to Pulmonology is suggested in the near future for further clinical evaluation. 5. Aortic atherosclerosis, in addition to left main and 3 vessel coronary artery disease. Please note that although the presence of coronary artery calcium  documents the presence of coronary artery disease, the severity of this disease and any potential stenosis cannot be assessed on this non-gated CT examination. Assessment for potential risk factor modification, dietary therapy or pharmacologic therapy may be warranted, if clinically indicated. Aortic Atherosclerosis (ICD10-I70.0). Electronically Signed   By: Vinnie Langton M.D.   On: 05/12/2021 15:39   DG CHEST PORT 1 VIEW  Result Date: 05/14/2021 CLINICAL DATA:  PICC line placement. EXAM: PORTABLE CHEST 1 VIEW COMPARISON:  Chest x-ray 05/12/2021. FINDINGS: PICC line noted with tip over SVC. Cardiac pacer noted with lead tips in right atrium right ventricle in stable position. Stable cardiomegaly. Mild bilateral interstitial prominence, improved from prior exam. Findings suggest improving interstitial edema and or pneumonitis. No pleural effusion or pneumothorax. Carotid vascular calcification cannot be excluded. IMPRESSION: 1.  PICC line noted with tip over SVC. 2.  Cardiac pacer stable position.  Stable cardiomegaly. 3. Mild bilateral interstitial prominence, improved from prior exam. Findings suggest improving interstitial edema and or pneumonitis. 4.  Carotid vascular disease cannot be excluded. Electronically Signed   By: Marcello Moores  Register   On: 05/14/2021 16:29   DG Chest Portable 1 View  Result Date: 05/12/2021 CLINICAL DATA:  70 year old male with history of chest pain. EXAM: PORTABLE CHEST 1 VIEW COMPARISON:  No priors. FINDINGS: Widespread areas of interstitial prominence and diffuse peribronchial cuffing. Lung volumes are normal. No consolidative airspace disease. No pleural effusions. No pneumothorax. No pulmonary nodule or mass noted. Pulmonary vasculature and the cardiomediastinal silhouette are within normal limits. Left-sided pacemaker/AICD in place with lead tips projecting over the expected location of the right atrium and right ventricle. IMPRESSION: 1. The appearance of the chest suggests  bronchitis, as above. Electronically Signed   By: Vinnie Langton M.D.   On: 05/12/2021 12:38   ECHOCARDIOGRAM COMPLETE  Result Date: 05/13/2021    ECHOCARDIOGRAM REPORT   Patient Name:   ANASTACIO BUA Date of Exam: 05/13/2021 Medical Rec #:  [id number]      Height:       71.0 in Accession #:  [id number]     Weight:       165.6 lb Date of Birth:  1951-01-12      BSA:          1.946 m Patient Age:    86 years       BP:           113/79 mmHg Patient Gender: M              HR:           67 bpm. Exam Location:  [facility] Procedure: 2D Echo, Cardiac Doppler and Color Doppler Indications:    Chest Pain, R07.9  History:        Patient has prior history of Echocardiogram examinations, most                 recent 03/27/2021. CHF; Risk Factors:Diabetes and Hypertension.                 Hx of LV (left ventricular) mural thrombus, AICD.  Sonographer:    Alvino Chapel RCS Referring Phys: [id number] [place]  1. Left ventricular ejection fraction, by estimation, is 20 to 25%. The left ventricle has severely decreased function. The left ventricle demonstrates global hypokinesis. The left ventricular internal cavity size was mildly dilated. Left ventricular diastolic parameters are indeterminate.  2. Right ventricular systolic function is normal. The right ventricular size is normal.  3. Left atrial size was mildly dilated.  4. A small pericardial effusion is present. The pericardial effusion is circumferential.  5. The mitral valve is normal in structure. No evidence of mitral valve regurgitation. No evidence of mitral stenosis.  6. The aortic valve has an indeterminant number of cusps. Aortic valve regurgitation is not visualized. No aortic stenosis is present.  7. The inferior vena cava is normal in size with greater than 50% respiratory variability, suggesting right atrial pressure of 3 mmHg. FINDINGS  Left Ventricle: Globabl hypokinesis, the apex is akinetic. There is no evidence of LV thrombus,  echocontrast was used. Left ventricular ejection fraction, by estimation, is 20 to 25%. The left ventricle has severely decreased function. The left ventricle demonstrates global hypokinesis. Definity contrast agent was given IV to delineate the left ventricular endocardial borders. The left ventricular internal cavity size was mildly dilated. There is no left ventricular hypertrophy. Left ventricular diastolic parameters are indeterminate. Right Ventricle: The right ventricular size is normal. No increase in right ventricular wall thickness. Right ventricular systolic function is normal. Left Atrium: Left atrial size was mildly dilated. Right Atrium: Right atrial size was normal in size. Pericardium: A small pericardial effusion is present. The pericardial effusion is circumferential. Mitral Valve: The mitral valve is normal in structure. No evidence of mitral valve regurgitation. No evidence of mitral valve stenosis. Tricuspid Valve: The tricuspid valve is normal in structure. Tricuspid valve regurgitation is not demonstrated. No evidence of tricuspid stenosis. Aortic Valve: The aortic valve has an indeterminant number of cusps. Aortic valve regurgitation is not visualized. No aortic stenosis is present. Aortic valve mean gradient measures 3.1 mmHg. Aortic valve peak gradient measures 5.4 mmHg. Aortic valve area, by VTI measures 3.79 cm. Pulmonic Valve: The pulmonic valve was not well visualized. Pulmonic valve regurgitation is not visualized. No evidence of pulmonic stenosis. Aorta: The aortic root is normal in size and structure. Pulmonary Artery: Indeterminate PASP, inadequate TR jet. Venous: The inferior vena cava is normal in size with greater than 50% respiratory variability, suggesting right atrial pressure of  3 mmHg. IAS/Shunts: No atrial level shunt detected by color flow Doppler.  LEFT VENTRICLE PLAX 2D LVIDd:         6.00 cm  Diastology LVIDs:         5.00 cm  LV e' medial:    7.51 cm/s LV PW:          0.90 cm  LV E/e' medial:  9.7 LV IVS:        0.90 cm  LV e' lateral:   8.05 cm/s LVOT diam:     2.50 cm  LV E/e' lateral: 9.1 LV SV:         80 LV SV Index:   41 LVOT Area:     4.91 cm  RIGHT VENTRICLE RV S prime:     8.92 cm/s TAPSE (M-mode): 1.2 cm LEFT ATRIUM             Index       RIGHT ATRIUM           Index LA diam:        3.10 cm 1.59 cm/m  RA Area:     15.30 cm LA Vol (A2C):   78.2 ml 40.19 ml/m RA Volume:   41.20 ml  21.17 ml/m LA Vol (A4C):   46.9 ml 24.10 ml/m LA Biplane Vol: 61.1 ml 31.40 ml/m  AORTIC VALVE AV Area (Vmax):    3.53 cm AV Area (Vmean):   3.08 cm AV Area (VTI):     3.79 cm AV Vmax:           116.30 cm/s AV Vmean:          83.240 cm/s AV VTI:            0.211 m AV Peak Grad:      5.4 mmHg AV Mean Grad:      3.1 mmHg LVOT Vmax:         83.60 cm/s LVOT Vmean:        52.200 cm/s LVOT VTI:          0.163 m LVOT/AV VTI ratio: 0.77  AORTA Ao Root diam: 3.80 cm MITRAL VALVE MV Area (PHT): 4.60 cm    SHUNTS MV Decel Time: 165 msec    Systemic VTI:  0.16 m MV E velocity: 73.00 cm/s  Systemic Diam: 2.50 cm MV A velocity: 77.50 cm/s MV E/A ratio:  0.94 Carlyle Dolly MD Electronically signed by Carlyle Dolly MD Signature Date/Time: 05/13/2021/2:35:26 PM    Final    [place] EKG SITE RITE  Result Date: 05/13/2021 If Site Rite image not attached, placement could not be confirmed due to current cardiac rhythm.   Micro Results   Recent Results (from the past 240 hour(s))  Culture, blood (routine x 2)     Status: None (Preliminary result)   Collection Time: 05/12/21 12:02 PM   Specimen: Right Antecubital; Blood  Result Value Ref Range Status   Specimen Description RIGHT ANTECUBITAL  Final   Special Requests   Final    BOTTLES DRAWN AEROBIC AND ANAEROBIC Blood Culture adequate volume   Culture   Final    NO GROWTH 4 DAYS Performed at [facility], [address], [place], [place] [address]    Report Status PENDING  Incomplete  Resp Panel by RT-PCR (Flu A&B, Covid) Nasopharyngeal  Swab     Status: None   Collection Time: 05/12/21 12:03 PM   Specimen: Nasopharyngeal Swab; Nasopharyngeal(NP) swabs in vial transport medium  Result Value Ref  Range Status   SARS Coronavirus 2 by RT PCR NEGATIVE NEGATIVE Final    Comment: (NOTE) SARS-CoV-2 target nucleic acids are NOT DETECTED.  The SARS-CoV-2 RNA is generally detectable in upper respiratory specimens during the acute phase of infection. The lowest concentration of SARS-CoV-2 viral copies this assay can detect is 138 copies/mL. A negative result does not preclude SARS-Cov-2 infection and should not be used as the sole basis for treatment or other patient management decisions. A negative result may occur with  improper specimen collection/handling, submission of specimen other than nasopharyngeal swab, presence of viral mutation(s) within the areas targeted by this assay, and inadequate number of viral copies(<138 copies/mL). A negative result must be combined with clinical observations, patient history, and epidemiological information. The expected result is Negative.  Fact Sheet for Patients:  EntrepreneurPulse.com.au  Fact Sheet for Healthcare Providers:  IncredibleEmployment.be  This test is no t yet approved or cleared by the [place] FDA and  has been authorized for detection and/or diagnosis of SARS-CoV-2 by FDA under an Emergency Use Authorization (EUA). This EUA will remain  in effect (meaning this test can be used) for the duration of the COVID-19 declaration under Section 564(b)(1) of the Act, 21 U.S.C.section 360bbb-3(b)(1), unless the authorization is terminated  or revoked sooner.       Influenza A by PCR NEGATIVE NEGATIVE Final   Influenza B by PCR NEGATIVE NEGATIVE Final    Comment: (NOTE) The Xpert Xpress SARS-CoV-2/FLU/RSV plus assay is intended as an aid in the diagnosis of influenza from Nasopharyngeal swab specimens and should not be used as a sole  basis for treatment. Nasal washings and aspirates are unacceptable for Xpert Xpress SARS-CoV-2/FLU/RSV testing.  Fact Sheet for Patients: EntrepreneurPulse.com.au  Fact Sheet for Healthcare Providers: IncredibleEmployment.be  This test is not yet approved or cleared by the [place] FDA and has been authorized for detection and/or diagnosis of SARS-CoV-2 by FDA under an Emergency Use Authorization (EUA). This EUA will remain in effect (meaning this test can be used) for the duration of the COVID-19 declaration under Section 564(b)(1) of the Act, 21 U.S.C. section 360bbb-3(b)(1), unless the authorization is terminated or revoked.  Performed at [facility], [address] [place], [place] [address]   Culture, blood (routine x 2)     Status: None (Preliminary result)   Collection Time: 05/12/21  1:13 PM   Specimen: Right Antecubital; Blood  Result Value Ref Range Status   Specimen Description   Final    RIGHT ANTECUBITAL BOTTLES DRAWN AEROBIC AND ANAEROBIC   Special Requests Blood Culture adequate volume  Final   Culture   Final    NO GROWTH 4 DAYS Performed at [facility], [address], [place], [place] [address]    Report Status PENDING  Incomplete  Culture, Urine     Status: None   Collection Time: 05/12/21  9:55 PM   Specimen: Urine, Clean Catch  Result Value Ref Range Status   Specimen Description   Final    URINE, CLEAN CATCH Performed at [facility], [address], [place], [place] [address]    Special Requests   Final    NONE Performed at [facility], [address] [place], [place] [address]    Culture   Final    NO GROWTH Performed at [facility], [place] [address] [place], [place] [address]    Report Status 05/14/2021 FINAL  Final  Gastrointestinal Panel by PCR , Stool     Status:  Abnormal   Collection Time: 05/13/21  2:17 AM   Specimen: Stool  Result Value Ref Range Status   Campylobacter species NOT  DETECTED NOT DETECTED Final   Plesimonas shigelloides NOT DETECTED NOT DETECTED Final   Salmonella species NOT DETECTED NOT DETECTED Final   Yersinia enterocolitica NOT DETECTED NOT DETECTED Final   Vibrio species NOT DETECTED NOT DETECTED Final   Vibrio cholerae NOT DETECTED NOT DETECTED Final   Enteroaggregative E coli (EAEC) DETECTED (A) NOT DETECTED Final    Comment: RESULT CALLED TO, READ BACK BY AND VERIFIED WITH: JESSICA HEARN _0  ON 05/14/21 SKL    Enteropathogenic E coli (EPEC) DETECTED (A) NOT DETECTED Final    Comment: RESULT CALLED TO, READ BACK BY AND VERIFIED WITH: JESSICA HEARN _1  ON 05/14/21 SKL    Enterotoxigenic E coli (ETEC) NOT DETECTED NOT DETECTED Final   Shiga like toxin producing E coli (STEC) NOT DETECTED NOT DETECTED Final   Shigella/Enteroinvasive E coli (EIEC) NOT DETECTED NOT DETECTED Final   Cryptosporidium NOT DETECTED NOT DETECTED Final   Cyclospora cayetanensis NOT DETECTED NOT DETECTED Final   Entamoeba histolytica NOT DETECTED NOT DETECTED Final   Giardia lamblia NOT DETECTED NOT DETECTED Final   Adenovirus F40/41 NOT DETECTED NOT DETECTED Final   Astrovirus NOT DETECTED NOT DETECTED Final   Norovirus GI/GII NOT DETECTED NOT DETECTED Final   Rotavirus A NOT DETECTED NOT DETECTED Final   Sapovirus (I, II, IV, and V) NOT DETECTED NOT DETECTED Final    Comment: Performed at [facility], [address] [place], [place] [address]  MRSA PCR Screening     Status: None   Collection Time: 05/13/21  9:30 AM   Specimen: Nasopharyngeal  Result Value Ref Range Status   MRSA by PCR NEGATIVE NEGATIVE Final    Comment:        The GeneXpert MRSA Assay (FDA approved for NASAL specimens only), is one component of a comprehensive MRSA colonization surveillance program. It is not intended to diagnose MRSA infection nor to guide or monitor treatment for MRSA infections. Performed at [facility], [address] [place], [place] [address]      Today   Subjective    Roger Mooney today has no new complaints- -eating a drinking well, voiding well, ambulating around  without significant shortness of breath or chest pains or hypoxia          Patient has been seen and examined prior to discharge   Objective   Blood pressure 106/62, pulse (!) 59, temperature 98.3 F (36.8 C), temperature source Oral, resp. rate 19, height _2  (1.803 m), weight 76.8 kg, SpO2 97 %.   Intake/Output Summary (Last 24 hours) at 05/16/2021 1048 Last data filed at 05/16/2021 1011 Gross per 24 hour  Intake 898.3 ml  Output 900 ml  Net -1.7 ml    Exam Gen:- Awake Alert, no acute distress, speaking in complete sentences HEENT:- [place].AT, No sclera icterus Neck-Supple Neck,No JVD,.  Lungs-  CTAB , good air movement bilaterally  CV- S1, S2 normal, regular Abd-  +ve B.Sounds, Abd Soft, No tenderness,    Extremity/Skin:- No  edema,   good pulses Psych-affect is appropriate, oriented x3 Neuro-no new focal deficits, no tremors    Data Review   CBC w Diff:  Lab Results  Component Value Date   WBC 16.0 (H) 05/16/2021   HGB 11.5 (L) 05/16/2021   HCT 37.1 (L) 05/16/2021   PLT 293 05/16/2021   LYMPHOPCT 14 05/14/2021  MONOPCT 10 05/14/2021   EOSPCT 1 05/14/2021   BASOPCT 1 05/14/2021    CMP:  Lab Results  Component Value Date   NA 136 05/16/2021   K 3.4 (L) 05/16/2021   CL 106 05/16/2021   CO2 22 05/16/2021   BUN 20 05/16/2021   CREATININE 1.07 05/16/2021   CREATININE 1.08 05/16/2021   PROT 7.5 05/12/2021   ALBUMIN 3.0 (L) 05/16/2021   BILITOT 3.1 (H) 05/12/2021   ALKPHOS 55 05/12/2021   AST 16 05/12/2021   ALT 13 05/12/2021  .   Total Discharge time is about 33 minutes  Roxan Hockey M.D on 05/16/2021 at 10:48 AM  Go to [email or web address] -  for contact info  [facility] - Office  [phone]

## 2021-05-16 NOTE — Plan of Care (Signed)
  Problem: Education: Goal: Knowledge of General Education information will improve Description: Including pain rating scale, medication(s)/side effects and non-pharmacologic comfort measures Outcome: Adequate for Discharge   Problem: Health Behavior/Discharge Planning: Goal: Ability to manage health-related needs will improve Outcome: Adequate for Discharge   Problem: Clinical Measurements: Goal: Ability to maintain clinical measurements within normal limits will improve Outcome: Adequate for Discharge Goal: Will remain free from infection Outcome: Adequate for Discharge Goal: Diagnostic test results will improve Outcome: Adequate for Discharge Goal: Respiratory complications will improve Outcome: Adequate for Discharge Goal: Cardiovascular complication will be avoided Outcome: Adequate for Discharge   Problem: Safety: Goal: Ability to remain free from injury will improve Outcome: Adequate for Discharge   Problem: Skin Integrity: Goal: Risk for impaired skin integrity will decrease Outcome: Adequate for Discharge   Problem: Pain Managment: Goal: General experience of comfort will improve Outcome: Adequate for Discharge   Problem: Elimination: Goal: Will not experience complications related to bowel motility Outcome: Adequate for Discharge Goal: Will not experience complications related to urinary retention Outcome: Adequate for Discharge

## 2021-05-17 LAB — CULTURE, BLOOD (ROUTINE X 2)
Culture: NO GROWTH
Culture: NO GROWTH
Special Requests: ADEQUATE
Special Requests: ADEQUATE

## 2021-05-23 ENCOUNTER — Ambulatory Visit: Payer: 59 | Admitting: Internal Medicine

## 2021-06-03 ENCOUNTER — Ambulatory Visit: Payer: 59 | Admitting: Internal Medicine

## 2021-06-11 NOTE — Progress Notes (Signed)
Cardiology Office Note    Date:  06/23/2021   ID:  Roger Mooney, DOB Apr 23, 1951, MRN [id number]   PCP:  Pcp, No   [place]  Cardiologist:  None   Advanced Practice Provider:  No care team member to display Electrophysiologist:  None   [id number]}   Chief Complaint  Patient presents with   Hospitalization Follow-up     History of Present Illness:  Roger Mooney is a 70 y.o. male  with history of CAD status postacute MI, ischemic cardiomyopathy LVEF 25%, CKD, hypertension, HLD, polysubstance abuse, chronic pain syndrome, anxiety depression, COPD.  Most of history taken from review of records from [facility] and [place].   Patient was admitted to [facility] 03/22/2021 through 03/27/2021 after being found outside his motel room apartment confused and unable to ambulate.  He had hypoxic respiratory failure and septic shock requiring intubation and Levophed.  Patient also had V. tach with ICD firing and was treated with amiodarone bolus and IV amiodarone.  Drug screen was positive for benzodiazepines, cocaine, opiates and THC.  Blood pressure was low and antihypertensives had to be held   Patient transferred  to [facility] 03/27/21 through 04/03/2021 [facility] with acute hypoxic respiratory failure.  Admitted to [facility] with severe E.coli sepsis with septic shock due to unintentional narcotic overdose echo with small circumferential pericardial effusion and treated with colchicine and steroids for possible pericarditis.  LVEF 20 to 25% with large apical thrombus new compared to echo 05/13/2021.  Blood pressures were soft and he was started on midodrine as well as Coreg and Lasix.  Because of compliance issues and severity of AKI he was not started on ACEI/Entresto.  History of LV thrombus noncompliant with Coumadin so now on Eliquis.  Patient comes in for f/u with his "daughter Roger Mooney" who has taken him in. He's living with her now. Having trouble with his diet.  Denies dyspnea or chest pain. BP low, no dizziness unless he stands up quickly. He says he is taking his time getting up. Just discharged for second admission Friday.      Past Medical History:  Diagnosis Date   Diabetes mellitus without complication ([place])    Hypertension     Past Surgical History:  Procedure Laterality Date   PACEMAKER IMPLANT      Current Medications: Current Meds  Medication Sig   acetaminophen (TYLENOL) 325 MG tablet Take 2 tablets (650 mg total) by mouth every 6 (six) hours as needed for mild pain (or Fever >/= 101).   apixaban (ELIQUIS) 5 MG TABS tablet Take 1 tablet (5 mg total) by mouth 2 (two) times daily.   atorvastatin (LIPITOR) 40 MG tablet Take 1 tablet (40 mg total) by mouth daily.   colchicine 0.6 MG tablet Take 1 tablet (0.6 mg total) by mouth 2 (two) times daily.   furosemide (LASIX) 20 MG tablet Take 1 tablet (20 mg total) by mouth See admin instructions. Take Lasix 20 mg daily for at least 2 days if you gain more than 3 pounds in 1 day or more than 5 pounds in a week   gabapentin (NEURONTIN) 400 MG capsule Take 400 mg by mouth 3 (three) times daily.   isosorbide mononitrate (IMDUR) 30 MG 24 hr tablet Take 0.5 tablets (15 mg total) by mouth daily.   metFORMIN (GLUCOPHAGE) 500 MG tablet Take 1 tablet (500 mg total) by mouth 2 (two) times daily with a meal.   metoprolol succinate (TOPROL-XL) 25 MG  24 hr tablet Take 0.5 tablets (12.5 mg total) by mouth daily.   midodrine (PROAMATINE) 5 MG tablet Take 1 tablet (5 mg total) by mouth 3 (three) times daily with meals.   pantoprazole (PROTONIX) 40 MG tablet Take 1 tablet (40 mg total) by mouth daily.   PARoxetine (PAXIL) 20 MG tablet Take 1 tablet (20 mg total) by mouth daily.   QUEtiapine (SEROQUEL) 25 MG tablet Take 25 mg by mouth at bedtime.   [DISCONTINUED] isosorbide mononitrate (IMDUR) 30 MG 24 hr tablet Take 30 mg by mouth daily.     Allergies:   Patient has no known allergies.   Social History    Socioeconomic History   Marital status: Legally Separated    Spouse name: Not on file   Number of children: Not on file   Years of education: Not on file   Highest education level: Not on file  Occupational History   Not on file  Tobacco Use   Smoking status: Every Day    Pack years: 0.00    Types: Cigarettes   Smokeless tobacco: Never  Substance and Sexual Activity   Alcohol use: Not Currently   Drug use: Yes    Types: Marijuana   Sexual activity: Not on file  Other Topics Concern   Not on file  Social History Narrative   Not on file   Social Determinants of Health   Financial Resource Strain: Not on file  Food Insecurity: Not on file  Transportation Needs: Not on file  Physical Activity: Not on file  Stress: Not on file  Social Connections: Not on file     Family History:  The patient's  family history is not on file.   ROS:   Please see the history of present illness.    ROS All other systems reviewed and are negative.   PHYSICAL EXAM:   VS:  BP (!) 86/54   Pulse 62   Ht 5' 11"  (1.803 m)   Wt 138 lb 0.2 oz (62.6 kg) Comment: 138 lb  SpO2 99%   BMI 19.25 kg/m   Physical Exam  GEN: Thin, in no acute distress   Neck: no JVD, carotid bruits, or masses Cardiac:RRR; no murmurs, rubs, or gallops  Respiratory: decreased breath sounds but  clear to auscultation bilaterally, normal work of breathing GI: soft, nontender, nondistended, + BS Ext: without cyanosis, clubbing, or edema, Good distal pulses bilaterally Neuro:  Alert and Oriented x 3 Psych: euthymic mood, full affect  Wt Readings from Last 3 Encounters:  06/23/21 138 lb 0.2 oz (62.6 kg)  06/20/21 160 lb 11.2 oz (72.9 kg)  05/16/21 169 lb 5 oz (76.8 kg)      Studies/Labs Reviewed:   EKG:  EKG is not ordered today.  T   Recent Labs: 06/14/2021: B Natriuretic Peptide 46.0 06/17/2021: ALT 14 06/18/2021: Hemoglobin 14.0; Platelets 302 06/19/2021: BUN 10; Creatinine, Ser 1.14; Potassium 4.2; Sodium  135 06/20/2021: Magnesium 1.9   Lipid Panel No results found for: CHOL, TRIG, HDL, CHOLHDL, VLDL, LDLCALC, LDLDIRECT  Additional studies/ records that were reviewed today include:  Echo 06/15/21 IMPRESSIONS     1. Technically difficult; akinesis of the anteroseptal wall, distal  inferiior wall and apex; large apical thrombus noted new compared to  05/13/21; results called to Kipp Brood MD.   2. Left ventricular ejection fraction, by estimation, is 20 to 25%. The  left ventricle has severely decreased function. The left ventricle  demonstrates regional wall motion abnormalities (see scoring  diagram/findings for description). Left ventricular  diastolic parameters are consistent with Grade I diastolic dysfunction  (impaired relaxation).   3. Right ventricular systolic function is normal. The right ventricular  size is normal.   4. The mitral valve is normal in structure. Trivial mitral valve  regurgitation. No evidence of mitral stenosis.   5. The aortic valve is tricuspid. Aortic valve regurgitation is not  visualized. No aortic stenosis is present.   6. Aortic dilatation noted. There is borderline dilatation of the aortic  root, measuring 38 mm.   7. The inferior vena cava is normal in size with greater than 50%  respiratory variability, suggesting right atrial pressure of 3 mmHg.  Echo 05/13/21 IMPRESSIONS     1. Left ventricular ejection fraction, by estimation, is 20 to 25%. The  left ventricle has severely decreased function. The left ventricle  demonstrates global hypokinesis. The left ventricular internal cavity size  was mildly dilated. Left ventricular  diastolic parameters are indeterminate.   2. Right ventricular systolic function is normal. The right ventricular  size is normal.   3. Left atrial size was mildly dilated.   4. A small pericardial effusion is present. The pericardial effusion is  circumferential.   5. The mitral valve is normal in structure. No  evidence of mitral valve  regurgitation. No evidence of mitral stenosis.   6. The aortic valve has an indeterminant number of cusps. Aortic valve  regurgitation is not visualized. No aortic stenosis is present.   7. The inferior vena cava is normal in size with greater than 50%  respiratory variability, suggesting right atrial pressure of 3 mmHg.    Risk Assessment/Calculations:         ASSESSMENT:    1. Chronic systolic CHF (congestive heart failure) (HCC)   2. 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 Coronary artery disease involving native coronary artery of native heart without angina pectoris   4. AICD (automatic cardioverter/defibrillator) present   5. LV (left ventricular) mural thrombus   6. Abnormal chest CT   7. Polysubstance abuse/Including Cocaine      PLAN:  In order of problems listed above:     Chronic systolic HF- echo 33% by recent echos at [facility] 05/13/21 and 06/15/21. Has AICD. Compensated today. 2 gm sodium diet.   Soft bp's limited CHF medical therapy.    Will not tolerate any additoinal meds.  Will lower Imdur 15 mg daily.Marland Kitchen    History of CAD prior LAD stent- from [facility] note a cath in 11/2020 was referenced (unknown location) with patent vessels    Chest pain- trops negative- CT with suggestion of pericardial thickening, elevated ESR and CRP. EKG not consistent wit pericardititis, no rub on exam. - echo LVEF 25%, small pericardial effusion no tamponade- was given a course of colchicine for possible pericardiits based on chest pain, CT findings, pericardial effusion elevated inflammatory markers . Would avoid NSAIDs given his LV dysfunction and CAD.- plan was for colchicine 0.26m bid x 3 months, 5 days prednisone 438mdaily. Currently not on and no complaints. Hold off on restarting.      ICD- normal device check in ER by [employer] rep, though suggested  evidence of fluid accumulation.[place] [facility] [place] living here now. Will arrange.   LV thrombus, [facility]far[facility]. Subtherapeutic INR on admission, long history of medication noncompliance - repeat echo  shows no evidence of thrombus, given resolution and poor coumadin compliance so it was stopped. Repeat echo 06/15/21 LV thrombus now on Eliquis-no bleeding problems.   Abnormal Lung CT- concern for possible ILD, needs high res CT as outpatient   Polysubstance abuse, currently smoking cigarettes but denies alcohol or drugs.       Shared Decision Making/Informed Consent        Medication Adjustments/Labs and Tests Ordered: Current medicines are reviewed at length with the patient today.  Concerns regarding medicines are outlined above.  Medication changes, Labs and Tests ordered today are listed in the Patient Instructions below. Patient Instructions  Medication Instructions:  Your physician has recommended you make the following change in your medication:   Decrease Imdur to 15 mg ( 1/2 Tablet) Daily   *If you need a refill on your cardiac medications before your next appointment, please call your pharmacy*   Lab Work: NONE   If you have labs (blood work) drawn today and your tests are completely normal, you will receive your results only by: [place] (if you have MyChart) OR A paper copy in the mail If you have any lab test that is abnormal or we need to change your treatment, we will call you to review the results.   Testing/Procedures: NONE    Follow-Up: At [facility], you and your health needs are our priority.  As part of our continuing mission to provide you with exceptional heart care, we have created designated Provider Care Teams.  These Care Teams include your primary Cardiologist (physician) and Advanced Practice Providers (APPs -  Physician Assistants and Nurse Practitioners) who all work together to provide you with  the care you need, when you need it.  We recommend signing up for the patient portal called "MyChart".  Sign up information is provided on this After Visit Summary.  MyChart is used to connect with patients for Virtual Visits (Telemedicine).  Patients are able to view lab/test results, encounter notes, upcoming appointments, etc.  Non-urgent messages can be sent to your provider as well.   To learn more about what you can do with MyChart, go to [email or web address].    Your next appointment:   3 month(s)  The format for your next appointment:   In Person  Provider:   Carlyle Dolly, MD   Other Instructions Follow up with Dr. Lovena Le for ICD.   Thank you for choosing [facility]!  Two Gram Sodium Diet 2000 mg  What is Sodium? Sodium is a mineral found naturally in many foods. The most significant source of sodium in the diet is table salt, which is about 40% sodium.  Processed, convenience, and preserved foods also contain a large amount of sodium.  The body needs only 500 mg of sodium daily to function,  A normal diet provides more than enough sodium even if you do not use salt.  Why Limit Sodium? A build up of sodium in the body can cause thirst, increased blood pressure, shortness of breath, and water retention.  Decreasing sodium in the diet can reduce edema and risk of heart attack or stroke associated with high blood pressure.  Keep in mind that there are many other factors involved in these health problems.  Heredity, obesity, lack of exercise, cigarette smoking, stress and what you eat all play a role.  General Guidelines: Do not add salt at the table or in cooking.  One teaspoon of salt contains over 2 grams of sodium. Read food labels  Avoid processed and convenience foods Ask your dietitian before eating any foods not dicussed in the menu planning guidelines Consult your physician if you wish to use a salt substitute or a sodium containing medication such as  antacids.  Limit milk and milk products to 16 oz (2 cups) per day.  Shopping Hints: READ LABELS!! "Dietetic" does not necessarily mean low sodium. Salt and other sodium ingredients are often added to foods during processing.    Menu Planning Guidelines Food Group Choose More Often Avoid  Beverages (see also the milk group All fruit juices, low-sodium, salt-free vegetables juices, low-sodium carbonated beverages Regular vegetable or tomato juices, commercially softened water used for drinking or cooking  Breads and Cereals Enriched white, wheat, rye and pumpernickel bread, hard rolls and dinner rolls; muffins, cornbread and waffles; most dry cereals, cooked cereal without added salt; unsalted crackers and breadsticks; low sodium or homemade bread crumbs Bread, rolls and crackers with salted tops; quick breads; instant hot cereals; pancakes; commercial bread stuffing; self-rising flower and biscuit mixes; regular bread crumbs or cracker crumbs  Desserts and Sweets Desserts and sweets mad with mild should be within allowance Instant pudding mixes and cake mixes  Fats Butter or margarine; vegetable oils; unsalted salad dressings, regular salad dressings limited to 1 Tbs; light, sour and heavy cream Regular salad dressings containing bacon fat, bacon bits, and salt pork; snack dips made with instant soup mixes or processed cheese; salted nuts  Fruits Most fresh, frozen and canned fruits Fruits processed with salt or sodium-containing ingredient (some dried fruits are processed with sodium sulfites        Vegetables Fresh, frozen vegetables and low- sodium canned vegetables Regular canned vegetables, sauerkraut, pickled vegetables, and others prepared in brine; frozen vegetables in sauces; vegetables seasoned with ham, bacon or salt pork  Condiments, Sauces, Miscellaneous  Salt substitute with physician's approval; pepper, herbs, spices; vinegar, lemon or lime juice; hot pepper sauce; garlic powder,  onion powder, low sodium soy sauce (1 Tbs.); low sodium condiments (ketchup, chili sauce, mustard) in limited amounts (1 tsp.) fresh ground horseradish; unsalted tortilla chips, pretzels, potato chips, popcorn, salsa (1/4 cup) Any seasoning made with salt including garlic salt, celery salt, onion salt, and seasoned salt; sea salt, rock salt, kosher salt; meat tenderizers; monosodium glutamate; mustard, regular soy sauce, barbecue, sauce, chili sauce, teriyaki sauce, steak sauce, Worcestershire sauce, and most flavored vinegars; canned gravy and mixes; regular condiments; salted snack foods, olives, picles, relish, horseradish sauce, catsup   Food preparation: Try these seasonings Meats:    Pork Sage, onion Serve with applesauce  Chicken Poultry seasoning, thyme, parsley Serve with cranberry sauce  Lamb Curry powder, rosemary, garlic, thyme Serve with mint sauce or jelly  Veal Marjoram, basil Serve with current jelly, cranberry sauce  Beef Pepper, bay leaf Serve with dry mustard, unsalted chive butter  Fish [place] leaf, dill Serve with unsalted lemon butter, unsalted parsley butter  Vegetables:    Asparagus Lemon juice   Broccoli Lemon juice   Carrots Mustard dressing parsley, mint, nutmeg, glazed with unsalted butter and sugar   Green beans Marjoram, lemon juice, nutmeg,dill seed   Tomatoes Basil, marjoram, onion   Spice /blend for [employer]" 4 tsp ground thyme 1 tsp ground sage 3 tsp ground rosemary 4 tsp ground marjoram   Test your knowledge A product that says "Salt Free" may still contain sodium. True or False Garlic Powder and Hot Pepper Sauce an be used as alternative seasonings.True or False Processed foods have  more sodium than fresh foods.  True or False Canned Vegetables have less sodium than froze True or False   WAYS TO DECREASE YOUR SODIUM INTAKE Avoid the use of added salt in cooking and at the table.  Table salt (and other prepared seasonings which contain salt) is probably  one of the greatest sources of sodium in the diet.  Unsalted foods can gain flavor from the sweet, sour, and butter taste sensations of herbs and spices.  Instead of using salt for seasoning, try the following seasonings with the foods listed.  Remember: how you use them to enhance natural food flavors is limited only by your creativity... Allspice-Meat, fish, eggs, fruit, peas, red and yellow vegetables Almond Extract-Fruit baked goods Anise Seed-Sweet breads, fruit, carrots, beets, cottage cheese, cookies (tastes like licorice) Basil-Meat, fish, eggs, vegetables, rice, vegetables salads, soups, sauces Bay Leaf-Meat, fish, stews, poultry Burnet-Salad, vegetables (cucumber-like flavor) Caraway Seed-Bread, cookies, cottage cheese, meat, vegetables, cheese, rice Cardamon-Baked goods, fruit, soups Celery Powder or seed-Salads, salad dressings, sauces, meatloaf, soup, bread.Do not use  celery salt Chervil-Meats, salads, fish, eggs, vegetables, cottage cheese (parsley-like flavor) Chili Power-Meatloaf, chicken cheese, corn, eggplant, egg dishes Chives-Salads cottage cheese, egg dishes, soups, vegetables, sauces Cilantro-Salsa, casseroles Cinnamon-Baked goods, fruit, pork, lamb, chicken, carrots Cloves-Fruit, baked goods, fish, pot roast, green beans, beets, carrots Coriander-Pastry, cookies, meat, salads, cheese (lemon-orange flavor) Cumin-Meatloaf, fish,cheese, eggs, cabbage,fruit pie (caraway flavor) [employer], fruit, eggs, fish, poultry, cottage cheese, vegetables Dill Seed-Meat, cottage cheese, poultry, vegetables, fish, salads, bread Fennel Seed-Bread, cookies, apples, pork, eggs, fish, beets, cabbage, cheese, Licorice-like flavor Garlic-(buds or powder) Salads, meat, poultry, fish, bread, butter, vegetables, potatoes.Do not  use garlic salt Ginger-Fruit, vegetables, baked goods, meat, fish, poultry Horseradish Root-Meet, vegetables, butter Lemon Juice or Extract-Vegetables, fruit,  tea, baked goods, fish salads Mace-Baked goods fruit, vegetables, fish, poultry (taste like nutmeg) Maple Extract-Syrups Marjoram-Meat, chicken, fish, vegetables, breads, green salads (taste like Sage) Mint-Tea, lamb, sherbet, vegetables, desserts, carrots, cabbage Mustard, Dry or Seed-Cheese, eggs, meats, vegetables, poultry Nutmeg-Baked goods, fruit, chicken, eggs, vegetables, desserts Onion Powder-Meat, fish, poultry, vegetables, cheese, eggs, bread, rice salads (Do not use   Onion salt) Orange Extract-Desserts, baked goods Oregano-Pasta, eggs, cheese, onions, pork, lamb, fish, chicken, vegetables, green salads Paprika-Meat, fish, poultry, eggs, cheese, vegetables Parsley Flakes-Butter, vegetables, meat fish, poultry, eggs, bread, salads (certain forms may   Contain sodium Pepper-Meat fish, poultry, vegetables, eggs Peppermint Extract-Desserts, baked goods Poppy Seed-Eggs, bread, cheese, fruit dressings, baked goods, noodles, vegetables, cottage  [employer], poultry, meat, fish, cauliflower, turnips,eggs bread Saffron-Rice, bread, veal, chicken, fish, eggs Sage-Meat, fish, poultry, onions, eggplant, tomateos, pork, stews Savory-Eggs, salads, poultry, meat, rice, vegetables, soups, pork Tarragon-Meat, poultry, fish, eggs, butter, vegetables (licorice-like flavor)  Thyme-Meat, poultry, fish, eggs, vegetables, (clover-like flavor), sauces, soups Tumeric-Salads, butter, eggs, fish, rice, vegetables (saffron-like flavor) Vanilla Extract-Baked goods, candy Vinegar-Salads, vegetables, meat marinades Walnut Extract-baked goods, candy   2. Choose your Foods Wisely   The following is a list of foods to avoid which are high in sodium:  Meats-Avoid all smoked, canned, salt cured, dried and kosher meat and fish as well as Anchovies   [employer] meats:Bologna, Liverwurst, Pastrami Canned meat or fish  Marinated  herring Caviar    Pepperoni Corned Beef   Pizza Dried chipped beef  Salami Frozen breaded fish or meat Salt pork Frankfurters or hot dogs  Sardines Gefilte fish   Sausage Ham (boiled ham, Proscuitto Smoked butt    spiced ham)  Spam      TV Dinners Vegetables Canned vegetables (Regular) Relish Canned mushrooms  Sauerkraut Olives    Tomato juice Pickles  Bakery and Dessert Products Canned puddings  Cream pies Cheesecake   Decorated cakes Cookies  Beverages/Juices Tomato juice, regular  Gatorade   V-8 vegetable juice, regular  Breads and Cereals Biscuit mixes   Salted potato chips, corn chips, pretzels Bread stuffing mixes  Salted crackers and rolls Pancake and waffle mixes Self-rising flour  Seasonings Accent    Meat sauces Barbecue sauce  Meat tenderizer Catsup    Monosodium glutamate (MSG) Celery salt   Onion salt Chili sauce   Prepared mustard Garlic salt   Salt, seasoned salt, sea salt Gravy mixes   Soy sauce Horseradish   Steak sauce Ketchup   Tartar sauce Lite salt    Teriyaki sauce Marinade mixes   Worcestershire sauce  Others Baking powder   Cocoa and cocoa mixes Baking soda   Commercial casserole mixes Candy-caramels, chocolate  Dehydrated soups    Bars, fudge,nougats  Instant rice and pasta mixes Canned broth or soup  Maraschino cherries Cheese, aged and processed cheese and cheese spreads  Learning Assessment Quiz  Indicated T (for True) or F (for False) for each of the following statements:  _____ Fresh fruits and vegetables and unprocessed grains are generally low in sodium _____ Water may contain a considerable amount of sodium, depending on the source _____ You can always tell if a food is high in sodium by tasting it _____ Certain laxatives my be high in sodium and should be avoided unless prescribed   by a physician or pharmacist _____ Salt substitutes may be used freely by anyone on a sodium restricted diet _____ Sodium is present in table  salt, food additives and as a natural component of   most foods _____ Table salt is approximately 90% sodium _____ Limiting sodium intake may help prevent excess fluid accumulation in the body _____ On a sodium-restricted diet, seasonings such as bouillon soy sauce, and    cooking wine should be used in place of table salt _____ On an ingredient list, a product which lists monosodium glutamate as the first   ingredient is an appropriate food to include on a low sodium diet  Circle the best answer(s) to the following statements (Hint: there may be more than one correct answer)  11. On a low-sodium diet, some acceptable snack items are:    A. Olives  F. Bean dip   K. Grapefruit juice    B. Salted Pretzels G. Commercial Popcorn   L. Canned peaches    C. Carrot Sticks  H. Bouillon   M. Unsalted nuts   D. [place] fries  I. Peanut butter crackers N. Salami   E. Sweet pickles J. Tomato Juice   O. Pizza  12.  Seasonings that may be used freely on a reduced - sodium diet include   A. Lemon wedges F.Monosodium glutamate K. Celery seed    B.Soysauce   G. Pepper   L. Mustard powder   C. Sea salt  H. Cooking wine  M. Onion flakes   D. Vinegar  E. Prepared horseradish N. Salsa   E. Sage   J. Worcestershire sauce  O. Chutney    Signed, Ermalinda Barrios, PA-C  06/23/2021 1:13 PM    [facility] [address], [place], [place]  [address] Phone: [phone]; Fax: [phone]

## 2021-06-14 ENCOUNTER — Emergency Department (HOSPITAL_COMMUNITY): Payer: 59

## 2021-06-14 ENCOUNTER — Inpatient Hospital Stay (HOSPITAL_COMMUNITY): Payer: 59

## 2021-06-14 ENCOUNTER — Other Ambulatory Visit: Payer: Self-pay

## 2021-06-14 ENCOUNTER — Inpatient Hospital Stay (HOSPITAL_COMMUNITY)
Admission: EM | Admit: 2021-06-14 | Discharge: 2021-06-20 | DRG: 917 | Disposition: A | Discharge status: Home Health | Payer: 59 | Attending: Internal Medicine | Admitting: Internal Medicine

## 2021-06-14 ENCOUNTER — Encounter (HOSPITAL_COMMUNITY): Payer: Self-pay | Admitting: *Deleted

## 2021-06-14 DIAGNOSIS — I5020 Unspecified systolic (congestive) heart failure: Secondary | ICD-10-CM | POA: Diagnosis not present

## 2021-06-14 DIAGNOSIS — R410 Disorientation, unspecified: Secondary | ICD-10-CM

## 2021-06-14 DIAGNOSIS — F1110 Opioid abuse, uncomplicated: Secondary | ICD-10-CM | POA: Diagnosis present

## 2021-06-14 DIAGNOSIS — N183 Chronic kidney disease, stage 3 unspecified: Secondary | ICD-10-CM | POA: Diagnosis present

## 2021-06-14 DIAGNOSIS — I5022 Chronic systolic (congestive) heart failure: Secondary | ICD-10-CM | POA: Diagnosis not present

## 2021-06-14 DIAGNOSIS — I2510 Atherosclerotic heart disease of native coronary artery without angina pectoris: Secondary | ICD-10-CM | POA: Diagnosis present

## 2021-06-14 DIAGNOSIS — Z978 Presence of other specified devices: Secondary | ICD-10-CM

## 2021-06-14 DIAGNOSIS — Z781 Physical restraint status: Secondary | ICD-10-CM

## 2021-06-14 DIAGNOSIS — E875 Hyperkalemia: Secondary | ICD-10-CM | POA: Diagnosis present

## 2021-06-14 DIAGNOSIS — N179 Acute kidney failure, unspecified: Secondary | ICD-10-CM | POA: Diagnosis present

## 2021-06-14 DIAGNOSIS — Z7984 Long term (current) use of oral hypoglycemic drugs: Secondary | ICD-10-CM

## 2021-06-14 DIAGNOSIS — Z955 Presence of coronary angioplasty implant and graft: Secondary | ICD-10-CM

## 2021-06-14 DIAGNOSIS — E1122 Type 2 diabetes mellitus with diabetic chronic kidney disease: Secondary | ICD-10-CM | POA: Diagnosis present

## 2021-06-14 DIAGNOSIS — E785 Hyperlipidemia, unspecified: Secondary | ICD-10-CM | POA: Diagnosis present

## 2021-06-14 DIAGNOSIS — I952 Hypotension due to drugs: Secondary | ICD-10-CM | POA: Diagnosis present

## 2021-06-14 DIAGNOSIS — Z20822 Contact with and (suspected) exposure to covid-19: Secondary | ICD-10-CM | POA: Diagnosis present

## 2021-06-14 DIAGNOSIS — F1910 Other psychoactive substance abuse, uncomplicated: Secondary | ICD-10-CM | POA: Diagnosis not present

## 2021-06-14 DIAGNOSIS — I513 Intracardiac thrombosis, not elsewhere classified: Secondary | ICD-10-CM | POA: Diagnosis present

## 2021-06-14 DIAGNOSIS — Z515 Encounter for palliative care: Secondary | ICD-10-CM | POA: Diagnosis not present

## 2021-06-14 DIAGNOSIS — Z7982 Long term (current) use of aspirin: Secondary | ICD-10-CM

## 2021-06-14 DIAGNOSIS — F17210 Nicotine dependence, cigarettes, uncomplicated: Secondary | ICD-10-CM | POA: Diagnosis present

## 2021-06-14 DIAGNOSIS — R578 Other shock: Secondary | ICD-10-CM | POA: Diagnosis present

## 2021-06-14 DIAGNOSIS — R579 Shock, unspecified: Secondary | ICD-10-CM | POA: Diagnosis present

## 2021-06-14 DIAGNOSIS — H9193 Unspecified hearing loss, bilateral: Secondary | ICD-10-CM | POA: Diagnosis present

## 2021-06-14 DIAGNOSIS — G928 Other toxic encephalopathy: Secondary | ICD-10-CM | POA: Diagnosis present

## 2021-06-14 DIAGNOSIS — I5042 Chronic combined systolic (congestive) and diastolic (congestive) heart failure: Secondary | ICD-10-CM | POA: Diagnosis present

## 2021-06-14 DIAGNOSIS — Z66 Do not resuscitate: Secondary | ICD-10-CM | POA: Diagnosis present

## 2021-06-14 DIAGNOSIS — Z7189 Other specified counseling: Secondary | ICD-10-CM | POA: Diagnosis not present

## 2021-06-14 DIAGNOSIS — E1169 Type 2 diabetes mellitus with other specified complication: Secondary | ICD-10-CM | POA: Diagnosis not present

## 2021-06-14 DIAGNOSIS — Z79899 Other long term (current) drug therapy: Secondary | ICD-10-CM

## 2021-06-14 DIAGNOSIS — I130 Hypertensive heart and chronic kidney disease with heart failure and stage 1 through stage 4 chronic kidney disease, or unspecified chronic kidney disease: Secondary | ICD-10-CM | POA: Diagnosis present

## 2021-06-14 DIAGNOSIS — T40604S Poisoning by unspecified narcotics, undetermined, sequela: Secondary | ICD-10-CM | POA: Diagnosis not present

## 2021-06-14 DIAGNOSIS — Z95810 Presence of automatic (implantable) cardiac defibrillator: Secondary | ICD-10-CM | POA: Diagnosis not present

## 2021-06-14 DIAGNOSIS — Z7901 Long term (current) use of anticoagulants: Secondary | ICD-10-CM

## 2021-06-14 DIAGNOSIS — M549 Dorsalgia, unspecified: Secondary | ICD-10-CM | POA: Diagnosis present

## 2021-06-14 DIAGNOSIS — I252 Old myocardial infarction: Secondary | ICD-10-CM

## 2021-06-14 DIAGNOSIS — T40601A Poisoning by unspecified narcotics, accidental (unintentional), initial encounter: Secondary | ICD-10-CM | POA: Diagnosis present

## 2021-06-14 LAB — CBC WITH DIFFERENTIAL/PLATELET
Abs Immature Granulocytes: 0.1 10*3/uL — ABNORMAL HIGH (ref 0.00–0.07)
Abs Immature Granulocytes: 0.08 10*3/uL — ABNORMAL HIGH (ref 0.00–0.07)
Basophils Absolute: 0.1 10*3/uL (ref 0.0–0.1)
Basophils Absolute: 0.1 10*3/uL (ref 0.0–0.1)
Basophils Relative: 0 %
Basophils Relative: 0 %
Eosinophils Absolute: 0 10*3/uL (ref 0.0–0.5)
Eosinophils Absolute: 0.2 10*3/uL (ref 0.0–0.5)
Eosinophils Relative: 0 %
Eosinophils Relative: 1 %
HCT: 41.3 % (ref 39.0–52.0)
HCT: 40.4 % (ref 39.0–52.0)
Hemoglobin: 12.6 g/dL — ABNORMAL LOW (ref 13.0–17.0)
Hemoglobin: 12.4 g/dL — ABNORMAL LOW (ref 13.0–17.0)
Immature Granulocytes: 1 %
Immature Granulocytes: 0 %
Lymphocytes Relative: 6 %
Lymphocytes Relative: 11 %
Lymphs Abs: 1 10*3/uL (ref 0.7–4.0)
Lymphs Abs: 2.1 10*3/uL (ref 0.7–4.0)
MCH: 27 pg (ref 26.0–34.0)
MCH: 27 pg (ref 26.0–34.0)
MCHC: 30.5 g/dL (ref 30.0–36.0)
MCHC: 30.7 g/dL (ref 30.0–36.0)
MCV: 88.4 fL (ref 80.0–100.0)
MCV: 88 fL (ref 80.0–100.0)
Monocytes Absolute: 0.7 10*3/uL (ref 0.1–1.0)
Monocytes Absolute: 1.1 10*3/uL — ABNORMAL HIGH (ref 0.1–1.0)
Monocytes Relative: 4 %
Monocytes Relative: 6 %
Neutro Abs: 15.3 10*3/uL — ABNORMAL HIGH (ref 1.7–7.7)
Neutro Abs: 15.3 10*3/uL — ABNORMAL HIGH (ref 1.7–7.7)
Neutrophils Relative %: 89 %
Neutrophils Relative %: 82 %
Platelets: 217 10*3/uL (ref 150–400)
Platelets: 237 10*3/uL (ref 150–400)
RBC: 4.67 MIL/uL (ref 4.22–5.81)
RBC: 4.59 MIL/uL (ref 4.22–5.81)
RDW: 18.1 % — ABNORMAL HIGH (ref 11.5–15.5)
RDW: 18 % — ABNORMAL HIGH (ref 11.5–15.5)
WBC: 17.2 10*3/uL — ABNORMAL HIGH (ref 4.0–10.5)
WBC: 18.8 10*3/uL — ABNORMAL HIGH (ref 4.0–10.5)
nRBC: 0 % (ref 0.0–0.2)
nRBC: 0 % (ref 0.0–0.2)

## 2021-06-14 LAB — COMPREHENSIVE METABOLIC PANEL
ALT: 15 U/L (ref 0–44)
ALT: 16 U/L (ref 0–44)
AST: 27 U/L (ref 15–41)
AST: 29 U/L (ref 15–41)
Albumin: 3.5 g/dL (ref 3.5–5.0)
Albumin: 3.1 g/dL — ABNORMAL LOW (ref 3.5–5.0)
Alkaline Phosphatase: 52 U/L (ref 38–126)
Alkaline Phosphatase: 46 U/L (ref 38–126)
Anion gap: 13 (ref 5–15)
Anion gap: 11 (ref 5–15)
BUN: 62 mg/dL — ABNORMAL HIGH (ref 8–23)
BUN: 56 mg/dL — ABNORMAL HIGH (ref 8–23)
CO2: 21 mmol/L — ABNORMAL LOW (ref 22–32)
CO2: 18 mmol/L — ABNORMAL LOW (ref 22–32)
Calcium: 7.9 mg/dL — ABNORMAL LOW (ref 8.9–10.3)
Calcium: 7.5 mg/dL — ABNORMAL LOW (ref 8.9–10.3)
Chloride: 104 mmol/L (ref 98–111)
Chloride: 109 mmol/L (ref 98–111)
Creatinine, Ser: 7.67 mg/dL — ABNORMAL HIGH (ref 0.61–1.24)
Creatinine, Ser: 6.32 mg/dL — ABNORMAL HIGH (ref 0.61–1.24)
GFR, Estimated: 7 mL/min — ABNORMAL LOW (ref >60)
GFR, Estimated: 9 mL/min — ABNORMAL LOW (ref >60)
Glucose, Bld: 91 mg/dL (ref 70–99)
Glucose, Bld: 112 mg/dL — ABNORMAL HIGH (ref 70–99)
Potassium: 5.6 mmol/L — ABNORMAL HIGH (ref 3.5–5.1)
Potassium: 5.1 mmol/L (ref 3.5–5.1)
Sodium: 138 mmol/L (ref 135–145)
Sodium: 138 mmol/L (ref 135–145)
Total Bilirubin: 1.7 mg/dL — ABNORMAL HIGH (ref 0.3–1.2)
Total Bilirubin: 1.7 mg/dL — ABNORMAL HIGH (ref 0.3–1.2)
Total Protein: 7 g/dL (ref 6.5–8.1)
Total Protein: 6.2 g/dL — ABNORMAL LOW (ref 6.5–8.1)

## 2021-06-14 LAB — URINALYSIS, ROUTINE W REFLEX MICROSCOPIC
Bilirubin Urine: NEGATIVE
Glucose, UA: NEGATIVE mg/dL
Hgb urine dipstick: NEGATIVE
Ketones, ur: 5 mg/dL — AB
Nitrite: NEGATIVE
Protein, ur: 30 mg/dL — AB
Specific Gravity, Urine: 1.026 (ref 1.005–1.030)
pH: 5 (ref 5.0–8.0)

## 2021-06-14 LAB — POCT I-STAT EG7
Acid-base deficit: 9 mmol/L — ABNORMAL HIGH (ref 0.0–2.0)
Bicarbonate: 19.8 mmol/L — ABNORMAL LOW (ref 20.0–28.0)
Calcium, Ion: 1.13 mmol/L — ABNORMAL LOW (ref 1.15–1.40)
HCT: 37 % — ABNORMAL LOW (ref 39.0–52.0)
Hemoglobin: 12.6 g/dL — ABNORMAL LOW (ref 13.0–17.0)
O2 Saturation: 72 %
Patient temperature: 98.2
Potassium: 5.1 mmol/L (ref 3.5–5.1)
Sodium: 142 mmol/L (ref 135–145)
TCO2: 21 mmol/L — ABNORMAL LOW (ref 22–32)
pCO2, Ven: 53.3 mmHg (ref 44.0–60.0)
pH, Ven: 7.177 — CL (ref 7.250–7.430)
pO2, Ven: 47 mmHg — ABNORMAL HIGH (ref 32.0–45.0)

## 2021-06-14 LAB — RESP PANEL BY RT-PCR (FLU A&B, COVID) ARPGX2
Influenza A by PCR: NEGATIVE
Influenza B by PCR: NEGATIVE
SARS Coronavirus 2 by RT PCR: NEGATIVE

## 2021-06-14 LAB — COOXEMETRY PANEL
Carboxyhemoglobin: 1.7 % — ABNORMAL HIGH (ref 0.5–1.5)
Methemoglobin: 0.8 % (ref 0.0–1.5)
O2 Saturation: 79.1 %
Total hemoglobin: 12.2 g/dL (ref 12.0–16.0)

## 2021-06-14 LAB — HEMOGLOBIN A1C
Hgb A1c MFr Bld: 6 % — ABNORMAL HIGH (ref 4.8–5.6)
Mean Plasma Glucose: 125.5 mg/dL

## 2021-06-14 LAB — PROTIME-INR
INR: 1.2 (ref 0.8–1.2)
Prothrombin Time: 15.2 seconds (ref 11.4–15.2)

## 2021-06-14 LAB — TROPONIN I (HIGH SENSITIVITY)
Troponin I (High Sensitivity): 17 ng/L (ref <18)
Troponin I (High Sensitivity): 17 ng/L (ref <18)

## 2021-06-14 LAB — RAPID URINE DRUG SCREEN, HOSP PERFORMED
Amphetamines: NOT DETECTED
Barbiturates: NOT DETECTED
Benzodiazepines: NOT DETECTED
Cocaine: NOT DETECTED
Opiates: POSITIVE — AB
Tetrahydrocannabinol: POSITIVE — AB

## 2021-06-14 LAB — PROCALCITONIN: Procalcitonin: 1.18 ng/mL

## 2021-06-14 LAB — PHOSPHORUS: Phosphorus: 6.9 mg/dL — ABNORMAL HIGH (ref 2.5–4.6)

## 2021-06-14 LAB — LACTIC ACID, PLASMA
Lactic Acid, Venous: 1.2 mmol/L (ref 0.5–1.9)
Lactic Acid, Venous: 1.1 mmol/L (ref 0.5–1.9)
Lactic Acid, Venous: 0.6 mmol/L (ref 0.5–1.9)

## 2021-06-14 LAB — AMMONIA: Ammonia: 16 umol/L (ref 9–35)

## 2021-06-14 LAB — MAGNESIUM: Magnesium: 1.5 mg/dL — ABNORMAL LOW (ref 1.7–2.4)

## 2021-06-14 LAB — APTT: aPTT: 29 seconds (ref 24–36)

## 2021-06-14 LAB — SALICYLATE LEVEL: Salicylate Lvl: <7 mg/dL — ABNORMAL LOW (ref 7.0–30.0)

## 2021-06-14 LAB — GLUCOSE, CAPILLARY: Glucose-Capillary: 105 mg/dL — ABNORMAL HIGH (ref 70–99)

## 2021-06-14 LAB — ACETAMINOPHEN LEVEL: Acetaminophen (Tylenol), Serum: <10 ug/mL — ABNORMAL LOW (ref 10–30)

## 2021-06-14 LAB — BRAIN NATRIURETIC PEPTIDE: B Natriuretic Peptide: 46 pg/mL (ref 0.0–100.0)

## 2021-06-14 LAB — ETHANOL: Alcohol, Ethyl (B): <10 mg/dL (ref <10)

## 2021-06-14 MED ORDER — ROCURONIUM BROMIDE 50 MG/5ML IV SOLN: 75.0000 mg | Freq: Once | INTRAVENOUS | Status: DC

## 2021-06-14 MED ORDER — ROCURONIUM BROMIDE 10 MG/ML (PF) SYRINGE
PREFILLED_SYRINGE | INTRAVENOUS | Status: AC
  Filled 2021-06-14: qty 10

## 2021-06-14 MED ORDER — DOCUSATE SODIUM 100 MG PO CAPS: 100.0000 mg | ORAL_CAPSULE | Freq: Two times a day (BID) | ORAL | Status: DC | PRN

## 2021-06-14 MED ORDER — POLYETHYLENE GLYCOL 3350 17 G PO PACK: 17.0000 g | PACK | Freq: Every day | ORAL | Status: DC | PRN

## 2021-06-14 MED ORDER — INSULIN ASPART 100 UNIT/ML IJ SOLN
0.0000 [IU] | INTRAMUSCULAR | Status: DC
  Administered 2021-06-17: 1 [IU] via SUBCUTANEOUS

## 2021-06-14 MED ORDER — FUROSEMIDE 10 MG/ML IJ SOLN: 40.0000 mg | Freq: Once | INTRAMUSCULAR | Status: DC

## 2021-06-14 MED ORDER — LORAZEPAM 2 MG/ML IJ SOLN: 2.0000 mg | Freq: Once | INTRAMUSCULAR | Status: DC

## 2021-06-14 MED ORDER — FENTANYL CITRATE (PF) 100 MCG/2ML IJ SOLN
INTRAMUSCULAR | Status: AC
  Filled 2021-06-14: qty 2

## 2021-06-14 MED ORDER — CALCIUM GLUCONATE 10 % IV SOLN: 1.0000 g | Freq: Once | INTRAVENOUS | Status: DC

## 2021-06-14 MED ORDER — SODIUM CHLORIDE 0.9 % IV BOLUS
1000.0000 mL | Freq: Once | INTRAVENOUS | Status: AC
  Administered 2021-06-14: 1000 mL via INTRAVENOUS

## 2021-06-14 MED ORDER — DOBUTAMINE IN D5W 4-5 MG/ML-% IV SOLN
2.5000 ug/kg/min | INTRAVENOUS | Status: DC
  Administered 2021-06-14: 2.5 ug/kg/min via INTRAVENOUS
  Filled 2021-06-14: qty 250

## 2021-06-14 MED ORDER — ETOMIDATE 2 MG/ML IV SOLN
INTRAVENOUS | Status: AC
  Filled 2021-06-14: qty 20

## 2021-06-14 MED ORDER — FENTANYL CITRATE (PF) 100 MCG/2ML IJ SOLN: 100.0000 ug | Freq: Once | INTRAMUSCULAR | Status: DC

## 2021-06-14 MED ORDER — NALOXONE HCL 0.4 MG/ML IJ SOLN
INTRAMUSCULAR | Status: AC
  Administered 2021-06-15: 0.4 mg via INTRAVENOUS
  Filled 2021-06-14: qty 2

## 2021-06-14 MED ORDER — CALCIUM GLUCONATE-NACL 1-0.675 GM/50ML-% IV SOLN
1.0000 g | Freq: Once | INTRAVENOUS | Status: AC
  Administered 2021-06-14: 1000 mg via INTRAVENOUS
  Filled 2021-06-14: qty 50

## 2021-06-14 MED ORDER — DEXTROSE 50 % IV SOLN
1.0000 | Freq: Once | INTRAVENOUS | Status: AC
  Administered 2021-06-14: 50 mL via INTRAVENOUS
  Filled 2021-06-14: qty 50

## 2021-06-14 MED ORDER — PIPERACILLIN-TAZOBACTAM 3.375 G IVPB 30 MIN
3.3750 g | Freq: Once | INTRAVENOUS | Status: AC
  Administered 2021-06-14: 3.375 g via INTRAVENOUS
  Filled 2021-06-14: qty 50

## 2021-06-14 MED ORDER — SODIUM CHLORIDE 0.9 % IV SOLN
2.0000 g | Freq: Every day | INTRAVENOUS | Status: DC
  Administered 2021-06-15: 2 g via INTRAVENOUS
  Filled 2021-06-14: qty 2

## 2021-06-14 MED ORDER — ORAL CARE MOUTH RINSE
15.0000 mL | Freq: Two times a day (BID) | OROMUCOSAL | Status: DC
  Administered 2021-06-15 – 2021-06-19 (×7): 15 mL via OROMUCOSAL

## 2021-06-14 MED ORDER — CHLORHEXIDINE GLUCONATE 0.12 % MT SOLN
15.0000 mL | Freq: Two times a day (BID) | OROMUCOSAL | Status: DC
  Administered 2021-06-15 – 2021-06-19 (×11): 15 mL via OROMUCOSAL
  Filled 2021-06-14 (×9): qty 15

## 2021-06-14 MED ORDER — CHLORHEXIDINE GLUCONATE CLOTH 2 % EX PADS
6.0000 | MEDICATED_PAD | Freq: Every day | CUTANEOUS | Status: DC
  Administered 2021-06-14 – 2021-06-18 (×5): 6 via TOPICAL

## 2021-06-14 MED ORDER — SODIUM CHLORIDE 0.9 % IV BOLUS: 1000.0000 mL | Freq: Once | INTRAVENOUS | Status: DC

## 2021-06-14 MED ORDER — ETOMIDATE 2 MG/ML IV SOLN: 25.0000 mg | Freq: Once | INTRAVENOUS | Status: DC

## 2021-06-14 MED ORDER — SODIUM CHLORIDE 0.9 % IV BOLUS
30.0000 mL/kg | Freq: Once | INTRAVENOUS | Status: AC
  Administered 2021-06-14: 2304 mL via INTRAVENOUS

## 2021-06-14 MED ORDER — VANCOMYCIN HCL 1500 MG/300ML IV SOLN
1500.0000 mg | Freq: Once | INTRAVENOUS | Status: AC
  Administered 2021-06-15: 1500 mg via INTRAVENOUS
  Filled 2021-06-14: qty 300

## 2021-06-14 MED ORDER — FENTANYL 2500MCG IN NS 250ML (10MCG/ML) PREMIX INFUSION: 0.0000 ug/h | INTRAVENOUS | Status: DC

## 2021-06-14 MED ORDER — FUROSEMIDE 10 MG/ML IJ SOLN
120.0000 mg | Freq: Once | INTRAVENOUS | Status: AC
  Administered 2021-06-14: 120 mg via INTRAVENOUS
  Filled 2021-06-14: qty 12

## 2021-06-14 MED ORDER — NOREPINEPHRINE 4 MG/250ML-% IV SOLN
0.0000 ug/min | INTRAVENOUS | Status: DC
  Administered 2021-06-14: 5 ug/min via INTRAVENOUS
  Administered 2021-06-15: 11 ug/min via INTRAVENOUS
  Filled 2021-06-14 (×3): qty 250

## 2021-06-14 MED ORDER — SODIUM CHLORIDE 0.9% FLUSH
10.0000 mL | Freq: Two times a day (BID) | INTRAVENOUS | Status: DC
Start: 2021-06-14 — End: 2021-06-20
  Administered 2021-06-15 – 2021-06-19 (×8): 10 mL

## 2021-06-14 MED ORDER — INSULIN ASPART 100 UNIT/ML IV SOLN
5.0000 [IU] | Freq: Once | INTRAVENOUS | Status: AC
  Administered 2021-06-14: 5 [IU] via INTRAVENOUS

## 2021-06-14 MED ORDER — NALOXONE HCL 2 MG/2ML IJ SOSY
2.0000 mg | PREFILLED_SYRINGE | INTRAMUSCULAR | Status: DC | PRN
  Filled 2021-06-14: qty 2

## 2021-06-14 MED ORDER — LORAZEPAM 2 MG/ML IJ SOLN
2.0000 mg | Freq: Once | INTRAMUSCULAR | Status: AC
  Administered 2021-06-14: 2 mg via INTRAVENOUS
  Filled 2021-06-14: qty 1

## 2021-06-14 MED ORDER — VANCOMYCIN VARIABLE DOSE PER UNSTABLE RENAL FUNCTION (PHARMACIST DOSING): Status: DC

## 2021-06-14 MED ORDER — SODIUM CHLORIDE 0.9% FLUSH: 10.0000 mL | INTRAVENOUS | Status: DC | PRN

## 2021-06-14 NOTE — Progress Notes (Signed)
eLink Physician-Brief Progress Note Patient Name: Roger Mooney DOB: January 20, 1951 MRN: [id number]   Date of Service  06/14/2021  HPI/Events of Note  Agitation - Patient arrives with bilateral soft wrist restraints. No order for restraints.   eICU Interventions  Will order bilateral soft wrist restraints X 11 hours.     Intervention Category Major Interventions: Delirium, psychosis, severe agitation - evaluation and management  Nichollas Perusse Eugene 06/14/2021, 10:45 PM

## 2021-06-14 NOTE — Progress Notes (Signed)
Pharmacy Antibiotic Note  Roger Mooney is a 70 y.o. male admitted on 06/14/2021 with AMS, AKI and sepsis.  Pharmacy has been consulted for Vancomycin and Cefepime  dosing.  Plan: Vancomycin 1500 mg IV now Cefepime 2 g IV q24h F/U renal function and redose/adjust dosage as indicated  Height: 5\' 11"  (180.3 cm) Weight: 76.8 kg (169 lb 5 oz) IBW/kg (Calculated) : 75.3  Temp (24hrs), Avg:97.8 F (36.6 C), Min:97.8 F (36.6 C), Max:97.8 F (36.6 C)  Recent Labs  Lab 06/14/21 1550 06/14/21 1732  WBC 17.2*  --   CREATININE 7.67*  --   LATICACIDVEN 1.2 1.1    Estimated Creatinine Clearance: 9.7 mL/min (A) (by C-G formula based on SCr of 7.67 mg/dL (H)).    No Known Allergies    08/15/21 06/14/2021 10:53 PM

## 2021-06-14 NOTE — ED Provider Notes (Signed)
[facility] EMERGENCY DEPARTMENT Provider Note   CSN: [id number] Arrival date & time: 06/14/21  1432     History Chief Complaint  Patient presents with   Altered Mental Status    Roger Mooney is a 70 y.o. male.  Patient brought to the ER for confusion and altered mental status.  Reportedly his roommate stated that he was combative and seeing things.  When EMS arrived patient appeared confused unable to give additional history.  He may reports that the patient may have fallen yesterday.  He has a history of polysubstance abuse and bacteremia.  No additional review of systems or history can be obtained from the patient.  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 coli sepsis with septic shock ---  05/16/2021   CAD-status post prior LAD stent--- last LHC 12/02/2020, with patent vessels apparently 05/16/2021   Hypotension 05/12/2021   HTN (hypertension) 05/12/2021   DM (diabetes mellitus) (HCC) 05/12/2021   Chronic systolic CHF (congestive heart failure) (HCC) 05/12/2021   LV (left ventricular) mural thrombus 05/12/2021   AKI (acute kidney injury) (HCC) 05/12/2021    Past Surgical History:  Procedure Laterality Date   PACEMAKER IMPLANT         No family history on file.  Social History   Tobacco Use   Smoking status: Every Day    Pack years: 0.00    Types: Cigarettes   Smokeless tobacco: Never  Substance Use Topics   Alcohol use: Not Currently   Drug use: Yes    Types: Marijuana    Home  Medications Prior to Admission medications   Medication Sig Start Date End Date Taking? Authorizing Provider  acetaminophen (TYLENOL) 325 MG tablet Take 2 tablets (650 mg total) by mouth every 6 (six) hours as needed for mild pain (or Fever >/= 101). 05/16/21   Shon Hale, MD  aspirin EC 81 MG EC tablet Take 1 tablet (81 mg total) by mouth daily with breakfast. Swallow whole. 05/16/21   Shon Hale, MD  atorvastatin (LIPITOR) 40 MG tablet Take 1 tablet (40 mg total) by mouth daily. 05/16/21   Shon Hale, MD  colchicine 0.6 MG tablet Take 1 tablet (0.6 mg total) by mouth 2 (two) times daily. 05/16/21   Shon Hale, MD  furosemide (LASIX) 20 MG tablet Take 1 tablet (20 mg total) by mouth See admin instructions. Take Lasix 20 mg daily for at least 2 days if you gain more than 3 pounds in 1 day or more than 5 pounds in a week 05/16/21 05/16/22  Shon Hale, MD  lisinopril (ZESTRIL) 5 MG tablet Take 5 mg by mouth daily. 03/19/21   [provider]  metFORMIN (GLUCOPHAGE) 500 MG tablet Take 1 tablet (500 mg total) by mouth 2 (two) times daily with a meal. 05/16/21   Emokpae, Courage, MD  metoprolol succinate (TOPROL-XL) 25 MG 24 hr tablet Take 0.5 tablets (12.5 mg total) by mouth daily. 05/17/21   Shon Hale, MD  pantoprazole (PROTONIX) 40 MG tablet Take 1 tablet (40 mg total)  by mouth daily. 05/16/21   Shon Hale, MD  PARoxetine (PAXIL) 20 MG tablet Take 1 tablet (20 mg total) by mouth daily. 05/16/21   Shon Hale, MD    Allergies    Patient has no known allergies.  Review of Systems   Review of Systems  Unable to perform ROS: Mental status change   Physical Exam Updated Vital Signs BP 110/71   Pulse 87   Temp 97.8 F (36.6 C)   Resp 17   Ht 5\' 11"  (1.803 m)   Wt 76.8 kg   SpO2 100%   BMI 23.61 kg/m   Physical Exam Constitutional:      General: He is not in acute distress.    Appearance: He is well-developed.     Comments: Awake alert, appears  confused.  HENT:     Head: Normocephalic.     Nose: Nose normal.  Eyes:     Extraocular Movements: Extraocular movements intact.  Cardiovascular:     Rate and Rhythm: Normal rate.  Pulmonary:     Effort: Pulmonary effort is normal.  Skin:    Coloration: Skin is not jaundiced.  Neurological:     Mental Status: Mental status is at baseline.     Comments: Patient appears to be moving all extremities.  No facial droop noted no slurred speech noted.  He is not cooperative with a complete neuro exam.  Speech pattern appears within normal limits, however the patient following commands only intermittently.    ED Results / Procedures / Treatments   Labs (all labs ordered are listed, but only abnormal results are displayed) Labs Reviewed  CBC WITH DIFFERENTIAL/PLATELET - Abnormal; Notable for the following components:      Result Value   WBC 17.2 (*)    Hemoglobin 12.6 (*)    RDW 18.1 (*)    Neutro Abs 15.3 (*)    Abs Immature Granulocytes 0.10 (*)    All other components within normal limits  COMPREHENSIVE METABOLIC PANEL - Abnormal; Notable for the following components:   Potassium 5.6 (*)    CO2 21 (*)    BUN 62 (*)    Creatinine, Ser 7.67 (*)    Calcium 7.9 (*)    Total Bilirubin 1.7 (*)    GFR, Estimated 7 (*)    All other components within normal limits  URINALYSIS, ROUTINE W REFLEX MICROSCOPIC - Abnormal; Notable for the following components:   Color, Urine AMBER (*)    APPearance HAZY (*)    Ketones, ur 5 (*)    Protein, ur 30 (*)    Leukocytes,Ua MODERATE (*)    Bacteria, UA RARE (*)    All other components within normal limits  RAPID URINE DRUG SCREEN, HOSP PERFORMED - Abnormal; Notable for the following components:   Opiates POSITIVE (*)    Tetrahydrocannabinol POSITIVE (*)    All other components within normal limits  CULTURE, BLOOD (ROUTINE X 2)  CULTURE, BLOOD (ROUTINE X 2)  RESP PANEL BY RT-PCR (FLU A&B, COVID) ARPGX2  ETHANOL  BRAIN NATRIURETIC PEPTIDE   AMMONIA  LACTIC ACID, PLASMA  LACTIC ACID, PLASMA  ACETAMINOPHEN LEVEL  SALICYLATE LEVEL  TROPONIN I (HIGH SENSITIVITY)  TROPONIN I (HIGH SENSITIVITY)    EKG EKG Interpretation  Date/Time:  Saturday June 14 2021 14:41:46 EDT Ventricular Rate:  79 PR Interval:  137 QRS Duration: 103 QT Interval:  375 QTC Calculation: 430 R Axis:   -14 Text Interpretation: Sinus rhythm LAE, consider biatrial enlargement Anteroseptal infarct, age  indeterminate Lateral leads are also involved Confirmed by Norman Clay ([id number]) on 06/14/2021 5:28:25 PM  Radiology DG Chest 1 View  Result Date: 06/14/2021 CLINICAL DATA:  Chest pain and confusion EXAM: CHEST  1 VIEW COMPARISON:  05/14/2021 FINDINGS: Stable appearance of the AICD. Low lung volumes are present, causing crowding of the pulmonary vasculature. Upper normal size of the cardiac shadow. Suspected mild lingular scarring, unchanged. Subtle accentuation of the interstitium, unchanged. No overt edema. No blunting of the costophrenic angles. IMPRESSION: 1. Persistent low lung volumes, with some stable faint opacity at the left lung base likely representing scarring in the lingula. Persistent fine interstitial accentuation, without overt edema. Electronically Signed   By: Gaylyn Rong M.D.   On: 06/14/2021 17:43   CT Head Wo Contrast  Result Date: 06/14/2021 CLINICAL DATA:  Mental status change.  Combative and hallucinating. EXAM: CT HEAD WITHOUT CONTRAST TECHNIQUE: Contiguous axial images were obtained from the base of the skull through the vertex without intravenous contrast. COMPARISON:  None. FINDINGS: Brain: No evidence of acute infarction, hemorrhage, hydrocephalus, extra-axial collection or mass lesion/mass effect. Vascular: No hyperdense vessel or unexpected calcification. Skull: Normal. Negative for fracture or focal lesion. Sinuses/Orbits: No acute finding. Other: None. IMPRESSION: No acute intracranial abnormalities. Normal brain. Electronically  Signed   By: Signa Kell M.D.   On: 06/14/2021 17:19    Procedures .Central Line  Date/Time: 06/14/2021 8:24 PM Performed by: Cheryll Cockayne, MD Authorized by: Cheryll Cockayne, MD   Comments:     Sided central line placed by myself with maximal sterile barrier technique.  Ultrasound technique with Seldinger technique applied.  Patient tolerated procedure well.  Good flow of nonpulsatile dark blood from all 3 ports.  Sutured into the skin and sterile dressing placed. .Critical Care  Date/Time: 06/14/2021 8:24 PM Performed by: Cheryll Cockayne, MD Authorized by: Cheryll Cockayne, MD   Critical care provider statement:    Critical care time (minutes):  50   Critical care time was exclusive of:  Separately billable procedures and treating other patients   Critical care was necessary to treat or prevent imminent or life-threatening deterioration of the following conditions:  Renal failure, CNS failure or compromise and shock   Medications Ordered in ED Medications  norepinephrine (LEVOPHED) 4mg  in premix infusion (10 mcg/min Intravenous Rate/Dose Change 06/14/21 1926)  sodium chloride 0.9 % bolus 2,304 mL (0 mL/kg  76.8 kg Intravenous Stopped 06/14/21 1712)  piperacillin-tazobactam (ZOSYN) IVPB 3.375 g (0 g Intravenous Stopped 06/14/21 1700)  LORazepam (ATIVAN) injection 2 mg (2 mg Intravenous Given 06/14/21 1613)  sodium chloride 0.9 % bolus 1,000 mL (0 mLs Intravenous Stopped 06/14/21 1849)  sodium chloride 0.9 % bolus 1,000 mL (1,000 mLs Intravenous New Bag/Given 06/14/21 1849)    ED Course  I have reviewed the triage vital signs and the nursing notes.  Pertinent labs & imaging results that were available during my care of the patient were reviewed by me and considered in my medical decision making (see chart for details).    MDM Rules/Calculators/A&P                          Blood pressure recorded as low.  Concern for possible septic etiology.  Patient given 30 cc/kg fluid resuscitation  and sepsis labs initiated.  Final Clinical Impression(s) / ED Diagnoses Final diagnoses:  Confusion    Rx / DC Orders ED Discharge Orders     None  Cheryll Cockayne, MD 06/14/21 2024

## 2021-06-14 NOTE — H&P (Addendum)
NAME:  Roger Mooney, MRN:  [id number], DOB:  07-23-1951, LOS: 0 ADMISSION DATE:  06/14/2021, CONSULTATION DATE:  06/14/21 REFERRING MD: Dr. Audley Hose , CHIEF COMPLAINT:  Shock   History of Present Illness:  This is a 70 yo with history of DM, polysubstance abuse,CAD (history of MI sp angioplasty and LAD stent), CKD3, HFrEF (EF 25%)and HTN who presents for confusion and AMS. Patients roommate noted that patient was combative and hallucinating and thus called EMS.  EMS arrived and noted patient was confused and unable to provide history. ED unable to get history from patient as he was altered and providing inappropriate answers to questions.   In ED wokrup remarkable for negative COVID testing, Leukocytosis of 17.2, Creatinine of 7.67 with potassium of 5.6. AST and ALT normal, etoh normal, normal troponin, BNP of 56, normal ammonia of 16, LA of 1.2, UA with moderate LE and rare bacteria. UA positive for THC and opiates.  CXR noting-1. Persistent low lung volumes, with some stable faint opacity at the left lung base likely representing scarring in the lingula. Persistent fine interstitial accentuation, without overt edema.  On admission patient had arterial line placed. Became progressively somnolent with concern that he would need intubation. Narcan given just before and patient improved immensely.      Pertinent  Medical History  Polysubstance abuse-Previous UA notabve for cocaine, THC, Opiates, and benzos,  CAD (history of MI sp angioplasty and LAD stent),  CKD3,  HFrEF (EF 25%) HTN     Significant Hospital Events: Including procedures, antibiotic start and stop dates in addition to other pertinent events   Admitted to ICU on 06/14/21  Interim History / Subjective:    Objective   Blood pressure 117/61, pulse 93, temperature 97.8 F (36.6 C), resp. rate 18, height 5\' 11"  (1.803 m), weight 76.8 kg, SpO2 100 %.        Intake/Output Summary (Last 24 hours) at 06/14/2021 2101 Last data  filed at 06/14/2021 2040 Gross per 24 hour  Intake 4404 ml  Output --  Net 4404 ml   Filed Weights   06/14/21 1440  Weight: 76.8 kg    Examination: General: Patient ininitally obtunded. Began to respond after narcan HENT: Moist mucous membranes Lungs: CTAB Cardiovascular: RRR-No murmurs noted Abdomen: Soft non tender and non distended Extremities: No deformites appreciated Neuro: Shaking but wakes up and says name. Able to follow simple commands. Moves all extremities GU: Foly in place. Rectal exam with no blood in rectum  Resolved Hospital Problem list   NA  Assessment & Plan:  This is a 71 yo male with history of polysubstance abuse, HFrEF sp pacer/ICD, CAD, and DM who presents encephalopathic found to be in shock.   Hypotnesion 2/2 overdose on opiates-Patient persistently hypotensive with evidence of end organ damage as noted by encephaolaptyh-Etiology unclear. Intitall thought cardiogenic vs sepsis seem like most likely culprits although normal BNP and no fever bode against both these. [name] woke up immediately after narcan and blood pressure improved immensely.  -Broad  spectrum abx -Co-ox off central line normal -Blood and urine cultures -TTE in AM -Consider narcan infusion if patient becomes somnolent again -Consider addition of dobutamine pending co-ox. Co stable improved so dobutamine dc'd  Polysubstance abuse-Patient responded very well to narcan. BP improved and mental status -Unable to get a hold of partner for collateral information -Positve for opiates on UA -Reponses to narcan -Low threshold to start narcan infusion  -CIWA -Low threshold to start precedex infusion  AKI with  hyperkalemia- -No peaked T waves on EKG -Repeat EKG stat -Repeat BMP stat -Insulin dextrros with calcium empirically.  -Insert Foley for accurate I/O   HFrEF-Hold home medications of metoprolol, furosemide, and lisinopril -Formal TTE in AM -Consider Diuresis and dobutamine  pending workup  DM-2-On metformin at home -hold metformin -SSI -A1c      Best Practice (right click and "Reselect all SmartList Selections" daily)   Diet/type: NPO DVT prophylaxis: prophylactic heparin  GI prophylaxis: N/A Lines: Central line Foley:  Yes, and it is still needed Code Status:  full code Last date of multidisciplinary goals of care discussion [Attempted to call partner]  Labs   CBC: Recent Labs  Lab 06/14/21 1550  WBC 17.2*  NEUTROABS 15.3*  HGB 12.6*  HCT 41.3  MCV 88.4  PLT 217    Basic Metabolic Panel: Recent Labs  Lab 06/14/21 1550  NA 138  K 5.6*  CL 104  CO2 21*  GLUCOSE 91  BUN 62*  CREATININE 7.67*  CALCIUM 7.9*   GFR: Estimated Creatinine Clearance: 9.7 mL/min (A) (by C-G formula based on SCr of 7.67 mg/dL (H)). Recent Labs  Lab 06/14/21 1550 06/14/21 1732  WBC 17.2*  --   LATICACIDVEN 1.2 1.1    Liver Function Tests: Recent Labs  Lab 06/14/21 1550  AST 27  ALT 15  ALKPHOS 52  BILITOT 1.7*  PROT 7.0  ALBUMIN 3.5   No results for input(s): LIPASE, AMYLASE in the last 168 hours. Recent Labs  Lab 06/14/21 1550  AMMONIA 16    ABG    Component Value Date/Time   O2SAT 66.7 05/15/2021 0430     Coagulation Profile: No results for input(s): INR, PROTIME in the last 168 hours.  Cardiac Enzymes: No results for input(s): CKTOTAL, CKMB, CKMBINDEX, TROPONINI in the last 168 hours.  HbA1C: No results found for: HGBA1C  CBG: No results for input(s): GLUCAP in the last 168 hours.  Review of Systems:   Pertinent positives and negatives per HPI. Unable to attain further given patients mentation.   Past Medical History:  He,  has a past medical history of Diabetes mellitus without complication (HCC) and Hypertension.   Surgical History:   Past Surgical History:  Procedure Laterality Date   PACEMAKER IMPLANT       Social History:   reports that he has been smoking cigarettes. He has never used smokeless  tobacco. He reports previous alcohol use. He reports current drug use. Drug: Marijuana.   Family History:  His family history is not on file.   Allergies No Known Allergies   Home Medications  Prior to Admission medications   Medication Sig Start Date End Date Taking? Authorizing Provider  acetaminophen (TYLENOL) 325 MG tablet Take 2 tablets (650 mg total) by mouth every 6 (six) hours as needed for mild pain (or Fever >/= 101). 05/16/21   Shon Hale, MD  aspirin EC 81 MG EC tablet Take 1 tablet (81 mg total) by mouth daily with breakfast. Swallow whole. 05/16/21   Shon Hale, MD  atorvastatin (LIPITOR) 40 MG tablet Take 1 tablet (40 mg total) by mouth daily. 05/16/21   Shon Hale, MD  colchicine 0.6 MG tablet Take 1 tablet (0.6 mg total) by mouth 2 (two) times daily. 05/16/21   Shon Hale, MD  furosemide (LASIX) 20 MG tablet Take 1 tablet (20 mg total) by mouth See admin instructions. Take Lasix 20 mg daily for at least 2 days if you gain more than 3 pounds in  1 day or more than 5 pounds in a week 05/16/21 05/16/22  Shon Hale, MD  lisinopril (ZESTRIL) 5 MG tablet Take 5 mg by mouth daily. 03/19/21   [provider]  metFORMIN (GLUCOPHAGE) 500 MG tablet Take 1 tablet (500 mg total) by mouth 2 (two) times daily with a meal. 05/16/21   Emokpae, Courage, MD  metoprolol succinate (TOPROL-XL) 25 MG 24 hr tablet Take 0.5 tablets (12.5 mg total) by mouth daily. 05/17/21   Shon Hale, MD  pantoprazole (PROTONIX) 40 MG tablet Take 1 tablet (40 mg total) by mouth daily. 05/16/21   Shon Hale, MD  PARoxetine (PAXIL) 20 MG tablet Take 1 tablet (20 mg total) by mouth daily. 05/16/21   Shon Hale, MD     Critical care time: 45 minutes

## 2021-06-14 NOTE — ED Notes (Signed)
md made aware of low bp.  Pt currently getting fluid bolus.

## 2021-06-14 NOTE — ED Triage Notes (Signed)
Pt brought in by [facility] from home with c/o chest pain. EMS arrived and reports pt was combative and hallucinating. O2 sat 88% on RA. EMS initially thought maybe his blood sugar was low, but it was 123. Bowel and urine incontinence upon their arrival. SBP 90, NS bolus started per EMS. Roommate reported that pt fell at home yesterday but was acting "different" before the fall. Responsive to painful stimuli per EMS.

## 2021-06-15 ENCOUNTER — Inpatient Hospital Stay (HOSPITAL_COMMUNITY): Payer: 59

## 2021-06-15 ENCOUNTER — Other Ambulatory Visit: Payer: Self-pay

## 2021-06-15 DIAGNOSIS — R410 Disorientation, unspecified: Secondary | ICD-10-CM

## 2021-06-15 DIAGNOSIS — R578 Other shock: Secondary | ICD-10-CM

## 2021-06-15 LAB — VOLATILES,BLD-ACETONE,ETHANOL,ISOPROP,METHANOL
Acetone, blood: <0.01 g/dL (ref 0.000–0.010)
Ethanol, blood: <0.01 g/dL (ref 0.000–0.010)
Isopropanol, blood: <0.01 g/dL (ref 0.000–0.010)
Methanol, blood: <0.01 g/dL (ref 0.000–0.010)

## 2021-06-15 LAB — BASIC METABOLIC PANEL
Anion gap: 15 (ref 5–15)
BUN: 52 mg/dL — ABNORMAL HIGH (ref 8–23)
CO2: 16 mmol/L — ABNORMAL LOW (ref 22–32)
Calcium: 8.2 mg/dL — ABNORMAL LOW (ref 8.9–10.3)
Chloride: 108 mmol/L (ref 98–111)
Creatinine, Ser: 5.18 mg/dL — ABNORMAL HIGH (ref 0.61–1.24)
GFR, Estimated: 11 mL/min — ABNORMAL LOW (ref >60)
Glucose, Bld: 137 mg/dL — ABNORMAL HIGH (ref 70–99)
Potassium: 4.9 mmol/L (ref 3.5–5.1)
Sodium: 139 mmol/L (ref 135–145)

## 2021-06-15 LAB — POCT I-STAT 7, (LYTES, BLD GAS, ICA,H+H)
Acid-base deficit: 7 mmol/L — ABNORMAL HIGH (ref 0.0–2.0)
Acid-base deficit: 9 mmol/L — ABNORMAL HIGH (ref 0.0–2.0)
Bicarbonate: 18 mmol/L — ABNORMAL LOW (ref 20.0–28.0)
Bicarbonate: 18.3 mmol/L — ABNORMAL LOW (ref 20.0–28.0)
Calcium, Ion: 1.15 mmol/L (ref 1.15–1.40)
Calcium, Ion: 1.18 mmol/L (ref 1.15–1.40)
HCT: 37 % — ABNORMAL LOW (ref 39.0–52.0)
HCT: 38 % — ABNORMAL LOW (ref 39.0–52.0)
Hemoglobin: 12.6 g/dL — ABNORMAL LOW (ref 13.0–17.0)
Hemoglobin: 12.9 g/dL — ABNORMAL LOW (ref 13.0–17.0)
O2 Saturation: 100 %
O2 Saturation: 99 %
Patient temperature: 98.6
Patient temperature: 98.8
Potassium: 4.5 mmol/L (ref 3.5–5.1)
Potassium: 5.1 mmol/L (ref 3.5–5.1)
Sodium: 139 mmol/L (ref 135–145)
Sodium: 142 mmol/L (ref 135–145)
TCO2: 19 mmol/L — ABNORMAL LOW (ref 22–32)
TCO2: 19 mmol/L — ABNORMAL LOW (ref 22–32)
pCO2 arterial: 36.6 mmHg (ref 32.0–48.0)
pCO2 arterial: 43.4 mmHg (ref 32.0–48.0)
pH, Arterial: 7.226 — ABNORMAL LOW (ref 7.350–7.450)
pH, Arterial: 7.308 — ABNORMAL LOW (ref 7.350–7.450)
pO2, Arterial: 148 mmHg — ABNORMAL HIGH (ref 83.0–108.0)
pO2, Arterial: 222 mmHg — ABNORMAL HIGH (ref 83.0–108.0)

## 2021-06-15 LAB — COOXEMETRY PANEL
Carboxyhemoglobin: 1.3 % (ref 0.5–1.5)
Methemoglobin: 1 % (ref 0.0–1.5)
O2 Saturation: 65.1 %
Total hemoglobin: 11.9 g/dL — ABNORMAL LOW (ref 12.0–16.0)

## 2021-06-15 LAB — CBC
HCT: 39.8 % (ref 39.0–52.0)
Hemoglobin: 12.7 g/dL — ABNORMAL LOW (ref 13.0–17.0)
MCH: 27.8 pg (ref 26.0–34.0)
MCHC: 31.9 g/dL (ref 30.0–36.0)
MCV: 87.1 fL (ref 80.0–100.0)
Platelets: 242 10*3/uL (ref 150–400)
RBC: 4.57 MIL/uL (ref 4.22–5.81)
RDW: 17.8 % — ABNORMAL HIGH (ref 11.5–15.5)
WBC: 17.5 10*3/uL — ABNORMAL HIGH (ref 4.0–10.5)
nRBC: 0 % (ref 0.0–0.2)

## 2021-06-15 LAB — GLUCOSE, CAPILLARY
Glucose-Capillary: 122 mg/dL — ABNORMAL HIGH (ref 70–99)
Glucose-Capillary: 122 mg/dL — ABNORMAL HIGH (ref 70–99)
Glucose-Capillary: 126 mg/dL — ABNORMAL HIGH (ref 70–99)
Glucose-Capillary: 128 mg/dL — ABNORMAL HIGH (ref 70–99)
Glucose-Capillary: 141 mg/dL — ABNORMAL HIGH (ref 70–99)
Glucose-Capillary: 96 mg/dL (ref 70–99)

## 2021-06-15 LAB — PROTIME-INR
INR: 1.2 (ref 0.8–1.2)
Prothrombin Time: 15.4 seconds — ABNORMAL HIGH (ref 11.4–15.2)

## 2021-06-15 LAB — MAGNESIUM: Magnesium: 1.9 mg/dL (ref 1.7–2.4)

## 2021-06-15 LAB — CK: Total CK: 821 U/L — ABNORMAL HIGH (ref 49–397)

## 2021-06-15 LAB — OSMOLALITY: Osmolality: 313 mOsm/kg — ABNORMAL HIGH (ref 275–295)

## 2021-06-15 LAB — ECHOCARDIOGRAM COMPLETE: Area-P 1/2: 3.1 cm2

## 2021-06-15 LAB — MRSA NEXT GEN BY PCR, NASAL: MRSA by PCR Next Gen: NOT DETECTED

## 2021-06-15 LAB — PHOSPHORUS: Phosphorus: 5.2 mg/dL — ABNORMAL HIGH (ref 2.5–4.6)

## 2021-06-15 MED ORDER — NALOXONE HCL 0.4 MG/ML IJ SOLN: 0.4000 mg | INTRAMUSCULAR | Status: DC | PRN

## 2021-06-15 MED ORDER — MAGNESIUM SULFATE 2 GM/50ML IV SOLN
2.0000 g | Freq: Once | INTRAVENOUS | Status: AC
  Administered 2021-06-15: 2 g via INTRAVENOUS
  Filled 2021-06-15: qty 50

## 2021-06-15 MED ORDER — SODIUM CHLORIDE 0.9 % IV BOLUS
500.0000 mL | Freq: Once | INTRAVENOUS | Status: AC
  Administered 2021-06-15: 500 mL via INTRAVENOUS

## 2021-06-15 MED ORDER — APIXABAN 5 MG PO TABS
5.0000 mg | ORAL_TABLET | Freq: Two times a day (BID) | ORAL | Status: DC
  Administered 2021-06-15 – 2021-06-20 (×11): 5 mg via ORAL
  Filled 2021-06-15 (×11): qty 1

## 2021-06-15 MED ORDER — NALOXONE HCL 4 MG/10ML IJ SOLN
1.0000 mg/h | INTRAVENOUS | Status: DC
  Administered 2021-06-15: 1 mg/h via INTRAVENOUS
  Administered 2021-06-15: 0.5 mg/h via INTRAVENOUS
  Filled 2021-06-15 (×3): qty 10

## 2021-06-15 MED ORDER — PERFLUTREN LIPID MICROSPHERE
1.0000 mL | INTRAVENOUS | Status: AC | PRN
  Administered 2021-06-15: 4 mL via INTRAVENOUS
  Filled 2021-06-15: qty 10

## 2021-06-15 NOTE — Progress Notes (Signed)
Echocardiogram 2D Echocardiogram has been performed.  Warren Lacy Emilyn Ruble RDCS 06/15/2021, 10:00 AM

## 2021-06-15 NOTE — Procedures (Signed)
Arterial Catheter Insertion Procedure Note  Roger Mooney  [id number]  Oct 04, 1951  Date:06/15/21  Time:12:28 AM    Provider Performing: Jolyn Nap    Procedure: Insertion of Arterial Line (75170) with US guidance (01749)   Indication(s) Blood pressure monitoring and/or need for frequent ABGs  Consent Unable to attain given no other decision maker found and emergent nature of procedure  Anesthesia None   Time Out Verified patient identification, verified procedure, site/side was marked, verified correct patient position, special equipment/implants available, medications/allergies/relevant history reviewed, required imaging and test results available.   Sterile Technique Maximal sterile technique including full sterile barrier drape, hand hygiene, sterile gown, sterile gloves, mask, hair covering, sterile ultrasound probe cover (if used).   Procedure Description Area of catheter insertion was cleaned with chlorhexidine and draped in sterile fashion. With real-time ultrasound guidance an arterial catheter was placed into the left radial artery.  Appropriate arterial tracings confirmed on monitor.     Complications/Tolerance None; patient tolerated the procedure well.   EBL Minimal   Specimen(s) None

## 2021-06-15 NOTE — Progress Notes (Signed)
Pt BP noted to drop with MAPs in the 50s. Low dose levo restarted, MD notified. Order given for co.ox to be drawn and 500cc NS bolus at this time.

## 2021-06-15 NOTE — Progress Notes (Signed)
eLink Physician-Brief Progress Note Patient Name: Roger Mooney DOB: 07/28/1951 MRN: [id number]   Date of Service  06/15/2021  HPI/Events of Note  Nursing request for f/u ABG this AM.   eICU Interventions  Plan: ABG at 5 AM.      Intervention Category Major Interventions: Respiratory failure - evaluation and management  Lenell Antu 06/15/2021, 4:43 AM

## 2021-06-15 NOTE — H&P (Signed)
NAME:  Roger Mooney, MRN:  [id number], DOB:  1951/11/04, LOS: 1 ADMISSION DATE:  06/14/2021, CONSULTATION DATE:  06/14/21 REFERRING MD: Dr. Audley Hose , CHIEF COMPLAINT:  Shock   History of Present Illness:  This is a 70 yo with history of DM, polysubstance abuse,CAD (history of MI sp angioplasty and LAD stent), CKD3, HFrEF (EF 25%)and HTN who presents for confusion and AMS. Patients roommate noted that patient was combative and hallucinating and thus called EMS.  EMS arrived and noted patient was confused and unable to provide history. ED unable to get history from patient as he was altered and providing inappropriate answers to questions.   In ED wokrup remarkable for negative COVID testing, Leukocytosis of 17.2, Creatinine of 7.67 with potassium of 5.6. AST and ALT normal, etoh normal, normal troponin, BNP of 56, normal ammonia of 16, LA of 1.2, UA with moderate LE and rare bacteria. UA positive for THC and opiates.  CXR noting-1. Persistent low lung volumes, with some stable faint opacity at the left lung base likely representing scarring in the lingula. Persistent fine interstitial accentuation, without overt edema.  On admission patient had arterial line placed. Became progressively somnolent with concern that he would need intubation. Narcan given just before and patient improved immensely.   Pertinent  Medical History  Polysubstance abuse-Previous UA notabve for cocaine, THC, Opiates, and benzos,  CAD (history of MI sp angioplasty and LAD stent),  CKD3,  HFrEF (EF 25%) HTN   Recent admissions to [facility] 4/22 with septic shock and [facility] with HF in 4/22. 6/22 seen [facility] for chest pain - ruled out for ACS, Palliative care saw patient at the time.   Significant Hospital Events: Including procedures, antibiotic start and stop dates in addition to other pertinent events   Admitted to ICU on 06/14/21 7/3 - arterial line inserted.   Interim History / Subjective:   Awake this morning. Denies chest  pain. Poor historian but seems to indicate that he had been in his normal state of health. Admitted to [facility] use and taking morphine pills yesterday for chest pain, prescribed by his PCP.   Objective   Blood pressure 107/68, pulse 83, temperature 98 F (36.7 C), resp. rate (!) 23, height 5\' 11"  (1.803 m), weight 75.6 kg, SpO2 99 %. CVP:  [1 mmHg-11 mmHg] 1 mmHg  FiO2 (%):  [40 %] 40 %   Intake/Output Summary (Last 24 hours) at 06/15/2021 0828 Last data filed at 06/15/2021 0800 Gross per 24 hour  Intake 5739.7 ml  Output 4325 ml  Net 1414.7 ml    Filed Weights   06/14/21 1440 06/14/21 2300 06/15/21 0441  Weight: 76.8 kg 78.2 kg 75.6 kg   Examination: General: Patient awake and interactive HENT: Moist mucous membranes Lungs: CTAB Cardiovascular: RRR-No murmurs noted, JVP not elevated.  Abdomen: Soft non tender and non distended Extremities: No deformites appreciated Neuro: Awake and interactive with no focal deficits. GU: Foly in place.   Resolved Hospital Problem list   NA  Assessment & Plan:   Critically ill due to hypotension 2/2 overdose on opiates requiring titration of norepinephrine.  Critically ill due to Encephalopathy due to Polysubstance abuse-Patient responded very well to narcan infusion.  AKI with hyperkalemia- Cr now near baseline.  HFrEF- clinically not decompensated.  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 - if no recurrence of symptoms by this afternoon,  will likely discharge home as no signs of decompensated heart failure.  -  Will add Imdur to regimen for ischemia prevention, better option that oral morphine (which is not on his home medication list!)  Best Practice (right click and "Reselect all SmartList Selections" daily)   Diet/type: NPO progressive diet prior to discharge.  DVT prophylaxis: prophylactic heparin  GI prophylaxis: N/A Lines: Central line Foley:  Yes, and it is still needed Code Status:  full  code Last date of multidisciplinary goals of care discussion [Attempted to call partner]  Labs   CBC: Recent Labs  Lab 06/14/21 1550 06/14/21 2247 06/14/21 2253 06/15/21 0040 06/15/21 0343 06/15/21 0510  WBC 17.2* 18.8*  --   --  17.5*  --   NEUTROABS 15.3* 15.3*  --   --   --   --   HGB 12.6* 12.4* 12.6* 12.6* 12.7* 12.9*  HCT 41.3 40.4 37.0* 37.0* 39.8 38.0*  MCV 88.4 88.0  --   --  87.1  --   PLT 217 237  --   --  242  --      Basic Metabolic Panel: Recent Labs  Lab 06/14/21 1550 06/14/21 2247 06/14/21 2253 06/15/21 0040 06/15/21 0343 06/15/21 0510  NA 138 138 142 142 139 139  K 5.6* 5.1 5.1 4.5 4.9 5.1  CL 104 109  --   --  108  --   CO2 21* 18*  --   --  16*  --   GLUCOSE 91 112*  --   --  137*  --   BUN 62* 56*  --   --  52*  --   CREATININE 7.67* 6.32*  --   --  5.18*  --   CALCIUM 7.9* 7.5*  --   --  8.2*  --   MG  --  1.5*  --   --  1.9  --   PHOS  --  6.9*  --   --  5.2*  --     GFR: Estimated Creatinine Clearance: 14.3 mL/min (A) (by C-G formula based on SCr of 5.18 mg/dL (H)). Recent Labs  Lab 06/14/21 1550 06/14/21 1732 06/14/21 2247 06/15/21 0343  PROCALCITON  --   --  1.18  --   WBC 17.2*  --  18.8* 17.5*  LATICACIDVEN 1.2 1.1 0.6  --      Liver Function Tests: Recent Labs  Lab 06/14/21 1550 06/14/21 2247  AST 27 29  ALT 15 16  ALKPHOS 52 46  BILITOT 1.7* 1.7*  PROT 7.0 6.2*  ALBUMIN 3.5 3.1*    No results for input(s): LIPASE, AMYLASE in the last 168 hours. Recent Labs  Lab 06/14/21 1550  AMMONIA 16     ABG    Component Value Date/Time   PHART 7.308 (L) 06/15/2021 0510   PCO2ART 36.6 06/15/2021 0510   PO2ART 148 (H) 06/15/2021 0510   HCO3 18.3 (L) 06/15/2021 0510   TCO2 19 (L) 06/15/2021 0510   ACIDBASEDEF 7.0 (H) 06/15/2021 0510   O2SAT 99.0 06/15/2021 0510      Coagulation Profile: Recent Labs  Lab 06/14/21 2247  INR 1.2    Cardiac Enzymes: Recent Labs  Lab 06/15/21 0100  CKTOTAL 821*     HbA1C: Hgb A1c MFr Bld  Date/Time Value Ref Range Status  06/14/2021 10:47 PM 6.0 (H) 4.8 - 5.6 % Final    Comment:    (NOTE) Pre diabetes:          5.7%-6.4%  Diabetes:              >  6.4%  Glycemic control for   <7.0% adults with diabetes     CBG: Recent Labs  Lab 06/14/21 2241 06/15/21 0349 06/15/21 0756  GLUCAP 105* 128* 141*   CRITICAL CARE Performed by: Lynnell Catalan   Total critical care time: 35 minutes  Critical care time was exclusive of separately billable procedures and treating other patients.  Critical care was necessary to treat or prevent imminent or life-threatening deterioration.  Critical care was time spent personally by me on the following activities: development of treatment plan with patient and/or surrogate as well as nursing, discussions with consultants, evaluation of patient's response to treatment, examination of patient, obtaining history from patient or surrogate, ordering and performing treatments and interventions, ordering and review of laboratory studies, ordering and review of radiographic studies, pulse oximetry, re-evaluation of patient's condition and participation in multidisciplinary rounds.  Lynnell Catalan, MD [facility] ICU Physician [facility]  Pager: [phone] Mobile: [phone] After hours: [phone].

## 2021-06-15 NOTE — Plan of Care (Signed)
  Problem: Cardiac: Goal: Ability to achieve and maintain adequate cardiopulmonary perfusion will improve Outcome: Progressing Goal: Vascular access site(s) Level 0-1 will be maintained Outcome: Progressing   Problem: Fluid Volume: Goal: Ability to achieve a balanced intake and output will improve Outcome: Progressing   Problem: Physical Regulation: Goal: Complications related to the disease process, condition or treatment will be avoided or minimized Outcome: Progressing   Problem: Respiratory: Goal: Will regain and/or maintain adequate ventilation Outcome: Progressing   Problem: Education: Goal: Knowledge of General Education information will improve Description: Including pain rating scale, medication(s)/side effects and non-pharmacologic comfort measures Outcome: Progressing   Problem: Health Behavior/Discharge Planning: Goal: Ability to manage health-related needs will improve Outcome: Progressing   Problem: Clinical Measurements: Goal: Ability to maintain clinical measurements within normal limits will improve Outcome: Progressing Goal: Will remain free from infection Outcome: Progressing Goal: Diagnostic test results will improve Outcome: Progressing Goal: Respiratory complications will improve Outcome: Progressing Goal: Cardiovascular complication will be avoided Outcome: Progressing   Problem: Activity: Goal: Risk for activity intolerance will decrease Outcome: Progressing   Problem: Nutrition: Goal: Adequate nutrition will be maintained Outcome: Progressing   Problem: Coping: Goal: Level of anxiety will decrease Outcome: Progressing   Problem: Elimination: Goal: Will not experience complications related to bowel motility Outcome: Progressing Goal: Will not experience complications related to urinary retention Outcome: Progressing   Problem: Pain Managment: Goal: General experience of comfort will improve Outcome: Progressing   Problem:  Safety: Goal: Ability to remain free from injury will improve Outcome: Progressing   Problem: Skin Integrity: Goal: Risk for impaired skin integrity will decrease Outcome: Progressing

## 2021-06-15 NOTE — Progress Notes (Signed)
Spoke with pt son Lorin Picket. RN informed that pt lives with daughter, [name]. [name] is back and forth helping care for [place] in [place] as well as pt in [place]. Lorin Picket states that pt has been out of home medications for one week and has no PCP at this time. Lorin Picket states that pt lives next to a "drug dealer" and goes over frequently.   Lorin Picket stated he could be reached at [phone].

## 2021-06-16 LAB — COMPREHENSIVE METABOLIC PANEL
ALT: 14 U/L (ref 0–44)
AST: 22 U/L (ref 15–41)
Albumin: 2.9 g/dL — ABNORMAL LOW (ref 3.5–5.0)
Alkaline Phosphatase: 48 U/L (ref 38–126)
Anion gap: 9 (ref 5–15)
BUN: 40 mg/dL — ABNORMAL HIGH (ref 8–23)
CO2: 22 mmol/L (ref 22–32)
Calcium: 8.5 mg/dL — ABNORMAL LOW (ref 8.9–10.3)
Chloride: 108 mmol/L (ref 98–111)
Creatinine, Ser: 2.62 mg/dL — ABNORMAL HIGH (ref 0.61–1.24)
GFR, Estimated: 26 mL/min — ABNORMAL LOW (ref >60)
Glucose, Bld: 132 mg/dL — ABNORMAL HIGH (ref 70–99)
Potassium: 4 mmol/L (ref 3.5–5.1)
Sodium: 139 mmol/L (ref 135–145)
Total Bilirubin: 2.1 mg/dL — ABNORMAL HIGH (ref 0.3–1.2)
Total Protein: 6 g/dL — ABNORMAL LOW (ref 6.5–8.1)

## 2021-06-16 LAB — GLUCOSE, CAPILLARY
Glucose-Capillary: 103 mg/dL — ABNORMAL HIGH (ref 70–99)
Glucose-Capillary: 107 mg/dL — ABNORMAL HIGH (ref 70–99)
Glucose-Capillary: 108 mg/dL — ABNORMAL HIGH (ref 70–99)
Glucose-Capillary: 113 mg/dL — ABNORMAL HIGH (ref 70–99)
Glucose-Capillary: 80 mg/dL (ref 70–99)
Glucose-Capillary: 94 mg/dL (ref 70–99)

## 2021-06-16 LAB — MAGNESIUM: Magnesium: 1.2 mg/dL — ABNORMAL LOW (ref 1.7–2.4)

## 2021-06-16 MED ORDER — ACETAMINOPHEN 325 MG PO TABS
650.0000 mg | ORAL_TABLET | Freq: Four times a day (QID) | ORAL | Status: DC | PRN
  Administered 2021-06-16 – 2021-06-18 (×3): 650 mg via ORAL
  Filled 2021-06-16 (×3): qty 2

## 2021-06-16 MED ORDER — TRAMADOL HCL 50 MG PO TABS
50.0000 mg | ORAL_TABLET | Freq: Four times a day (QID) | ORAL | Status: DC | PRN
  Administered 2021-06-16 – 2021-06-19 (×8): 50 mg via ORAL
  Filled 2021-06-16 (×8): qty 1

## 2021-06-16 MED ORDER — MAGNESIUM SULFATE 2 GM/50ML IV SOLN
2.0000 g | Freq: Once | INTRAVENOUS | Status: AC
  Administered 2021-06-16: 2 g via INTRAVENOUS
  Filled 2021-06-16: qty 50

## 2021-06-16 MED ORDER — LACTATED RINGERS IV BOLUS
500.0000 mL | Freq: Once | INTRAVENOUS | Status: AC
  Administered 2021-06-16: 500 mL via INTRAVENOUS

## 2021-06-16 NOTE — Progress Notes (Signed)
eLink Physician-Brief Progress Note Patient Name: Roger Mooney DOB: [birth year] MRN: [id number]   Date of Service  06/16/2021  HPI/Events of Note  Hypomagnesemia - Mg++ = 1.2 and Creatinine = 2.62.  eICU Interventions  Will replace Mg++.     Intervention Category Major Interventions: Electrolyte abnormality - evaluation and management  [name] 06/16/2021, 1:56 AM

## 2021-06-16 NOTE — Progress Notes (Signed)
Attempting to wean levo to off. Arterial line sys BP drop to 89, MAP 58; direct correlation with cuff pressure. Levo titrated back up to 2 mcg/min. Will continue to assess readiness to wean levo to off.

## 2021-06-16 NOTE — Progress Notes (Signed)
NAME:  Roger Mooney, MRN:  [id number], DOB:  11/25/1951, LOS: 2 ADMISSION DATE:  06/14/2021, CONSULTATION DATE:  06/14/21 REFERRING MD: Dr. Audley Hose , CHIEF COMPLAINT:  Shock   History of Present Illness:  This is a 70 yo with history of DM, polysubstance abuse,CAD (history of MI sp angioplasty and LAD stent), CKD3, HFrEF (EF 25%)and HTN who presents for confusion and AMS. Patients roommate noted that patient was combative and hallucinating and thus called EMS.  EMS arrived and noted patient was confused and unable to provide history. ED unable to get history from patient as he was altered and providing inappropriate answers to questions.   In ED wokrup remarkable for negative COVID testing, Leukocytosis of 17.2, Creatinine of 7.67 with potassium of 5.6. AST and ALT normal, etoh normal, normal troponin, BNP of 56, normal ammonia of 16, LA of 1.2, UA with moderate LE and rare bacteria. UA positive for THC and opiates.  CXR noting-1. Persistent low lung volumes, with some stable faint opacity at the left lung base likely representing scarring in the lingula. Persistent fine interstitial accentuation, without overt edema.  On admission patient had arterial line placed. Became progressively somnolent with concern that he would need intubation. Narcan given just before and patient improved immensely.   Pertinent  Medical History  Polysubstance abuse-Previous UA notabve for cocaine, THC, Opiates, and benzos,  CAD (history of MI sp angioplasty and LAD stent),  CKD3,  HFrEF (EF 25%) HTN   Recent admissions to [facility] 4/22 with septic shock and [facility] with HF in 4/22. 6/22 seen [facility] for chest pain - ruled out for ACS, Palliative care saw patient at the time.   Significant Hospital Events: Including procedures, antibiotic start and stop dates in addition to other pertinent events   Admitted to ICU on 06/14/21 7/3 - arterial line inserted.   Interim History / Subjective:   Blood pressure improved with  fluid administration. Complains of heart pain in back.   Objective   Blood pressure 124/71, pulse (!) 58, temperature 98.4 F (36.9 C), temperature source Oral, resp. rate 12, height 5\' 11"  (1.803 m), weight 76.1 kg, SpO2 99 %. CVP:  [0 mmHg-16 mmHg] 5 mmHg      Intake/Output Summary (Last 24 hours) at 06/16/2021 0752 Last data filed at 06/16/2021 0700 Gross per 24 hour  Intake 1832.71 ml  Output 2825 ml  Net -992.29 ml    Filed Weights   06/14/21 2300 06/15/21 0441 06/16/21 0500  Weight: 78.2 kg 75.6 kg 76.1 kg   Examination: General: Patient awake and interactive HENT: Moist mucous membranes Lungs: no wheezes or crackles.  Pain in back subscapular and reproducible by palpation.  Cardiovascular: RRR-No murmurs noted, JVP not elevated.  Abdomen: Soft non tender and non distended Extremities: No deformites appreciated Neuro: Awake and interactive with no focal deficits. GU: Foley has been removed.   Resolved Hospital Problem list   NA  Assessment & Plan:   Critically ill due to hypotension 2/2 overdose on opiates requiring titration of norepinephrine.  Was Critically ill due to Encephalopathy due to Polysubstance abuse-Patient responded very well to narcan infusion now resolved.  AKI with hyperkalemia- Cr now near baseline.  HFrEF- clinically not decompensated.  DM-2-On metformin at home Musculoskeletal back pain.   Plan:   - Stop NE at this time. Remove monitoring lines.  - If BP remains stable, will sequentially introduce HF medication starting with ACEi and beta-blocker - Hold on diuretics as appears euvolemic at this time. -  Will add Imdur to regimen for ischemia prevention, better option that oral morphine (which is not on his home medication list!) - Avoid narcotics - continue Tylenol and consider lidocaine patch.   Best Practice (right click and "Reselect all SmartList Selections" daily)   Diet/type: NPO progressive diet prior to discharge.  DVT prophylaxis:  prophylactic heparin  GI prophylaxis: N/A Lines: No longer needed.  Order written to d/c  Foley:  N/A Code Status:  full code Last date of multidisciplinary goals of care discussion [Attempted to call partner] Transitions of care contacted.  Compliance may be difficult to encourage.   Labs   CBC: Recent Labs  Lab 06/14/21 1550 06/14/21 2247 06/14/21 2253 06/15/21 0040 06/15/21 0343 06/15/21 0510  WBC 17.2* 18.8*  --   --  17.5*  --   NEUTROABS 15.3* 15.3*  --   --   --   --   HGB 12.6* 12.4* 12.6* 12.6* 12.7* 12.9*  HCT 41.3 40.4 37.0* 37.0* 39.8 38.0*  MCV 88.4 88.0  --   --  87.1  --   PLT 217 237  --   --  242  --      Basic Metabolic Panel: Recent Labs  Lab 06/14/21 1550 06/14/21 2247 06/14/21 2253 06/15/21 0040 06/15/21 0343 06/15/21 0510 06/16/21 0051  NA 138 138 142 142 139 139 139  K 5.6* 5.1 5.1 4.5 4.9 5.1 4.0  CL 104 109  --   --  108  --  108  CO2 21* 18*  --   --  16*  --  22  GLUCOSE 91 112*  --   --  137*  --  132*  BUN 62* 56*  --   --  52*  --  40*  CREATININE 7.67* 6.32*  --   --  5.18*  --  2.62*  CALCIUM 7.9* 7.5*  --   --  8.2*  --  8.5*  MG  --  1.5*  --   --  1.9  --  1.2*  PHOS  --  6.9*  --   --  5.2*  --   --     GFR: Estimated Creatinine Clearance: 28.3 mL/min (A) (by C-G formula based on SCr of 2.62 mg/dL (H)). Recent Labs  Lab 06/14/21 1550 06/14/21 1732 06/14/21 2247 06/15/21 0343  PROCALCITON  --   --  1.18  --   WBC 17.2*  --  18.8* 17.5*  LATICACIDVEN 1.2 1.1 0.6  --      Liver Function Tests: Recent Labs  Lab 06/14/21 1550 06/14/21 2247 06/16/21 0051  AST 27 29 22   ALT 15 16 14   ALKPHOS 52 46 48  BILITOT 1.7* 1.7* 2.1*  PROT 7.0 6.2* 6.0*  ALBUMIN 3.5 3.1* 2.9*    No results for input(s): LIPASE, AMYLASE in the last 168 hours. Recent Labs  Lab 06/14/21 1550  AMMONIA 16     ABG    Component Value Date/Time   PHART 7.308 (L) 06/15/2021 0510   PCO2ART 36.6 06/15/2021 0510   PO2ART 148 (H)  06/15/2021 0510   HCO3 18.3 (L) 06/15/2021 0510   TCO2 19 (L) 06/15/2021 0510   ACIDBASEDEF 7.0 (H) 06/15/2021 0510   O2SAT 65.1 06/15/2021 1121      Coagulation Profile: Recent Labs  Lab 06/14/21 2247 06/15/21 1135  INR 1.2 1.2     Cardiac Enzymes: Recent Labs  Lab 06/15/21 0100  CKTOTAL 821*     HbA1C: Hgb A1c MFr Bld  Date/Time Value Ref Range Status  06/14/2021 10:47 PM 6.0 (H) 4.8 - 5.6 % Final    Comment:    (NOTE) Pre diabetes:          5.7%-6.4%  Diabetes:              >6.4%  Glycemic control for   <7.0% adults with diabetes     CBG: Recent Labs  Lab 06/15/21 1127 06/15/21 1558 06/15/21 1932 06/15/21 2347 06/16/21 0351  GLUCAP 96 122* 126* 122* 107*    CRITICAL CARE Performed by: Lynnell Catalan   Total critical care time: 35 minutes  Critical care time was exclusive of separately billable procedures and treating other patients.  Critical care was necessary to treat or prevent imminent or life-threatening deterioration.  Critical care was time spent personally by me on the following activities: development of treatment plan with patient and/or surrogate as well as nursing, discussions with consultants, evaluation of patient's response to treatment, examination of patient, obtaining history from patient or surrogate, ordering and performing treatments and interventions, ordering and review of laboratory studies, ordering and review of radiographic studies, pulse oximetry, re-evaluation of patient's condition and participation in multidisciplinary rounds.  Lynnell Catalan, MD [facility] ICU Physician [facility] Critical Care  Pager: [phone] Mobile: [phone] After hours: [phone].

## 2021-06-16 NOTE — Progress Notes (Signed)
eLink Physician-Brief Progress Note Patient Name: Roger Mooney DOB: 1951-08-21 MRN: [id number]   Date of Service  06/16/2021  HPI/Events of Note  Generalized Pain - AST and ALT both normal.   eICU Interventions  Plan: Tylenol 650 mg PO Q 6 Hours PRN pain.      Intervention Category Major Interventions: Other:  Roger Mooney 06/16/2021, 4:57 AM

## 2021-06-16 NOTE — Plan of Care (Signed)
  Problem: Cardiac: Goal: Ability to achieve and maintain adequate cardiopulmonary perfusion will improve Outcome: Progressing Goal: Vascular access site(s) Level 0-1 will be maintained Outcome: Progressing   Problem: Fluid Volume: Goal: Ability to achieve a balanced intake and output will improve Outcome: Progressing   Problem: Physical Regulation: Goal: Complications related to the disease process, condition or treatment will be avoided or minimized Outcome: Progressing   Problem: Respiratory: Goal: Will regain and/or maintain adequate ventilation Outcome: Progressing   Problem: Education: Goal: Knowledge of General Education information will improve Description: Including pain rating scale, medication(s)/side effects and non-pharmacologic comfort measures Outcome: Progressing   Problem: Health Behavior/Discharge Planning: Goal: Ability to manage health-related needs will improve Outcome: Progressing   Problem: Clinical Measurements: Goal: Ability to maintain clinical measurements within normal limits will improve Outcome: Progressing Goal: Will remain free from infection Outcome: Progressing Goal: Diagnostic test results will improve Outcome: Progressing Goal: Respiratory complications will improve Outcome: Progressing Goal: Cardiovascular complication will be avoided Outcome: Progressing   Problem: Activity: Goal: Risk for activity intolerance will decrease Outcome: Progressing   Problem: Nutrition: Goal: Adequate nutrition will be maintained Outcome: Progressing   Problem: Coping: Goal: Level of anxiety will decrease Outcome: Progressing   Problem: Elimination: Goal: Will not experience complications related to bowel motility Outcome: Progressing Goal: Will not experience complications related to urinary retention Outcome: Progressing   Problem: Pain Managment: Goal: General experience of comfort will improve Outcome: Progressing   Problem:  Safety: Goal: Ability to remain free from injury will improve Outcome: Progressing   Problem: Skin Integrity: Goal: Risk for impaired skin integrity will decrease Outcome: Progressing   

## 2021-06-16 NOTE — Progress Notes (Signed)
Spoke with Jennifer,RN and made her aware of the discontinue central line order.    Penni Bombard Shann Merrick,RN-VAST

## 2021-06-17 ENCOUNTER — Other Ambulatory Visit (HOSPITAL_COMMUNITY): Payer: Self-pay

## 2021-06-17 DIAGNOSIS — Z978 Presence of other specified devices: Secondary | ICD-10-CM

## 2021-06-17 LAB — GLUCOSE, CAPILLARY
Glucose-Capillary: 106 mg/dL — ABNORMAL HIGH (ref 70–99)
Glucose-Capillary: 96 mg/dL (ref 70–99)
Glucose-Capillary: 105 mg/dL — ABNORMAL HIGH (ref 70–99)

## 2021-06-17 LAB — COMPREHENSIVE METABOLIC PANEL
ALT: 14 U/L (ref 0–44)
AST: 20 U/L (ref 15–41)
Albumin: 3 g/dL — ABNORMAL LOW (ref 3.5–5.0)
Alkaline Phosphatase: 41 U/L (ref 38–126)
Anion gap: 8 (ref 5–15)
BUN: 26 mg/dL — ABNORMAL HIGH (ref 8–23)
CO2: 22 mmol/L (ref 22–32)
Calcium: 8.6 mg/dL — ABNORMAL LOW (ref 8.9–10.3)
Chloride: 106 mmol/L (ref 98–111)
Creatinine, Ser: 1.31 mg/dL — ABNORMAL HIGH (ref 0.61–1.24)
GFR, Estimated: 59 mL/min — ABNORMAL LOW (ref >60)
Glucose, Bld: 123 mg/dL — ABNORMAL HIGH (ref 70–99)
Potassium: 4.3 mmol/L (ref 3.5–5.1)
Sodium: 136 mmol/L (ref 135–145)
Total Bilirubin: 1.4 mg/dL — ABNORMAL HIGH (ref 0.3–1.2)
Total Protein: 6 g/dL — ABNORMAL LOW (ref 6.5–8.1)

## 2021-06-17 LAB — MAGNESIUM: Magnesium: 1.5 mg/dL — ABNORMAL LOW (ref 1.7–2.4)

## 2021-06-17 MED ORDER — LIDOCAINE 5 % EX PTCH
1.0000 | MEDICATED_PATCH | CUTANEOUS | Status: DC
  Administered 2021-06-17 – 2021-06-19 (×3): 1 via TRANSDERMAL
  Filled 2021-06-17 (×3): qty 1

## 2021-06-17 MED ORDER — MAGNESIUM SULFATE 4 GM/100ML IV SOLN
4.0000 g | Freq: Once | INTRAVENOUS | Status: AC
  Administered 2021-06-17: 4 g via INTRAVENOUS
  Filled 2021-06-17: qty 100

## 2021-06-17 MED ORDER — ATORVASTATIN CALCIUM 40 MG PO TABS
40.0000 mg | ORAL_TABLET | Freq: Every day | ORAL | Status: DC
  Administered 2021-06-17 – 2021-06-20 (×4): 40 mg via ORAL
  Filled 2021-06-17 (×4): qty 1

## 2021-06-17 MED ORDER — FUROSEMIDE 20 MG PO TABS
20.0000 mg | ORAL_TABLET | Freq: Every day | ORAL | Status: DC
  Administered 2021-06-17 – 2021-06-19 (×2): 20 mg via ORAL
  Filled 2021-06-17 (×4): qty 1

## 2021-06-17 MED ORDER — ISOSORBIDE MONONITRATE ER 30 MG PO TB24
30.0000 mg | ORAL_TABLET | Freq: Every day | ORAL | Status: DC
  Administered 2021-06-17 – 2021-06-19 (×2): 30 mg via ORAL
  Filled 2021-06-17 (×4): qty 1

## 2021-06-17 NOTE — Discharge Instructions (Addendum)
Information on my medicine - ELIQUIS (apixaban)  Why was Eliquis prescribed for you? Eliquis was prescribed for you to reduce the risk of a stroke from a clot found in the heart.    What do You need to know about Eliquis ? Take your Eliquis TWICE DAILY - one tablet in the morning and one tablet in the evening with or without food. If you have difficulty swallowing the tablet whole please discuss with your pharmacist how to take the medication safely.  Take Eliquis exactly as prescribed by your doctor and DO NOT stop taking Eliquis without talking to the doctor who prescribed the medication.  Stopping may increase your risk of developing a stroke.  Refill your prescription before you run out.  After discharge, you should have regular check-up appointments with your healthcare provider that is prescribing your Eliquis.  In the future your dose may need to be changed if your kidney function or weight changes by a significant amount or as you get older.  What do you do if you miss a dose? If you miss a dose, take it as soon as you remember on the same day and resume taking twice daily.  Do not take more than one dose of ELIQUIS at the same time to make up a missed dose.  Important Safety Information A possible side effect of Eliquis is bleeding. You should call your healthcare provider right away if you experience any of the following: Bleeding from an injury or your nose that does not stop. Unusual colored urine (red or dark brown) or unusual colored stools (red or black). Unusual bruising for unknown reasons. A serious fall or if you hit your head (even if there is no bleeding).  Some medicines may interact with Eliquis and might increase your risk of bleeding or clotting while on Eliquis. To help avoid this, consult your healthcare provider or pharmacist prior to using any new prescription or non-prescription medications, including herbals, vitamins, non-steroidal anti-inflammatory  drugs (NSAIDs) and supplements.  This website has more information on Eliquis (apixaban): http://www.eliquis.com/eliquis/home  

## 2021-06-17 NOTE — Progress Notes (Signed)
NAME:  Roger Mooney, MRN:  [id number], DOB:  1951-02-21, LOS: 3 ADMISSION DATE:  06/14/2021, CONSULTATION DATE:  06/14/21 REFERRING MD: Dr. Audley Hose , CHIEF COMPLAINT:  Shock   History of Present Illness:  This is a 70 yo with history of DM, polysubstance abuse,CAD (history of MI sp angioplasty and LAD stent), CKD3, HFrEF (EF 25%), LV thrombus (supposed to be on coumadin) and HTN who presented 7/2 with  confusion and AMS. Patients roommate noted that patient was combative and hallucinating and thus called EMS.  EMS arrived and noted patient was confused and unable to provide history. ED unable to get history from patient as he was altered and providing inappropriate answers to questions.   In ED wokrup remarkable for negative COVID testing, Leukocytosis of 17.2, Creatinine of 7.67 with potassium of 5.6. AST and ALT normal, etoh normal, normal troponin, BNP of 56, normal ammonia of 16, LA of 1.2, UA with moderate LE and rare bacteria. UA positive for THC and opiates.  CXR noting-1. Persistent low lung volumes, with some stable faint opacity at the left lung base likely representing scarring in the lingula. Persistent fine interstitial accentuation, without overt edema.  On admission patient had arterial line placed. Became progressively somnolent with concern that he would need intubation. Narcan given just before and patient improved immensely.   Pertinent  Medical History  Polysubstance abuse-Previous UA notabve for cocaine, THC, Opiates, and benzos,  CAD (history of MI sp angioplasty and LAD stent),  CKD3,  HFrEF (EF 25%) LV thrombus 03/2021 previously on coumadin HTN  Very HOH  Recent admissions to [facility] 4/22 with septic shock and [facility] with HF in 4/22. 6/22 seen [facility] for chest pain - ruled out for ACS, Palliative care saw patient at the time-> placed on narcan gtt.  Significant Hospital Events: Including procedures, antibiotic start and stop dates in addition to other pertinent events    Admitted to ICU on 06/14/21 with AMS, undifferentiated shock, AKI 7/3 - arterial line inserted. Off narcan gtt 7/4 off NE, aline out   7/2 cefepime, zosyn, vanc  7/2 SARS neg 7/2 MRSA Neg 7/2 Bcx2 >> neg  7/3 TTE- difficult sutdy, akinesis of anteroseptal wall, distal inferior wall and apex, large  apical thrombus noted new compared to 05/13/21, LEF 20-25%, G1DD, normal RV, borderline dilation of aortic root at 38 mm  Interim History / Subjective:  Hemodynamically stable, remains afebrile overnight  Patient not very cooperative this morning.  Extremely hard of hearing.  C/o of vague, non descriptive back and chest pain s/p ultram   Objective   Blood pressure 121/66, pulse 61, temperature 97.8 F (36.6 C), temperature source Oral, resp. rate 16, height 5\' 11"  (1.803 m), weight 76.6 kg, SpO2 94 %. CVP:  [1 mmHg-3 mmHg] 1 mmHg      Intake/Output Summary (Last 24 hours) at 06/17/2021 0745 Last data filed at 06/17/2021 0600 Gross per 24 hour  Intake 1156.1 ml  Output 850 ml  Net 306.1 ml   Filed Weights   06/15/21 0441 06/16/21 0500 06/17/21 0500  Weight: 75.6 kg 76.1 kg 76.6 kg   Examination: General:  Exam limited due to patient's cooperation/ very HOH, prefers to stay under blankets HEENT: MM pink/moist Neuro: Alert, ?confused- oriented to person, place vs just not wanting to answer questions CV: rr, NSR PULM:  clear posteriorly GI: soft, NT, +bs Extremities: warm/dry, no LE edema, left scapular "chest pain" changes with palpation/ ROM  Skin: no rashes   Resolved Hospital Problem  list   NA  Assessment & Plan:   This is a 71 yo male with history of polysubstance abuse, HFrEF sp pacer/ICD, CAD, and DM who presents encephalopathic found to be in shock likely secondary to narcotics    Hypotnesion 2/2 overdose on opiates, resolved 7/4 -Patient persistently hypotensive with evidence of end organ damage as noted by encephalopathy.  Initial thought cardiogenic vs sepsis seem like  most likely culprits although normal BNP, normal coox, and no fever bode against both these. Patient woke up immediately after narcan and blood pressure improved immensely.  - off narcan gtt 7/3 - off NE gtt 7/4 - follow blood cultures off abx, ngtd   Polysubstance abuse- Patient responded very well to narcan/ gtt. BP improved and mental status Musculoskeletal back pain.  - continue paxil 20 mg daily, do not see seroquel and neurontin ordered from previous discharge, ? Old meds - avoid narcotics, tylenol / ultram prn; consider lidocaine patches  - monitor for s/s of narcotic withdrawal - PMT re-consulted (last seen on previous admit at [facility] 05/[facility]) and TOC with ongoing substance abuse/ poor medication compliance, and recurrent admissions   AKI with hyperkalemia- baseline 1.1 Hypomag - s/p replete Mag 2gm for goal K > 2 with HFrEF - sCr continues to improve 5.18-> 2.62-> 1.31, UOP 850 with unmeasured occurrences  - Trend BMP / urinary output - Replace electrolytes as indicated - Avoid nephrotoxic agents, ensure adequate renal perfusion   HFrEF with EF 20-25%/ HFpEF/ ICD, CAD with prior LAD stent, not decompensated, tropon hs flat, unchanged TTE except for ongoing LV thrombus - BP consistently in SBP 120s - restart Lipitor 40 mg daily, lasix 20 mg daily, imdur 30 mg daily,  - hold on toprol XL 12.5 mg daily given HR 60  - holding lisinopril 5mg  daily and home bumex 0.5mg  daily given AKI, likely can restart soon as sCr is near baseline  DM-2-On metformin at home -holding metformin -SSI very sensitive, within goal -A1c 6 (06/14/2021)  LV thrombus- dx 4/22, previously on coumadin, poor compliance INRs have been normal ranges - TTE 7/3 showing larger LV thrombus compared to prior TTE 05/13/21, coincides with suspected poor compliance with home anticoagulants  - continue eliquis, given his substance abuse, bleeding risk high but outweigh risk associated with LV thrombus   Abnormal CT findings  05/12/21 -CTA chest suggest chronic hypersensitivity pneumonitis, nonemergent high-resolution chest CT recommended in 6 months/ f/u with pulm outpt will need to schedule  Best Practice (right click and "Reselect all SmartList Selections" daily)   Diet/type: Regular consistency (see orders)  DVT prophylaxis: DOAC GI prophylaxis: PPI Lines: N/A Foley:  N/A Code Status:  full code Last date of multidisciplinary goals of care discussion - patient updated on plan of care at bedside.  No family at bedside.   Stable for transfer out of ICU today.  Will transfer to cardiac tele and [facility] as of 7/6.  Labs   CBC: Recent Labs  Lab 06/14/21 1550 06/14/21 2247 06/14/21 2253 06/15/21 0040 06/15/21 0343 06/15/21 0510  WBC 17.2* 18.8*  --   --  17.5*  --   NEUTROABS 15.3* 15.3*  --   --   --   --   HGB 12.6* 12.4* 12.6* 12.6* 12.7* 12.9*  HCT 41.3 40.4 37.0* 37.0* 39.8 38.0*  MCV 88.4 88.0  --   --  87.1  --   PLT 217 237  --   --  242  --     Basic  Metabolic Panel: Recent Labs  Lab 06/14/21 1550 06/14/21 2247 06/14/21 2253 06/15/21 0040 06/15/21 0343 06/15/21 0510 06/16/21 0051 06/17/21 0030  NA 138 138   < > 142 139 139 139 136  K 5.6* 5.1   < > 4.5 4.9 5.1 4.0 4.3  CL 104 109  --   --  108  --  108 106  CO2 21* 18*  --   --  16*  --  22 22  GLUCOSE 91 112*  --   --  137*  --  132* 123*  BUN 62* 56*  --   --  52*  --  40* 26*  CREATININE 7.67* 6.32*  --   --  5.18*  --  2.62* 1.31*  CALCIUM 7.9* 7.5*  --   --  8.2*  --  8.5* 8.6*  MG  --  1.5*  --   --  1.9  --  1.2* 1.5*  PHOS  --  6.9*  --   --  5.2*  --   --   --    < > = values in this interval not displayed.   GFR: Estimated Creatinine Clearance: 56.7 mL/min (A) (by C-G formula based on SCr of 1.31 mg/dL (H)). Recent Labs  Lab 06/14/21 1550 06/14/21 1732 06/14/21 2247 06/15/21 0343  PROCALCITON  --   --  1.18  --   WBC 17.2*  --  18.8* 17.5*  LATICACIDVEN 1.2 1.1 0.6  --     Liver Function Tests: Recent Labs   Lab 06/14/21 1550 06/14/21 2247 06/16/21 0051 06/17/21 0030  AST 27 29 22 20   ALT 15 16 14 14   ALKPHOS 52 46 48 41  BILITOT 1.7* 1.7* 2.1* 1.4*  PROT 7.0 6.2* 6.0* 6.0*  ALBUMIN 3.5 3.1* 2.9* 3.0*   No results for input(s): LIPASE, AMYLASE in the last 168 hours. Recent Labs  Lab 06/14/21 1550  AMMONIA 16    ABG    Component Value Date/Time   PHART 7.308 (L) 06/15/2021 0510   PCO2ART 36.6 06/15/2021 0510   PO2ART 148 (H) 06/15/2021 0510   HCO3 18.3 (L) 06/15/2021 0510   TCO2 19 (L) 06/15/2021 0510   ACIDBASEDEF 7.0 (H) 06/15/2021 0510   O2SAT 65.1 06/15/2021 1121      Coagulation Profile: Recent Labs  Lab 06/14/21 2247 06/15/21 1135  INR 1.2 1.2    Cardiac Enzymes: Recent Labs  Lab 06/15/21 0100  CKTOTAL 821*    HbA1C: Hgb A1c MFr Bld  Date/Time Value Ref Range Status  06/14/2021 10:47 PM 6.0 (H) 4.8 - 5.6 % Final    Comment:    (NOTE) Pre diabetes:          5.7%-6.4%  Diabetes:              >6.4%  Glycemic control for   <7.0% adults with diabetes     CBG: Recent Labs  Lab 06/16/21 1133 06/16/21 1550 06/16/21 1932 06/16/21 2343 06/17/21 0318  GLUCAP 103* 94 113* 80 96        08/17/21, ACNP [facility] 06/17/2021, 7:45 AM

## 2021-06-17 NOTE — Progress Notes (Signed)
[facility] ADULT ICU REPLACEMENT PROTOCOL   The patient does apply for the [facility] Adult ICU Electrolyte Replacment Protocol based on the criteria listed below:   1. Is GFR >/= 30 ml/min? Yes.    Patient's GFR today is 59 2. Is SCr </= 2? Yes.   Patient's SCr is 1.31 ml/kg/hr 3. Did SCr increase >/= 0.5 in 24 hours? No. 4. Abnormal electrolyte(s):  Mg 1.5 5. Ordered repletion with: protocol 6. If a panic level lab has been reported, has the CCM MD in charge been notified?  Physician:  E. Reyes Ivan R Wilborn Membreno 06/17/2021 3:03 AM

## 2021-06-17 NOTE — TOC Benefit Eligibility Note (Signed)
Patient Product/process development scientist completed.    The patient is currently admitted and upon discharge could be taking Eliquis 5 mg.  The current 30 day co-pay is, $0.00.   The patient is insured through [employer] and Medicare Part D     Roger Mooney, CPhT Pharmacy Patient Advocate Specialist [facility] Antimicrobial Stewardship Team Direct Number: [phone]  Fax: [phone]

## 2021-06-18 DIAGNOSIS — Z515 Encounter for palliative care: Secondary | ICD-10-CM

## 2021-06-18 DIAGNOSIS — Z66 Do not resuscitate: Secondary | ICD-10-CM

## 2021-06-18 DIAGNOSIS — R579 Shock, unspecified: Secondary | ICD-10-CM

## 2021-06-18 DIAGNOSIS — F1910 Other psychoactive substance abuse, uncomplicated: Secondary | ICD-10-CM

## 2021-06-18 DIAGNOSIS — Z7189 Other specified counseling: Secondary | ICD-10-CM

## 2021-06-18 DIAGNOSIS — I5022 Chronic systolic (congestive) heart failure: Secondary | ICD-10-CM

## 2021-06-18 LAB — BASIC METABOLIC PANEL
Anion gap: 10 (ref 5–15)
BUN: 13 mg/dL (ref 8–23)
CO2: 23 mmol/L (ref 22–32)
Calcium: 9.2 mg/dL (ref 8.9–10.3)
Chloride: 103 mmol/L (ref 98–111)
Creatinine, Ser: 1.18 mg/dL (ref 0.61–1.24)
GFR, Estimated: >60 mL/min (ref >60)
Glucose, Bld: 94 mg/dL (ref 70–99)
Potassium: 4.1 mmol/L (ref 3.5–5.1)
Sodium: 136 mmol/L (ref 135–145)

## 2021-06-18 LAB — CBC
HCT: 43.6 % (ref 39.0–52.0)
Hemoglobin: 14 g/dL (ref 13.0–17.0)
MCH: 26.8 pg (ref 26.0–34.0)
MCHC: 32.1 g/dL (ref 30.0–36.0)
MCV: 83.5 fL (ref 80.0–100.0)
Platelets: 302 10*3/uL (ref 150–400)
RBC: 5.22 MIL/uL (ref 4.22–5.81)
RDW: 17.1 % — ABNORMAL HIGH (ref 11.5–15.5)
WBC: 11.8 10*3/uL — ABNORMAL HIGH (ref 4.0–10.5)
nRBC: 0 % (ref 0.0–0.2)

## 2021-06-18 LAB — GLUCOSE, CAPILLARY
Glucose-Capillary: 100 mg/dL — ABNORMAL HIGH (ref 70–99)
Glucose-Capillary: 101 mg/dL — ABNORMAL HIGH (ref 70–99)
Glucose-Capillary: 102 mg/dL — ABNORMAL HIGH (ref 70–99)
Glucose-Capillary: 143 mg/dL — ABNORMAL HIGH (ref 70–99)
Glucose-Capillary: 94 mg/dL (ref 70–99)
Glucose-Capillary: 97 mg/dL (ref 70–99)

## 2021-06-18 LAB — TROPONIN I (HIGH SENSITIVITY)
Troponin I (High Sensitivity): 10 ng/L (ref <18)
Troponin I (High Sensitivity): 10 ng/L (ref <18)

## 2021-06-18 LAB — MAGNESIUM: Magnesium: 1.7 mg/dL (ref 1.7–2.4)

## 2021-06-18 MED ORDER — ALUM & MAG HYDROXIDE-SIMETH 200-200-20 MG/5ML PO SUSP: 30.0000 mL | ORAL | Status: DC | PRN

## 2021-06-18 MED ORDER — MIDODRINE HCL 5 MG PO TABS
5.0000 mg | ORAL_TABLET | Freq: Three times a day (TID) | ORAL | Status: DC
  Administered 2021-06-19 – 2021-06-20 (×5): 5 mg via ORAL
  Filled 2021-06-18 (×6): qty 1

## 2021-06-18 MED ORDER — NITROGLYCERIN 0.4 MG SL SUBL
0.4000 mg | SUBLINGUAL_TABLET | SUBLINGUAL | Status: DC | PRN
  Administered 2021-06-18: 0.4 mg via SUBLINGUAL
  Filled 2021-06-18: qty 1

## 2021-06-18 MED ORDER — PANTOPRAZOLE SODIUM 40 MG PO TBEC
40.0000 mg | DELAYED_RELEASE_TABLET | Freq: Every day | ORAL | Status: DC
  Administered 2021-06-18 – 2021-06-20 (×3): 40 mg via ORAL
  Filled 2021-06-18 (×4): qty 1

## 2021-06-18 MED ORDER — MAGNESIUM SULFATE 2 GM/50ML IV SOLN
2.0000 g | Freq: Once | INTRAVENOUS | Status: AC
  Administered 2021-06-18: 2 g via INTRAVENOUS
  Filled 2021-06-18: qty 50

## 2021-06-18 NOTE — Consult Note (Signed)
Palliative Medicine Inpatient Consult Note  Reason for consult:  "chronic pain, polysubstance abuse, HFrEF/ LV thrombus, poor compliance"  HPI:  Per intake H&P --> Patient is a 70 y.o. male with history of HFrEF (EF 25%) LV thrombus, HTN, DM, polysubstance abuse-who presented to the ED on 7/2 with confusion-he was found to have acute toxic encephalopathy and shock (hypotensive) likely due to opiate overdose.  Patient responded well to Narcan and supportive care-he was admitted initially to the [facility] upon stability-was transferred to the [facility] hospitalist service on 7/6.  Dailen was last seen by the Palliative Care service on 05/14/2021 for further conversations regarding goals of care in the setting of advanced heart failure.  Clinical Assessment/Goals of Care:  *Please note that this is a verbal dictation therefore any spelling or grammatical errors are due to the "[place] [employer]" system interpretation.  I have reviewed medical records including EPIC notes, labs and imaging, received report from bedside RN, assessed the patient who was sitting in bed in NAD    I met with Roe Coombs to further discuss diagnosis prognosis, GOC, EOL wishes, disposition and options.   I introduced Palliative Medicine as specialized medical care for people living with serious illness. It focuses on providing relief from the symptoms and stress of a serious illness. The goal is to improve quality of life for both the patient and the family.  A brief review of PMH was completed inclusive of severe heart failure, diabetes, and poly-substance abuse. We discussed the stages of heart failure and how this is chronic disease which worsens overtime.   Cayetano shares that he is from [place], [place]. He moved to [place] thirty-seven years ago. He is married though is separated from his spouse due to infidelity. He has two step sons and one step daughter. He shares that he has one biological child who he is  not in contact with. He use to own a painting business. He likes being "out in the woods". He is a man of faith and has a personal relationship with God.  Prior to admission Robby was on disability living with his friend, Jeoffrey Massed. He was able to do all things for himself.  A detailed discussion was had today regarding advanced directives - Albion has never completed these before though he is clear that he would like to do these while an inpatient. We reviewed and completed advance directives - I will request our chaplain formally completes these with our notary. Huy states that his surrogate decisions maker will be his friend, Jeoffrey Massed.    Concepts specific to code status, artifical feeding and hydration, continued IV antibiotics and rehospitalization was had.  I shared with Zacharey that in his case with such a poorly functioning heart at baseline I would worry about putting him through CPR as I do not feel it would be ultimately of great benefit and may cause is body more injury than benefit. I introduced a MOST form and we were able to complete this with one another as below"  Cardiopulmonary Resuscitation: Do Not Attempt Resuscitation (DNR/No CPR)  Medical Interventions: Limited Additional Interventions: Use medical treatment, IV fluids and cardiac monitoring as indicated, DO NOT USE intubation or mechanical ventilation. May consider use of less invasive airway support such as BiPAP or CPAP. Also provide comfort measures. Transfer to the hospital if indicated. Avoid intensive care.   Antibiotics: Antibiotics if indicated  IV Fluids: IV fluids if indicated  Feeding Tube: No feeding tube   The  difference between a aggressive medical intervention path  and a palliative comfort care path for this patient at this time was had. I shared that if Damyon continues to cycle in and out of the hospital and remains to be noncompliant it would be reasonable in the setting of his advance heart failure to  then consider hospice care.  I described hospice as a service for patients for have a life expectancy of < 6 months. It preserves dignity and quality at the end phases of life. The focus changes from curative to symptom relief. He understood this.  Discussed the importance of continued conversation with family and their  medical providers regarding overall plan of care and treatment options, ensuring decisions are within the context of the patients values and GOCs.  Provided "Hard Choices for Loving People" booklet.  _______________________________________________________ Addendum:  I called patients friend who he considers a daughter, Jeoffrey Massed. I informed her of the above conversations. I recommended that we all meet in person to review the severity of Kayde's heart failure as well as his goals moving forward. [name] states that she would like to meet tomorrow though cannot commit to a time right now. I shared that I will request one of my team members reach out to her tomorrow to set up a meeting time.   Decision Maker: Jeoffrey Massed (Friend) [phone]  SUMMARY OF RECOMMENDATIONS   DNAR/DNI  MOST Completed, paper copy placed onto the chart electric copy can be found in [facility]  DNR Form Completed, paper copy placed onto the chart electric copy can be found in [facility]  Treat what is treatable  Appreciate Chaplain to help with advance directives --> completed and placed on front of chart  Plan for meet with patients friend, Jeoffrey Massed tomorrow. Time is not determined presently  Broached the topic of hospice for consideration moving forward  Will arrange for OP Palliative support on discharge  Ongoing PMT conversations in the oncoming days  Code Status/Advance Care Planning: DNAR/DNI   Palliative Prophylaxis:  Oral Care, Mobility  Additional Recommendations (Limitations, Scope, Preferences): Continue current scope of care   Psycho-social/Spiritual:  Desire for further  Chaplaincy support: No Additional Recommendations: Education on stages of heart failure   Prognosis: Recurrent readmissions, noncompliance, multiple co-morbidities. High 12 month mortality risk.   Discharge Planning: Discharge to home.   Vitals:   06/18/21 0051 06/18/21 0417  BP: 120/72 (!) 138/92  Pulse: 60 60  Resp: 16 17  Temp: 98.2 F (36.8 C) 98.2 F (36.8 C)  SpO2: 94% 97%    Intake/Output Summary (Last 24 hours) at 06/18/2021 0703 Last data filed at 06/18/2021 0421 Gross per 24 hour  Intake 480 ml  Output --  Net 480 ml   Last Weight  Most recent update: 06/18/2021  4:18 AM    Weight  73.2 kg (161 lb 6.4 oz)            Gen:  Older Caucasian M in NAD HEENT: moist mucous membranes CV: Atrial pacing PULM: On RA ABD: soft/nontender  EXT: No edema  Neuro: Alert and oriented x3, hard of hearing  PPS: 50%   This conversation/these recommendations were discussed with patient primary care team, Dr. Sloan Leiter  Time In: 1530 Time Out: 1700 Total Time: 90 Greater than 50%  of this time was spent counseling and coordinating care related to the above assessment and plan.  [facility] Palliative Medicine Team Team Cell Phone: [phone] Please utilize secure chat with additional questions,  if there is no response within 30 minutes please call the above phone number  Palliative Medicine Team providers are available by phone from 7am to 7pm daily and can be reached through the team cell phone.  Should this patient require assistance outside of these hours, please call the patient's attending physician.

## 2021-06-18 NOTE — Progress Notes (Addendum)
Contacted by tele. Pt had 12 beat run of v tach. Dr. Jerral Ralph contacted and advised.

## 2021-06-18 NOTE — Progress Notes (Signed)
Pt complaining of chest pain, 8 out of 10. BP 127/82, heart rate 113. EKG performed. ST changes seen in lead one. Page Dr .Jerral Ralph. Spoke to attending and I was advised to administer nitro. Advised attending that blood pressure is 127/82 before nitro. Nitro given at 1427.

## 2021-06-18 NOTE — Progress Notes (Addendum)
PROGRESS NOTE        PATIENT DETAILS Name: Roger Mooney Age: 70 y.o. Sex: male Date of Birth: [birth year] Admit Date: 06/14/2021 Admitting Physician Jolyn Nap, MD PCP:Pcp, No  Brief Narrative: Patient is a 70 y.o. male with history of HFrEF (EF 25%) LV thrombus, HTN, DM, polysubstance abuse-who presented to the ED on 7/2 with confusion-he was found to have acute toxic encephalopathy and shock (hypotensive) likely due to opiate overdose.  Patient responded well to Narcan and supportive care-he was admitted initially to the ICU-and upon stability-was transferred to the [facility] on 7/6.  Significant events: 7/2>> admit to ICU-acute toxic encephalopathy/hypotension due to narcotic use. 7/3>> off Narcan infusion 7/4>> off pressors 7/6>> transfer to [facility]  Significant studies: 7/2>> UDS: Positive for cannabinoids/opiates 7/2>> CT head: No acute intracranial abnormalities. 7/2>> CXR: No overt edema-no overt PNA. 7/3>> TTE: EF 20-25%, large apical thrombus  Antimicrobial therapy: Cefepime: 7/2 x 1 Zosyn: 7/2 x 1 Vancomycin: 7/2 x 1  Microbiology data: 7/2>> blood culture: Negative 7/2>> COVID/influenza PCR: Negative  Procedures : None  Consults: PCCM, palliative care  DVT Prophylaxis : SCDs Start: 06/14/21 2248 apixaban (ELIQUIS) tablet 5 mg    Subjective: Sitting at bedside chair-somewhat confused at times but redirectable-no family at bedside to collaborate if this is his baseline.  Acknowledges that he took some extra pills/narcotics before he came to the hospital.   Assessment/Plan: Acute toxic encephalopathy-shock due to narcotic overdose: Resolved.  Initially in the ICU with Narcan infusion and norepinephrine infusion.  He does have some mild confusion-not sure if he has some underlying cognitive dysfunction at baseline-no family at bedside.  AKI: Hemodynamically mediated-in the setting of hypotension-creatinine has  improved-follow closely.  HFrEF-s/p ICD: Euvolemic-BP soft-we will slowly reinitiate GDMT medications over the next few days-currently on low-dose Lasix.Start low dose midodrine to support BP  History of LV thrombus: Per prior notes-not compliant-diagnosed April 2022-previously on Coumadin.  Continue Eliquis for now-doubt he will be compliant to Coumadin in the future.  CAD-history of prior PCI: No anginal symptoms-not on antiplatelet agents-as on anticoagulation  ?  Cognitive dysfunction: Unclear baseline-he is apparently hard of hearing as well-follow closely-await arrival of family/friends to provide collaborative history.  He is somewhat confused but is easily redirectable.  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  History of polysubstance abuse: Will need counseling prior to discharge.  Palliative care: Unfortunate 70 year old with significant comorbidities as noted above-ongoing substance use-numerous recent hospitalizations-some concern for underlying cognitive dysfunction-he is notoriously noncompliant to medications-overall long-term prognosis is poor.  Palliative care following.  Remains a full code.  Diet: Diet Order             Diet Heart Room service appropriate? Yes; Fluid consistency: Thin; Fluid restriction: 1800 mL Fluid  Diet effective now                    Code Status: Full code   Family Communication: None at bedside-we will reach out to family over the next few days-palliative care plan to discharge today.  Disposition Plan: Status is: Inpatient  Remains inpatient  appropriate because:Inpatient level of care appropriate due to severity of illness  Dispo: The patient is from: Home               Anticipated d/c is to:  TBD              Patient currently is not medically stable to d/c.   Difficult to place patient No   Barriers to Discharge: Resolving AKI-concern for cognitive dysfunction-needs inpatient titration of CHF medications-awaiting PT/OT eval to determine appropriate disposition as well.  Antimicrobial agents: Anti-infectives (From admission, onward)    Start     Dose/Rate Route Frequency Ordered Stop   06/14/21 2345  vancomycin (VANCOREADY) IVPB 1500 mg/300 mL        1,500 mg 150 mL/hr over 120 Minutes Intravenous  Once 06/14/21 2256 06/15/21 0229   06/14/21 2345  ceFEPIme (MAXIPIME) 2 g in sodium chloride 0.9 % 100 mL IVPB  Status:  Discontinued        2 g 200 mL/hr over 30 Minutes Intravenous Daily at bedtime 06/14/21 2256 06/15/21 0901   06/14/21 2300  vancomycin variable dose per unstable renal function (pharmacist dosing)  Status:  Discontinued         Does not apply See admin instructions 06/14/21 2300 06/15/21 0901   06/14/21 1530  piperacillin-tazobactam (ZOSYN) IVPB 3.375 g        3.375 g 100 mL/hr over 30 Minutes Intravenous  Once 06/14/21 1529 06/14/21 1700        Time spent: 35 minutes-Greater than 50% of this time was spent in counseling, explanation of diagnosis, planning of further management, and coordination of care.  MEDICATIONS: Scheduled Meds:  apixaban  5 mg Oral BID   atorvastatin  40 mg Oral Daily   chlorhexidine  15 mL Mouth Rinse BID   Chlorhexidine Gluconate Cloth  6 each Topical Daily   furosemide  20 mg Oral Daily   insulin aspart  0-6 Units Subcutaneous Q4H   isosorbide mononitrate  30 mg Oral Daily   lidocaine  1 patch Transdermal Q24H   mouth rinse  15 mL Mouth Rinse q12n4p   sodium chloride flush  10-40 mL Intracatheter Q12H   Continuous Infusions: PRN Meds:.acetaminophen, docusate sodium, naLOXone (NARCAN)  injection, polyethylene glycol, sodium chloride flush, traMADol   PHYSICAL EXAM: Vital signs: Vitals:    06/18/21 0051 06/18/21 0417 06/18/21 0756 06/18/21 1130  BP: 120/72 (!) 138/92 111/76 122/85  Pulse: 60 60 63 61  Resp: 16 17 18 18   Temp: 98.2 F (36.8 C) 98.2 F (36.8 C) 98.1 F (36.7 C) 98 F (36.7 C)  TempSrc: Oral Oral Oral Oral  SpO2: 94% 97% 96% 98%  Weight:  73.2 kg    Height:       Filed Weights   06/16/21 0500 06/17/21 0500 06/18/21 0417  Weight: 76.1 kg 76.6 kg 73.2 kg   Body mass index is 22.51 kg/m.   Gen Exam: Mildly confused-but not in any distress. HEENT:atraumatic, normocephalic Chest: B/L clear to auscultation anteriorly CVS:S1S2 regular Abdomen:soft non tender, non distended Extremities:no edema Neurology: Non focal Skin: no rash  I have personally reviewed following labs and imaging studies  LABORATORY DATA: CBC: Recent Labs  Lab 06/14/21 1550 06/14/21 2247 06/14/21 2253 06/15/21 0040 06/15/21 0343 06/15/21 0510 06/18/21 0414  WBC 17.2* 18.8*  --   --  17.5*  --  11.8*  NEUTROABS 15.3* 15.3*  --   --   --   --   --  HGB 12.6* 12.4* 12.6* 12.6* 12.7* 12.9* 14.0  HCT 41.3 40.4 37.0* 37.0* 39.8 38.0* 43.6  MCV 88.4 88.0  --   --  87.1  --  83.5  PLT 217 237  --   --  242  --  302    Basic Metabolic Panel: Recent Labs  Lab 06/14/21 2247 06/14/21 2253 06/15/21 0343 06/15/21 0510 06/16/21 0051 06/17/21 0030 06/18/21 0414  NA 138   < > 139 139 139 136 136  K 5.1   < > 4.9 5.1 4.0 4.3 4.1  CL 109  --  108  --  108 106 103  CO2 18*  --  16*  --  22 22 23   GLUCOSE 112*  --  137*  --  132* 123* 94  BUN 56*  --  52*  --  40* 26* 13  CREATININE 6.32*  --  5.18*  --  2.62* 1.31* 1.18  CALCIUM 7.5*  --  8.2*  --  8.5* 8.6* 9.2  MG 1.5*  --  1.9  --  1.2* 1.5* 1.7  PHOS 6.9*  --  5.2*  --   --   --   --    < > = values in this interval not displayed.    GFR: Estimated Creatinine Clearance: 61.2 mL/min (by C-G formula based on SCr of 1.18 mg/dL).  Liver Function Tests: Recent Labs  Lab 06/14/21 1550 06/14/21 2247 06/16/21 0051  06/17/21 0030  AST 27 29 22 20   ALT 15 16 14 14   ALKPHOS 52 46 48 41  BILITOT 1.7* 1.7* 2.1* 1.4*  PROT 7.0 6.2* 6.0* 6.0*  ALBUMIN 3.5 3.1* 2.9* 3.0*   No results for input(s): LIPASE, AMYLASE in the last 168 hours. Recent Labs  Lab 06/14/21 1550  AMMONIA 16    Coagulation Profile: Recent Labs  Lab 06/14/21 2247 06/15/21 1135  INR 1.2 1.2    Cardiac Enzymes: Recent Labs  Lab 06/15/21 0100  CKTOTAL 821*    BNP (last 3 results) No results for input(s): PROBNP in the last 8760 hours.  Lipid Profile: No results for input(s): CHOL, HDL, LDLCALC, TRIG, CHOLHDL, LDLDIRECT in the last 72 hours.  Thyroid Function Tests: No results for input(s): TSH, T4TOTAL, FREET4, T3FREE, THYROIDAB in the last 72 hours.  Anemia Panel: No results for input(s): VITAMINB12, FOLATE, FERRITIN, TIBC, IRON, RETICCTPCT in the last 72 hours.  Urine analysis:    Component Value Date/Time   COLORURINE AMBER (A) 06/14/2021 1722   APPEARANCEUR HAZY (A) 06/14/2021 1722   LABSPEC 1.026 06/14/2021 1722   PHURINE 5.0 06/14/2021 1722   GLUCOSEU NEGATIVE 06/14/2021 1722   HGBUR NEGATIVE 06/14/2021 1722   BILIRUBINUR NEGATIVE 06/14/2021 1722   KETONESUR 5 (A) 06/14/2021 1722   PROTEINUR 30 (A) 06/14/2021 1722   NITRITE NEGATIVE 06/14/2021 1722   LEUKOCYTESUR MODERATE (A) 06/14/2021 1722    Sepsis Labs: Lactic Acid, Venous    Component Value Date/Time   LATICACIDVEN 0.6 06/14/2021 2247    MICROBIOLOGY: Recent Results (from the past 240 hour(s))  Resp Panel by RT-PCR (Flu A&B, Covid) Nasopharyngeal Swab     Status: None   Collection Time: 06/14/21  3:22 PM   Specimen: Nasopharyngeal Swab; Nasopharyngeal(NP) swabs in vial transport medium  Result Value Ref Range Status   SARS Coronavirus 2 by RT PCR NEGATIVE NEGATIVE Final    Comment: (NOTE) SARS-CoV-2 target nucleic acids are NOT DETECTED.  The SARS-CoV-2 RNA is generally detectable in upper respiratory specimens during the acute  phase of infection. The lowest concentration of SARS-CoV-2 viral copies this assay can detect is 138 copies/mL. A negative result does not preclude SARS-Cov-2 infection and should not be used as the sole basis for treatment or other patient management decisions. A negative result may occur with  improper specimen collection/handling, submission of specimen other than nasopharyngeal swab, presence of viral mutation(s) within the areas targeted by this assay, and inadequate number of viral copies(<138 copies/mL). A negative result must be combined with clinical observations, patient history, and epidemiological information. The expected result is Negative.  Fact Sheet for Patients:  BloggerCourse.com  Fact Sheet for Healthcare Providers:  SeriousBroker.it  This test is no t yet approved or cleared by the [place] FDA and  has been authorized for detection and/or diagnosis of SARS-CoV-2 by FDA under an Emergency Use Authorization (EUA). This EUA will remain  in effect (meaning this test can be used) for the duration of the COVID-19 declaration under Section 564(b)(1) of the Act, 21 U.S.C.section 360bbb-3(b)(1), unless the authorization is terminated  or revoked sooner.       Influenza A by PCR NEGATIVE NEGATIVE Final   Influenza B by PCR NEGATIVE NEGATIVE Final    Comment: (NOTE) The Xpert Xpress SARS-CoV-2/FLU/RSV plus assay is intended as an aid in the diagnosis of influenza from Nasopharyngeal swab specimens and should not be used as a sole basis for treatment. Nasal washings and aspirates are unacceptable for Xpert Xpress SARS-CoV-2/FLU/RSV testing.  Fact Sheet for Patients: BloggerCourse.com  Fact Sheet for Healthcare Providers: SeriousBroker.it  This test is not yet approved or cleared by the [place] FDA and has been authorized for detection and/or diagnosis of  SARS-CoV-2 by FDA under an Emergency Use Authorization (EUA). This EUA will remain in effect (meaning this test can be used) for the duration of the COVID-19 declaration under Section 564(b)(1) of the Act, 21 U.S.C. section 360bbb-3(b)(1), unless the authorization is terminated or revoked.  Performed at [facility], [address] [place], [place] [address]   Culture, blood (routine x 2)     Status: None (Preliminary result)   Collection Time: 06/14/21  3:50 PM   Specimen: Left Antecubital; Blood  Result Value Ref Range Status   Specimen Description   Final    LEFT ANTECUBITAL BOTTLES DRAWN AEROBIC AND ANAEROBIC   Special Requests Blood Culture adequate volume  Final   Culture   Final    NO GROWTH 4 DAYS Performed at [facility], [address] [place], [place] [address]    Report Status PENDING  Incomplete  Culture, blood (routine x 2)     Status: None (Preliminary result)   Collection Time: 06/14/21  4:05 PM   Specimen: Right Antecubital; Blood  Result Value Ref Range Status   Specimen Description   Final    RIGHT ANTECUBITAL BOTTLES DRAWN AEROBIC AND ANAEROBIC   Special Requests Blood Culture adequate volume  Final   Culture   Final    NO GROWTH 4 DAYS Performed at [facility], [address], [place], [place] [address]    Report Status PENDING  Incomplete  MRSA Next Gen by PCR, Nasal     Status: None   Collection Time: 06/14/21 10:40 PM   Specimen: Nasal Mucosa; Nasal Swab  Result Value Ref Range Status   MRSA by PCR Next Gen NOT DETECTED NOT DETECTED Final    Comment: (NOTE) The GeneXpert MRSA Assay (FDA approved for NASAL specimens only), is one component of a comprehensive MRSA colonization  surveillance program. It is not intended to diagnose MRSA infection nor to guide or monitor treatment for MRSA infections. Test performance is not FDA approved in patients less than 66 years old. Performed at [facility], [address], [place], [place] [address]      RADIOLOGY STUDIES/RESULTS: No results found.   LOS: 4 days   Jeoffrey Massed, MD  [facility]    To contact the attending provider between 7A-7P or the covering provider during after hours 7P-7A, please log into the web site [email or web address] and access using universal [place] password for that web site. If you do not have the password, please call the hospital operator.  06/18/2021, 1:01 PM

## 2021-06-18 NOTE — Evaluation (Signed)
Physical Therapy Evaluation Patient Details Name: Roger Mooney MRN: [id number] DOB: 1951/01/10 Today's Date: 06/18/2021   History of Present Illness  Pt adm 7/2 with AMS. Pt found to be in shock likely secondary to narcotic overdose. Pt responded well to Narcan. Pt also with ongoing left ventricle thrombus. PMH - polysubstance abuse, chf, pacer/ICD, CAD, DM  Clinical Impression  Pt doing well with mobility and no further PT needed.  May need supervision at DC for cognition.      Follow Up Recommendations No PT follow up    Equipment Recommendations  None recommended by PT    Recommendations for Other Services       Precautions / Restrictions Precautions Precautions: None      Mobility  Bed Mobility Overal bed mobility: Independent                  Transfers Overall transfer level: Independent Equipment used: None                Ambulation/Gait Ambulation/Gait assistance: Independent Gait Distance (Feet): 450 Feet Assistive device: None Gait Pattern/deviations: WFL(Within Functional Limits) Gait velocity: adequate Gait velocity interpretation: >2.62 ft/sec, indicative of community ambulatory General Gait Details: Steady gait. Able to perform head turns, speed changes, and turns without difficulty  Stairs            Wheelchair Mobility    Modified Rankin (Stroke Patients Only)       Balance Overall balance assessment: Independent                           High level balance activites: Direction changes;Turns;Head turns High Level Balance Comments: Steady with the above and also picking up objects from floor and stepping over objects.             Pertinent Vitals/Pain Pain Assessment: Faces Faces Pain Scale: Hurts a little bit Pain Location: rt hip Pain Descriptors / Indicators: Grimacing;Guarding Pain Intervention(s): Limited activity within patient's tolerance;Monitored during session    Home Living Family/patient  expects to be discharged to:: Private residence                 Additional Comments: Pt unable to describe his home and couldn't remember where it was.    Prior Function Level of Independence: Independent               Hand Dominance        Extremity/Trunk Assessment   Upper Extremity Assessment Upper Extremity Assessment: Defer to OT evaluation    Lower Extremity Assessment Lower Extremity Assessment: Overall WFL for tasks assessed       Communication   Communication: HOH  Cognition Arousal/Alertness: Awake/alert Behavior During Therapy: Flat affect Overall Cognitive Status: No family/caregiver present to determine baseline cognitive functioning                                 General Comments: Pt following 1 step commands with incr time. Also HOH so some delay may be due to that. Pt couldn't state where he lived or why he was here.      General Comments      Exercises     Assessment/Plan    PT Assessment Patent does not need any further PT services  PT Problem List         PT Treatment Interventions      PT Goals (Current goals  can be found in the Care Plan section)  Acute Rehab PT Goals PT Goal Formulation: All assessment and education complete, DC therapy    Frequency     Barriers to discharge        Co-evaluation               AM-PAC PT "6 Clicks" Mobility  Outcome Measure Help needed turning from your back to your side while in a flat bed without using bedrails?: None Help needed moving from lying on your back to sitting on the side of a flat bed without using bedrails?: None Help needed moving to and from a bed to a chair (including a wheelchair)?: None Help needed standing up from a chair using your arms (e.g., wheelchair or bedside chair)?: None Help needed to walk in hospital room?: None Help needed climbing 3-5 steps with a railing? : None 6 Click Score: 24    End of Session   Activity Tolerance:  Patient tolerated treatment well Patient left: in bed;with call bell/phone within reach Nurse Communication: Mobility status PT Visit Diagnosis: Other abnormalities of gait and mobility (R26.89)    Time: 3875-6433 PT Time Calculation (min) (ACUTE ONLY): 11 min   Charges:   PT Evaluation $PT Eval Low Complexity: 1 Low          [facility] PT Acute Rehabilitation Services Pager [phone] Office [phone]   Angelina [facility] 06/18/2021, 1:41 PM

## 2021-06-19 DIAGNOSIS — I5020 Unspecified systolic (congestive) heart failure: Secondary | ICD-10-CM

## 2021-06-19 DIAGNOSIS — N179 Acute kidney failure, unspecified: Secondary | ICD-10-CM

## 2021-06-19 DIAGNOSIS — T40604S Poisoning by unspecified narcotics, undetermined, sequela: Secondary | ICD-10-CM

## 2021-06-19 LAB — CULTURE, BLOOD (ROUTINE X 2)
Culture: NO GROWTH
Culture: NO GROWTH
Special Requests: ADEQUATE
Special Requests: ADEQUATE

## 2021-06-19 LAB — GLUCOSE, CAPILLARY
Glucose-Capillary: 175 mg/dL — ABNORMAL HIGH (ref 70–99)
Glucose-Capillary: 130 mg/dL — ABNORMAL HIGH (ref 70–99)
Glucose-Capillary: 87 mg/dL (ref 70–99)
Glucose-Capillary: 93 mg/dL (ref 70–99)
Glucose-Capillary: 87 mg/dL (ref 70–99)
Glucose-Capillary: 138 mg/dL — ABNORMAL HIGH (ref 70–99)
Glucose-Capillary: 129 mg/dL — ABNORMAL HIGH (ref 70–99)
Glucose-Capillary: 111 mg/dL — ABNORMAL HIGH (ref 70–99)

## 2021-06-19 LAB — BASIC METABOLIC PANEL
Anion gap: 8 (ref 5–15)
BUN: 10 mg/dL (ref 8–23)
CO2: 23 mmol/L (ref 22–32)
Calcium: 8.8 mg/dL — ABNORMAL LOW (ref 8.9–10.3)
Chloride: 104 mmol/L (ref 98–111)
Creatinine, Ser: 1.14 mg/dL (ref 0.61–1.24)
GFR, Estimated: >60 mL/min (ref >60)
Glucose, Bld: 92 mg/dL (ref 70–99)
Potassium: 4.2 mmol/L (ref 3.5–5.1)
Sodium: 135 mmol/L (ref 135–145)

## 2021-06-19 LAB — MAGNESIUM: Magnesium: 1.6 mg/dL — ABNORMAL LOW (ref 1.7–2.4)

## 2021-06-19 MED ORDER — MAGNESIUM SULFATE 4 GM/100ML IV SOLN
4.0000 g | Freq: Once | INTRAVENOUS | Status: AC
  Administered 2021-06-19: 4 g via INTRAVENOUS
  Filled 2021-06-19: qty 100

## 2021-06-19 MED ORDER — CARVEDILOL 3.125 MG PO TABS
3.1250 mg | ORAL_TABLET | Freq: Two times a day (BID) | ORAL | Status: DC
  Administered 2021-06-19 (×2): 3.125 mg via ORAL
  Filled 2021-06-19 (×3): qty 1

## 2021-06-19 NOTE — Progress Notes (Signed)
CSW received consult for substance use resources. CSW provided patient with outpatient substance use treatment services resources. Patient accepted.

## 2021-06-19 NOTE — Progress Notes (Addendum)
PROGRESS NOTE        PATIENT DETAILS Name: Roger Mooney Age: 70 y.o. Sex: male Date of Birth: Jun 09, 1951 Admit Date: 06/14/2021 Admitting Physician Jolyn Nap, MD PCP:Pcp, No  Brief Narrative: Patient is a 70 y.o. male with history of HFrEF (EF 25%) LV thrombus, HTN, DM, polysubstance abuse-who presented to the ED on 7/2 with confusion-he was found to have acute toxic encephalopathy and shock (hypotensive) likely due to opiate overdose.  Patient responded well to Narcan and supportive care-he was admitted initially to the ICU-and upon stability-was [facility] on 7/6.  Significant events: 7/2>> admit to ICU-acute toxic encephalopathy/hypotension due to narcotic use. 7/3>> off Narcan infusion 7/4>> off pressors 7/6>> transfer to [facility]  Significant studies: 7/2>> UDS: Positive for cannabinoids/opiates 7/2>> CT head: No acute intracranial abnormalities. 7/2>> CXR: No overt edema-no overt PNA. 7/3>> TTE: EF 20-25%, large apical thrombus  Antimicrobial therapy: Cefepime: 7/2 x 1 Zosyn: 7/2 x 1 Vancomycin: 7/2 x 1  Microbiology data: 7/2>> blood culture: Negative 7/2>> COVID/influenza PCR: Negative  Procedures : None  Consults: PCCM, palliative care  DVT Prophylaxis : SCDs Start: 06/14/21 2248 apixaban (ELIQUIS) tablet 5 mg    Subjective: Completely awake and alert this morning.  No major issues overnight.  He is hard of hearing.   Assessment/Plan: Acute toxic encephalopathy-shock due to narcotic overdose (unintentional): Resolved-he is completely awake and alert.  BP soft but stable.  He was initially admitted to the ICU and required Narcan and norepinephrine infusion.    AKI: Hemodynamically mediated-in the setting of hypotension-creatinine has now normalized.  HFrEF-s/p ICD: Euvolemic-BP soft-have started midodrine-start Coreg-tolerating low-dose Lasix.  Given noncompliance/severity of AKI-May be  better to hold off on starting ACEI/Entresto.  History of LV thrombus: Diagnosed April 2022-Per prior notes-noncompliant with Coumadin-now on Eliquis.  Needs outpatient follow-up with cardiology.  CAD-history of prior PCI: No anginal symptoms-not on antiplatelet agents-as on anticoagulation  ?  Cognitive dysfunction: On 7/6-thought to be somewhat confused-however it now appears that he has significant hearing disability-this morning-when this MD spoke to him with a loud voice-he was able to answer all questions appropriately.    Hypomagnesemia: Continue to replete and recheck.  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  History of polysubstance abuse: Will need counseling prior to discharge.  Palliative care: Unfortunate 70 year old with significant comorbidities as noted above-ongoing substance use-noncompliance-numerous recent hospitalizations-overall long-term prognosis is poor-palliative care following-now a DNR-family meeting scheduled for 7/7.  Will await further recommendations.    Diet: Diet Order             Diet Heart Room service appropriate? Yes; Fluid consistency: Thin; Fluid restriction: 1800 mL Fluid  Diet effective now                    Code Status: Full code   Family Communication: None at bedside-palliative care meeting with family today.  Disposition Plan: Status is: Inpatient  Remains inpatient appropriate because:Inpatient level of care appropriate due to severity of illness  Dispo: The patient is from: Home  Anticipated d/c is to:  TBD              Patient currently is not medically stable to d/c.   Difficult to place patient No   Barriers to Discharge: Resolving AKI-concern for cognitive dysfunction-needs  inpatient titration of CHF medications-awaiting PT/OT eval to determine appropriate disposition as well.  Antimicrobial agents: Anti-infectives (From admission, onward)    Start     Dose/Rate Route Frequency Ordered Stop   06/14/21 2345  vancomycin (VANCOREADY) IVPB 1500 mg/300 mL        1,500 mg 150 mL/hr over 120 Minutes Intravenous  Once 06/14/21 2256 06/15/21 0229   06/14/21 2345  ceFEPIme (MAXIPIME) 2 g in sodium chloride 0.9 % 100 mL IVPB  Status:  Discontinued        2 g 200 mL/hr over 30 Minutes Intravenous Daily at bedtime 06/14/21 2256 06/15/21 0901   06/14/21 2300  vancomycin variable dose per unstable renal function (pharmacist dosing)  Status:  Discontinued         Does not apply See admin instructions 06/14/21 2300 06/15/21 0901   06/14/21 1530  piperacillin-tazobactam (ZOSYN) IVPB 3.375 g        3.375 g 100 mL/hr over 30 Minutes Intravenous  Once 06/14/21 1529 06/14/21 1700        Time spent: 25 minutes-Greater than 50% of this time was spent in counseling, explanation of diagnosis, planning of further management, and coordination of care.  MEDICATIONS: Scheduled Meds:  apixaban  5 mg Oral BID   atorvastatin  40 mg Oral Daily   carvedilol  3.125 mg Oral BID WC   chlorhexidine  15 mL Mouth Rinse BID   Chlorhexidine Gluconate Cloth  6 each Topical Daily   furosemide  20 mg Oral Daily   insulin aspart  0-6 Units Subcutaneous Q4H   isosorbide mononitrate  30 mg Oral Daily   lidocaine  1 patch Transdermal Q24H   mouth rinse  15 mL Mouth Rinse q12n4p   midodrine  5 mg Oral TID WC   pantoprazole  40 mg Oral Q1200   sodium chloride flush  10-40 mL Intracatheter Q12H   Continuous Infusions: PRN Meds:.acetaminophen, alum & mag hydroxide-simeth, docusate sodium, naLOXone (NARCAN)  injection, nitroGLYCERIN, polyethylene glycol, sodium chloride flush, traMADol   PHYSICAL EXAM: Vital signs: Vitals:   06/19/21 0452 06/19/21 0742 06/19/21 0811 06/19/21 1100  BP:  (!)  89/66 138/90 (!) 84/68  Pulse:   62 71  Resp:   12 14  Temp:   98.6 F (37 C) 98.5 F (36.9 C)  TempSrc:   Oral Oral  SpO2:      Weight: 72.6 kg     Height:       Filed Weights   06/17/21 0500 06/18/21 0417 06/19/21 0452  Weight: 76.6 kg 73.2 kg 72.6 kg   Body mass index is 22.33 kg/m.   Gen Exam:Alert awake-not in any distress HEENT:atraumatic, normocephalic Chest: B/L clear to auscultation anteriorly CVS:S1S2 regular Abdomen:soft non tender, non distended Extremities:no edema Neurology: Non focal Skin: no rash   I have personally reviewed following labs and imaging studies  LABORATORY DATA: CBC: Recent Labs  Lab 06/14/21 1550 06/14/21 2247 06/14/21 2253 06/15/21 0040 06/15/21 0343 06/15/21 0510 06/18/21 0414  WBC 17.2* 18.8*  --   --  17.5*  --  11.8*  NEUTROABS 15.3* 15.3*  --   --   --   --   --   HGB 12.6* 12.4* 12.6* 12.6* 12.7*  12.9* 14.0  HCT 41.3 40.4 37.0* 37.0* 39.8 38.0* 43.6  MCV 88.4 88.0  --   --  87.1  --  83.5  PLT 217 237  --   --  242  --  302     Basic Metabolic Panel: Recent Labs  Lab 06/14/21 2247 06/14/21 2253 06/15/21 0343 06/15/21 0510 06/16/21 0051 06/17/21 0030 06/18/21 0414 06/19/21 0511  NA 138   < > 139 139 139 136 136 135  K 5.1   < > 4.9 5.1 4.0 4.3 4.1 4.2  CL 109  --  108  --  108 106 103 104  CO2 18*  --  16*  --  22 22 23 23   GLUCOSE 112*  --  137*  --  132* 123* 94 92  BUN 56*  --  52*  --  40* 26* 13 10  CREATININE 6.32*  --  5.18*  --  2.62* 1.31* 1.18 1.14  CALCIUM 7.5*  --  8.2*  --  8.5* 8.6* 9.2 8.8*  MG 1.5*  --  1.9  --  1.2* 1.5* 1.7 1.6*  PHOS 6.9*  --  5.2*  --   --   --   --   --    < > = values in this interval not displayed.     GFR: Estimated Creatinine Clearance: 62.8 mL/min (by C-G formula based on SCr of 1.14 mg/dL).  Liver Function Tests: Recent Labs  Lab 06/14/21 1550 06/14/21 2247 06/16/21 0051 06/17/21 0030  AST 27 29 22 20   ALT 15 16 14 14   ALKPHOS 52 46 48 41  BILITOT  1.7* 1.7* 2.1* 1.4*  PROT 7.0 6.2* 6.0* 6.0*  ALBUMIN 3.5 3.1* 2.9* 3.0*    No results for input(s): LIPASE, AMYLASE in the last 168 hours. Recent Labs  Lab 06/14/21 1550  AMMONIA 16     Coagulation Profile: Recent Labs  Lab 06/14/21 2247 06/15/21 1135  INR 1.2 1.2     Cardiac Enzymes: Recent Labs  Lab 06/15/21 0100  CKTOTAL 821*     BNP (last 3 results) No results for input(s): PROBNP in the last 8760 hours.  Lipid Profile: No results for input(s): CHOL, HDL, LDLCALC, TRIG, CHOLHDL, LDLDIRECT in the last 72 hours.  Thyroid Function Tests: No results for input(s): TSH, T4TOTAL, FREET4, T3FREE, THYROIDAB in the last 72 hours.  Anemia Panel: No results for input(s): VITAMINB12, FOLATE, FERRITIN, TIBC, IRON, RETICCTPCT in the last 72 hours.  Urine analysis:    Component Value Date/Time   COLORURINE AMBER (A) 06/14/2021 1722   APPEARANCEUR HAZY (A) 06/14/2021 1722   LABSPEC 1.026 06/14/2021 1722   PHURINE 5.0 06/14/2021 1722   GLUCOSEU NEGATIVE 06/14/2021 1722   HGBUR NEGATIVE 06/14/2021 1722   BILIRUBINUR NEGATIVE 06/14/2021 1722   KETONESUR 5 (A) 06/14/2021 1722   PROTEINUR 30 (A) 06/14/2021 1722   NITRITE NEGATIVE 06/14/2021 1722   LEUKOCYTESUR MODERATE (A) 06/14/2021 1722    Sepsis Labs: Lactic Acid, Venous    Component Value Date/Time   LATICACIDVEN 0.6 06/14/2021 2247    MICROBIOLOGY: Recent Results (from the past 240 hour(s))  Resp Panel by RT-PCR (Flu A&B, Covid) Nasopharyngeal Swab     Status: None   Collection Time: 06/14/21  3:22 PM   Specimen: Nasopharyngeal Swab; Nasopharyngeal(NP) swabs in vial transport medium  Result Value Ref Range Status   SARS Coronavirus 2 by RT PCR NEGATIVE NEGATIVE Final    Comment: (NOTE) SARS-CoV-2 target nucleic acids are NOT DETECTED.  The SARS-CoV-2 RNA is generally detectable in upper respiratory specimens during the acute phase of infection. The lowest concentration of SARS-CoV-2 viral copies this  assay can detect is 138 copies/mL. A negative result does not preclude SARS-Cov-2 infection and should not be used as the sole basis for treatment or other patient management decisions. A negative result may occur with  improper specimen collection/handling, submission of specimen other than nasopharyngeal swab, presence of viral mutation(s) within the areas targeted by this assay, and inadequate number of viral copies(<138 copies/mL). A negative result must be combined with clinical observations, patient history, and epidemiological information. The expected result is Negative.  Fact Sheet for Patients:  BloggerCourse.com  Fact Sheet for Healthcare Providers:  SeriousBroker.it  This test is no t yet approved or cleared by the [place] FDA and  has been authorized for detection and/or diagnosis of SARS-CoV-2 by FDA under an Emergency Use Authorization (EUA). This EUA will remain  in effect (meaning this test can be used) for the duration of the COVID-19 declaration under Section 564(b)(1) of the Act, 21 U.S.C.section 360bbb-3(b)(1), unless the authorization is terminated  or revoked sooner.       Influenza A by PCR NEGATIVE NEGATIVE Final   Influenza B by PCR NEGATIVE NEGATIVE Final    Comment: (NOTE) The Xpert Xpress SARS-CoV-2/FLU/RSV plus assay is intended as an aid in the diagnosis of influenza from Nasopharyngeal swab specimens and should not be used as a sole basis for treatment. Nasal washings and aspirates are unacceptable for Xpert Xpress SARS-CoV-2/FLU/RSV testing.  Fact Sheet for Patients: BloggerCourse.com  Fact Sheet for Healthcare Providers: SeriousBroker.it  This test is not yet approved or cleared by the [place] FDA and has been authorized for detection and/or diagnosis of SARS-CoV-2 by FDA under an Emergency Use Authorization (EUA). This EUA will  remain in effect (meaning this test can be used) for the duration of the COVID-19 declaration under Section 564(b)(1) of the Act, 21 U.S.C. section 360bbb-3(b)(1), unless the authorization is terminated or revoked.  Performed at [facility], [address], [place], [place] [address]   Culture, blood (routine x 2)     Status: None (Preliminary result)   Collection Time: 06/14/21  3:50 PM   Specimen: Left Antecubital; Blood  Result Value Ref Range Status   Specimen Description   Final    LEFT ANTECUBITAL BOTTLES DRAWN AEROBIC AND ANAEROBIC   Special Requests Blood Culture adequate volume  Final   Culture   Final    NO GROWTH 4 DAYS Performed at [facility], [address], [place], [place] [address]    Report Status PENDING  Incomplete  Culture, blood (routine x 2)     Status: None (Preliminary result)   Collection Time: 06/14/21  4:05 PM   Specimen: Right Antecubital; Blood  Result Value Ref Range Status   Specimen Description   Final    RIGHT ANTECUBITAL BOTTLES DRAWN AEROBIC AND ANAEROBIC   Special Requests Blood Culture adequate volume  Final   Culture   Final    NO GROWTH 4 DAYS Performed at [facility], [address], [place], [place] [address]    Report Status PENDING  Incomplete  MRSA Next Gen by PCR, Nasal     Status: None   Collection Time: 06/14/21 10:40 PM   Specimen: Nasal Mucosa; Nasal Swab  Result Value Ref Range Status   MRSA by PCR Next Gen NOT DETECTED NOT DETECTED Final    Comment: (NOTE) The GeneXpert MRSA Assay (  FDA approved for NASAL specimens only), is one component of a comprehensive MRSA colonization surveillance program. It is not intended to diagnose MRSA infection nor to guide or monitor treatment for MRSA infections. Test performance is not FDA approved in patients less than 61 years old. Performed at [facility], [address], [place], [place] [address]     RADIOLOGY STUDIES/RESULTS: No results found.   LOS: 5 days   Jeoffrey Massed, MD  [facility]    To contact the attending provider between 7A-7P or the covering provider during after hours 7P-7A, please log into the web site [email or web address] and access using universal [place] password for that web site. If you do not have the password, please call the hospital operator.  06/19/2021, 12:13 PM

## 2021-06-19 NOTE — Progress Notes (Signed)
This chaplain is with the Pt., notary, and two witnesses for notarizing the Pt. Advance Directive:  HCPOA and Living Will.  The Pt. named Roger Mooney as his healthcare agent.  The chaplain gave the Pt. the original AD and one copy.  The Pt. AD was scanned into the Pt. EMR.

## 2021-06-19 NOTE — Progress Notes (Signed)
Daily Progress Note   Patient Name: Roger Mooney       Date: 06/19/2021 DOB: [birth year]  Age: 70 y.o. MRN#: [id number] Attending Physician: Jonetta Osgood, MD Primary Care Physician: Pcp, No Admit Date: 06/14/2021 Length of Stay: 5 days  Reason for Consultation/Follow-up: Establishing goals of care and discussion of severity of illness  HPI/Patient Profile:  Patient is a 70 y.o. male with history of HFrEF (EF 25%) LV thrombus, HTN, DM, polysubstance abuse-who presented to the ED on 7/2 with confusion-he was found to have acute toxic encephalopathy and shock (hypotensive) likely due to opiate overdose.  Patient responded well to Narcan and supportive care-he was admitted initially to the [facility] upon stability-was transferred to the [facility] on 7/6.   Roger Mooney was last seen by the Palliative Care service on 05/14/2021 for further conversations regarding goals of care in the setting of advanced heart failure.  Current Medications: Scheduled Meds:  . apixaban  5 mg Oral BID  . atorvastatin  40 mg Oral Daily  . carvedilol  3.125 mg Oral BID WC  . chlorhexidine  15 mL Mouth Rinse BID  . Chlorhexidine Gluconate Cloth  6 each Topical Daily  . furosemide  20 mg Oral Daily  . insulin aspart  0-6 Units Subcutaneous Q4H  . isosorbide mononitrate  30 mg Oral Daily  . lidocaine  1 patch Transdermal Q24H  . mouth rinse  15 mL Mouth Rinse q12n4p  . midodrine  5 mg Oral TID WC  . pantoprazole  40 mg Oral Q1200  . sodium chloride flush  10-40 mL Intracatheter Q12H    Continuous Infusions:   PRN Meds: acetaminophen, alum & mag hydroxide-simeth, docusate sodium, naLOXone (NARCAN)  injection, nitroGLYCERIN, polyethylene glycol, sodium chloride flush, traMADol  Subjective:   Subjective: Chart Reviewed. Updates received. Patient Assessed. Created space and opportunity for patient  and family to explore thoughts and feelings regarding current medical situation.  Today's Discussion:  I met with the patient at the bedside today.  Discussed that I tried to call his surrogate mildly but her voicemail not set up.  He indicated that she indicated she would be here today or tomorrow, but something him today and so more likely tomorrow.  I told him if she comes in just to let us know and we would happily come see him with her in order to further discuss the severity of his heart failure per her request.  Today he states he is doing okay.  No chest pain, shortness of breath.  He did get up with physical therapy and did well with this.  He knows he has a weak heart.  We discussed taking 1 day at a time, discussing where we are to get a good understanding of that in order to know where we can go.  Discussed the importance of quality of life and for care to support his goals.  He verbalized agreement and appreciation.  Review of Systems  Constitutional:  Negative for appetite change.  Respiratory:  Negative for cough, chest tightness and shortness of breath.   Cardiovascular:  Negative for chest pain.  Gastrointestinal:  Negative for abdominal pain, nausea and vomiting.   Objective:   Vital Signs: BP (!) 84/68 (BP Location: Right Arm)   Pulse 71   Temp 98.5 F (36.9 C) (Oral)   Resp 14   Ht 5' 11"  (1.803 m)   Wt 72.6 kg   SpO2 97%   BMI 22.33 kg/m  SpO2: SpO2: 97 %  O2 Device: O2 Device: Room Air O2 Flow Rate: O2 Flow Rate (L/min): 4 L/min  Physical Exam: Physical Exam Vitals and nursing note reviewed.  Constitutional:      General: He is not in acute distress.    Appearance: Normal appearance. He is normal weight. He is not toxic-appearing.  HENT:     Head: Normocephalic and atraumatic.  Cardiovascular:     Rate and Rhythm: Normal rate and regular rhythm.     Pulses: Normal pulses.     Heart sounds: Normal heart sounds.  Pulmonary:     Effort: Pulmonary effort is normal. No respiratory distress.     Breath sounds: Normal breath sounds. No wheezing, rhonchi or rales.   Abdominal:     General: Abdomen is flat.     Palpations: Abdomen is soft.     Tenderness: There is no abdominal tenderness.  Skin:    General: Skin is warm and dry.  Neurological:     General: No focal deficit present.     Mental Status: He is alert and oriented to person, place, and time.  Psychiatric:        Mood and Affect: Mood normal.        Behavior: Behavior normal.    SpO2: SpO2: 97 % O2 Device:SpO2: 97 % O2 Flow Rate: .O2 Flow Rate (L/min): 4 L/min  IO: Intake/output summary:  Intake/Output Summary (Last 24 hours) at 06/19/2021 1608 Last data filed at 06/19/2021 1300 Gross per 24 hour  Intake 250 ml  Output --  Net 250 ml    LBM: Last BM Date: 06/18/21 Baseline Weight: Weight: 76.8 kg Most recent weight: Weight: 72.6 kg   Palliative Assessment/Data: 60%   Assessment & Plan:   Impression: 70 year old male with multiple comorbidities as described in initial consult note and above.  Specific to the request for palliative medicine evaluation is heart failure with reduced EF of 20 to 25%.  We saw the patient and establish goals of care.  However, his surrogate has asked for further discussion related to the severity of his heart failure for her understanding.  We attempted to reach out to her today without success.  She was not at the bedside today.  We will continue to follow and attempt discussion to further goals of care clarifications.  [place] as per MOST form as noted in consult note dated 06/18/2021 (specifically DNR, limited scope, antibiotics and IV fluids if indicated, no feeding tube) Will continue to engage and discussed with the patient and his surrogate (when available) on severity of illness Continue discussions on goals as care and clinical status progresses PNT to continue holistic support Plan for outpatient palliative care consult Can discuss/progress to hospice care when indicated/desired  Code Status: DNR  Goals of  Care/Recommendations: Quality of life MOST form preferences/goals: Cardiopulmonary Resuscitation: Do Not Attempt Resuscitation (DNR/No CPR)  Medical Interventions: Limited Additional Interventions: Use medical treatment, IV fluids and cardiac monitoring as indicated, DO NOT USE intubation or mechanical ventilation. May consider use of less invasive airway support such as BiPAP or CPAP. Also provide comfort measures. Transfer to the hospital if indicated. Avoid intensive care.   Antibiotics: Antibiotics if indicated  IV Fluids: IV fluids if indicated  Feeding Tube: No feeding tube    Symptom Management: No palliative symptom management needs identified at this time  Prognosis: < 12 months  Discharge Planning: To Be Determined  Discussed with: Patient, Dr. Sloan Leiter  Thank you for allowing [place] to  participate in the care of Geovannie Vilar PMT will continue to support holistically.  Time Total: 45 min  Visit consisted of counseling and education dealing with the complex and emotionally intense issues of symptom management and palliative care in the setting of serious and potentially life-threatening illness. Greater than 50%  of this time was spent counseling and coordinating care related to the above assessment and plan.  Walden Field, NP Palliative Medicine Team  Team Phone # [phone] (Nights/Weekends)  06/19/2021, 4:08 PM

## 2021-06-19 NOTE — Evaluation (Signed)
Occupational Therapy Evaluation Patient Details Name: Roger Mooney MRN: [id number] DOB: 1951/03/27 Today's Date: 06/19/2021    History of Present Illness Pt adm 7/2 with AMS. Pt found to be in shock likely secondary to narcotic overdose. Pt responded well to Narcan. Pt also with ongoing left ventricle thrombus. PMH - polysubstance abuse, chf, pacer/ICD, CAD, DM   Clinical Impression   Patient reports lives with two friends in a single level house with 1 step to enter and is independent at baseline. Patient demonstrates ADL tasks and functional mobility without any physical assistance, is oriented to place, time, self and better able to provide details with session with therapy. No further OT needs identified at this time, will sign off. Please re-consult if new needs arise.    Follow Up Recommendations  No OT follow up    Equipment Recommendations  None recommended by OT       Precautions / Restrictions Precautions Precautions: None Restrictions Weight Bearing Restrictions: No      Mobility Bed Mobility               General bed mobility comments: OOB upon arrival    Transfers Overall transfer level: Independent                    Balance Overall balance assessment: Independent                                         ADL either performed or assessed with clinical judgement   ADL Overall ADL's : Independent                                       General ADL Comments: patient demonstrates lower body dressing, functional transfers and ambulation in room without any physical assistance. patient denies any needs/concerns for OT at this time      Pertinent Vitals/Pain Pain Assessment: No/denies pain     Hand Dominance Right   Extremity/Trunk Assessment Upper Extremity Assessment Upper Extremity Assessment: Overall WFL for tasks assessed   Lower Extremity Assessment Lower Extremity Assessment: Defer to PT evaluation    Cervical / Trunk Assessment Cervical / Trunk Assessment: Normal   Communication Communication Communication: HOH   Cognition Arousal/Alertness: Awake/alert Behavior During Therapy: WFL for tasks assessed/performed Overall Cognitive Status: Within Functional Limits for tasks assessed                                 General Comments: A/o x3, appears to be more clear cognitively today compared to yesterday's PT note when unable to provide any history              Home Living Family/patient expects to be discharged to:: Private residence Living Arrangements: Non-relatives/Friends Available Help at Discharge: Friend(s) Type of Home: House Home Access: Stairs to enter Secretary/administrator of Steps: 1   Home Layout: One level     Bathroom Shower/Tub: Chief Strategy Officer: Standard     Home Equipment: None          Prior Functioning/Environment Level of Independence: Independent                 OT Problem List: Decreased activity tolerance  OT Goals(Current goals can be found in the care plan section) Acute Rehab OT Goals Patient Stated Goal: home OT Goal Formulation: All assessment and education complete, DC therapy   AM-PAC OT "6 Clicks" Daily Activity     Outcome Measure Help from another person eating meals?: None Help from another person taking care of personal grooming?: None Help from another person toileting, which includes using toliet, bedpan, or urinal?: None Help from another person bathing (including washing, rinsing, drying)?: None Help from another person to put on and taking off regular upper body clothing?: None Help from another person to put on and taking off regular lower body clothing?: None 6 Click Score: 24   End of Session Nurse Communication: Mobility status  Activity Tolerance: Patient tolerated treatment well Patient left: in chair;with call bell/phone within reach  OT Visit Diagnosis: Other  abnormalities of gait and mobility (R26.89)                Time: 4742-5956 OT Time Calculation (min): 10 min Charges:  OT General Charges $OT Visit: 1 Visit OT Evaluation $OT Eval Low Complexity: 1 Low  Marlyce Huge OT OT pager: [phone]  Carmelia Roller 06/19/2021, 12:28 PM

## 2021-06-19 NOTE — TOC Initial Note (Signed)
Transition of Care [facility]) - Initial/Assessment Note    Patient Details  Name: Roger Mooney MRN: [id number] Date of Birth: 14-Jan-1951  Transition of [facility]) CM/SW Contact:    Gala Lewandowsky, RN Phone Number: 06/19/2021, 3:02 PM  Clinical Narrative: Risk for readmission assessment completed. Case Manager provided patient with the [facility] information. Patient will not need any occupational or physical therapy for home. Per patient, [name] is the person that he lives with and will be returning to her home. CSW  provided the patient with outpatient substance abuse resources.  No further needs from Case Manager at this time.        Expected Discharge Plan: Home/Self Care Barriers to Discharge: No Barriers Identified   Patient Goals and CMS Choice Patient states their goals for this hospitalization and ongoing recovery are:: to return to his friends Molly's home   Choice offered to / list presented to : NA  Expected Discharge Plan and Services Expected Discharge Plan: Home/Self Care In-house Referral: NA Discharge Planning Services: CM Consult Post Acute Care Choice: NA Living arrangements for the past 2 months: Single Family Home                   DME Agency: NA       HH Arranged: NA    Prior Living Arrangements/Services Living arrangements for the past 2 months: Single Family Home Lives with:: Friends (Patient states he has a home in [facility] as well.) Patient language and need for interpreter reviewed:: Yes Do you feel safe going back to the place where you live?: Yes      Need for Family Participation in Patient Care: Yes (Comment) Care giver support system in place?: Yes (comment)   Criminal Activity/Legal Involvement Pertinent to Current Situation/Hospitalization: No - Comment as needed  Activities of Daily Living Home Assistive Devices/Equipment: None ADL Screening (condition at time of admission) Patient's cognitive ability adequate to safely  complete daily activities?: Yes Is the patient deaf or have difficulty hearing?: Yes Does the patient have difficulty seeing, even when wearing glasses/contacts?: No Does the patient have difficulty concentrating, remembering, or making decisions?: No Patient able to express need for assistance with ADLs?: Yes Does the patient have difficulty dressing or bathing?: No Independently performs ADLs?: Yes (appropriate for developmental age) Does the patient have difficulty walking or climbing stairs?: No Weakness of Legs: None Weakness of Arms/Hands: None  Permission Sought/Granted Permission sought to share information with : Family Supports, Magazine features editor, Case Manager   Emotional Assessment Appearance:: Appears stated age Attitude/Demeanor/Rapport: Engaged Affect (typically observed): Appropriate Orientation: : Oriented to  Time, Oriented to Place, Oriented to Self Alcohol / Substance Use: Not Applicable Psych Involvement: No (comment)  Admission diagnosis:  Confusion [R41.0] Shock (HCC) [R57.9] Patient Active Problem List   Diagnosis Date Noted   Shock (HCC) 06/14/2021   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 coli sepsis with septic shock ---  05/16/2021   CAD-status post prior LAD stent--- last LHC 12/02/2020, with patent vessels apparently 05/16/2021   Hypotension 05/12/2021   HTN (hypertension) 05/12/2021   DM (diabetes mellitus) (HCC) 05/12/2021   Chronic systolic CHF (congestive heart failure) (HCC) 05/12/2021   LV (left ventricular) mural thrombus 05/12/2021   AKI (acute kidney injury) (HCC) 05/12/2021   PCP:  Pcp, No Pharmacy:   [facility] [address] [place] - [address] [place] [address] Phone:  [phone] Fax: [phone]  [facility] [place], [place] - [place] [address] [place] [address] Phone: [phone] Fax: [phone]   Readmission Risk Interventions Readmission Risk Prevention Plan 06/19/2021  Transportation Screening Complete  PCP or Specialist Appt within 3-5 Days Complete  HRI or Home Care Consult Complete  Social Work Consult for Recovery Care Planning/Counseling Complete  Palliative Care Screening Not Applicable  Medication Review Oceanographer) Complete

## 2021-06-19 NOTE — Progress Notes (Signed)
This chaplain responded PMT consult for notarizing the Pt. [place] Directive:  HCPOA and Living Will.  The chaplain understands the Pt. filled out the AD and the document is in the Pt. chart.  The chaplain listened reflectively as the Pt. shared the purpose and role of an [place] Directive.    The chaplain phoned Gatha Mayer per the Pt. request. [name] shares she plans to visit the Pt. today and is aware of her role as the Pt. health care agent.  The chaplain gave [name] the phone number for the Pt. room.  The chaplain notified the notary and witnesses for notarizing the Pt. [place] Directive. The chaplain will notify the Pt. when this service is available.

## 2021-06-19 NOTE — Progress Notes (Signed)
     Referral received for Roger Mooney for goals of care discussion. Chart reviewed and updates received from RN. Patient assessed and is unable to engage appropriately in discussions. Attempted to contact patient's surrogate [name]), but unable to reach. Message at voicemail not set-up. PMT will re-attempt to contact family at a later time/date.   Detailed note and recommendations to follow once GOC has been completed.   Thank you for allowing [place] to participate in the care of [name], [place] Palliative Medicine Team  Phone: [phone] Pager: [phone] Amion: Roney Marion  NO CHARGE

## 2021-06-19 NOTE — Plan of Care (Signed)
  Problem: Cardiac: Goal: Ability to achieve and maintain adequate cardiopulmonary perfusion will improve Outcome: Progressing Goal: Vascular access site(s) Level 0-1 will be maintained Outcome: Progressing   Problem: Fluid Volume: Goal: Ability to achieve a balanced intake and output will improve Outcome: Progressing   Problem: Physical Regulation: Goal: Complications related to the disease process, condition or treatment will be avoided or minimized Outcome: Progressing   Problem: Respiratory: Goal: Will regain and/or maintain adequate ventilation Outcome: Progressing   Problem: Education: Goal: Knowledge of General Education information will improve Description: Including pain rating scale, medication(s)/side effects and non-pharmacologic comfort measures Outcome: Progressing   Problem: Health Behavior/Discharge Planning: Goal: Ability to manage health-related needs will improve Outcome: Progressing   Problem: Clinical Measurements: Goal: Ability to maintain clinical measurements within normal limits will improve Outcome: Progressing Goal: Will remain free from infection Outcome: Progressing Goal: Diagnostic test results will improve Outcome: Progressing Goal: Respiratory complications will improve Outcome: Progressing Goal: Cardiovascular complication will be avoided Outcome: Progressing   Problem: Activity: Goal: Risk for activity intolerance will decrease Outcome: Progressing   Problem: Nutrition: Goal: Adequate nutrition will be maintained Outcome: Progressing   Problem: Coping: Goal: Level of anxiety will decrease Outcome: Progressing   Problem: Elimination: Goal: Will not experience complications related to bowel motility Outcome: Progressing Goal: Will not experience complications related to urinary retention Outcome: Progressing   Problem: Pain Managment: Goal: General experience of comfort will improve Outcome: Progressing   Problem:  Safety: Goal: Ability to remain free from injury will improve Outcome: Progressing   Problem: Skin Integrity: Goal: Risk for impaired skin integrity will decrease Outcome: Progressing   

## 2021-06-20 ENCOUNTER — Telehealth: Payer: Self-pay

## 2021-06-20 DIAGNOSIS — E1169 Type 2 diabetes mellitus with other specified complication: Secondary | ICD-10-CM

## 2021-06-20 LAB — GLUCOSE, CAPILLARY
Glucose-Capillary: 114 mg/dL — ABNORMAL HIGH (ref 70–99)
Glucose-Capillary: 115 mg/dL — ABNORMAL HIGH (ref 70–99)
Glucose-Capillary: 122 mg/dL — ABNORMAL HIGH (ref 70–99)

## 2021-06-20 LAB — MAGNESIUM: Magnesium: 1.9 mg/dL (ref 1.7–2.4)

## 2021-06-20 MED ORDER — MIDODRINE HCL 5 MG PO TABS: 5.0000 mg | ORAL_TABLET | Freq: Three times a day (TID) | ORAL | 0 refills | Status: AC

## 2021-06-20 MED ORDER — APIXABAN 5 MG PO TABS: 5.0000 mg | ORAL_TABLET | Freq: Two times a day (BID) | ORAL | 0 refills | Status: DC

## 2021-06-20 NOTE — Progress Notes (Signed)
CSW notified by medical team of inability to reach the patient's family, [name]. CSW attempted to call, unable to leave a voicemail, sent a text message with no response. CSW contacted [facility] non-emergency dispatch to ask for a well check at the patient's address to locate family for discharge. CSW later received a call from [place] that her grandchild was born late in the night and she had been catching up on sleep this morning. CSW confirmed [name] was home and patient could discharge. CSW provided cab voucher for patient to return home, provided to RN who was reviewing discharge instructions.  No other TOC needs at this time.  Blenda Nicely, [place] Clinical Social Worker [phone]

## 2021-06-20 NOTE — Discharge Summary (Signed)
PATIENT DETAILS Name: Roger Mooney Age: 70 y.o. Sex: male Date of Birth: December 18, 1950 MRN: [id number]. Admitting Physician: Jolyn Nap, MD PCP:Pcp, No  Admit Date: 06/14/2021 Discharge date: 06/20/2021  Recommendations for Outpatient Follow-up:  Follow up with PCP in 1-2 weeks Please obtain CMP/CBC in one week Please ensure follow up with cardiology Continue counseling regarding importance of avoiding polysubstance use.   Admitted From:  Home  Disposition: Home   Home Health: No  Equipment/Devices: None  Discharge Condition: Stable  CODE STATUS: DNR  Diet recommendation:  Diet Order             Diet - low sodium heart healthy           Diet Carb Modified           Diet Heart Room service appropriate? Yes; Fluid consistency: Thin; Fluid restriction: 1800 mL Fluid  Diet effective now                   Brief Narrative: Patient is a 70 y.o. male with history of HFrEF (EF 25%) LV thrombus, HTN, DM, polysubstance abuse-who presented to the ED on 7/2 with confusion-he was found to have acute toxic encephalopathy and shock (hypotensive) likely due to opiate overdose.  Patient responded well to Narcan and supportive care-he was admitted initially to the ICU-and upon stability-was transferred to the [facility] hospitalist service on 7/6.   Significant events: 7/2>> admit to ICU-acute toxic encephalopathy/hypotension due to narcotic use. 7/3>> off Narcan infusion 7/4>> off pressors 7/6>> transfer to [facility]   Significant studies: 7/2>> UDS: Positive for cannabinoids/opiates 7/2>> CT head: No acute intracranial abnormalities. 7/2>> CXR: No overt edema-no overt PNA. 7/3>> TTE: EF 20-25%, large apical thrombus   Antimicrobial therapy: Cefepime: 7/2 x 1 Zosyn: 7/2 x 1 Vancomycin: 7/2 x 1   Microbiology data: 7/2>> blood culture: Negative 7/2>> COVID/influenza PCR: Negative   Procedures : None   Consults: PCCM, palliative care    Brief Hospital  Course: Acute toxic encephalopathy-shock due to narcotic overdose (unintentional): Resolved-he is completely awake and alert.  BP soft but stable.  He was initially admitted to the ICU and required Narcan and norepinephrine infusion.     AKI: Hemodynamically mediated-in the setting of hypotension-creatinine has now normalized.   HFrEF-s/p ICD: Euvolemic-BP soft-have started midodrine-start Coreg-tolerating low-dose Lasix.  Given noncompliance/severity of AKI/soft blood pressure-May be better to hold off on restarting ACEI/Entresto.  Will need to follow-up with cardiology in the outpatient setting-if compliant-blood pressure stable-can always reconsider.   History of LV thrombus: Diagnosed April 2022-Per prior notes-noncompliant with Coumadin-now on Eliquis.  Needs outpatient follow-up with cardiology.   CAD-history of prior PCI: No anginal symptoms-not on antiplatelet agents-as on anticoagulation   ?  Cognitive dysfunction: On 7/6-thought to be somewhat confused-however it now appears that he has significant hearing disability-this morning-when this MD spoke to him with a loud voice-he was able to answer all questions appropriately.  Do not think he has any underlying cognitive dysfunction at this point.   Hypomagnesemia: Repleted.   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   History of polysubstance abuse: Have counseled extensively-he understands life-threatening/life disabling effects of polysubstance use.   Palliative care: Unfortunate 70 year old with significant comorbidities as noted above-ongoing substance use-noncompliance-numerous recent hospitalizations-overall long-term prognosis is poor-palliative care following-now a DNR.  Attempted to call patient's adopted daughter-Roger Mooney-x2 on 7/8-unable to leave voicemail-as  a voicemail not set  up.   Discharge Diagnoses:  Active Problems:   Shock [facility])   Discharge Instructions:  Activity:  As tolerated   Discharge Instructions     (HEART FAILURE PATIENTS) Call MD:  Anytime you have any of the following symptoms: 1) 3 pound weight gain in 24 hours or 5 pounds in 1 week 2) shortness of breath, with or without a dry hacking cough 3) swelling in the hands, feet or stomach 4) if you have to sleep on extra pillows at night in order to breathe.   Complete by: As directed    Call MD for:  extreme fatigue   Complete by: As directed    Call MD for:  persistant dizziness or light-headedness   Complete by: As directed    Diet - low sodium heart healthy   Complete by: As directed    Diet Carb Modified   Complete by: As directed    Discharge instructions   Complete by: As directed    Follow with Primary MD in 1-2 weeks  Follow-up with cardiology in the next 1-2 weeks  Stop cocaine/narcotic use  Please get a complete blood count and chemistry panel checked by your Primary MD at your next visit, and again as instructed by your Primary MD.  Get Medicines reviewed and adjusted: Please take all your medications with you for your next visit with your Primary MD  Laboratory/radiological data: Please request your Primary MD to go over all hospital tests and procedure/radiological results at the follow up, please ask your Primary MD to get all Hospital records sent to his/her office.  In some cases, they will be blood work, cultures and biopsy results pending at the time of your discharge. Please request that your primary care M.D. follows up on these results.  Also Note the following: If you experience worsening of your admission symptoms, develop shortness of breath, life threatening emergency, suicidal or homicidal thoughts you must seek medical attention immediately by calling 911 or calling your MD immediately  if symptoms less severe.  You must read complete  instructions/literature along with all the possible adverse reactions/side effects for all the Medicines you take and that have been prescribed to you. Take any new Medicines after you have completely understood and accpet all the possible adverse reactions/side effects.   Do not drive when taking Pain medications or sleeping medications (Benzodaizepines)  Do not take more than prescribed Pain, Sleep and Anxiety Medications. It is not advisable to combine anxiety,sleep and pain medications without talking with your primary care practitioner  Special Instructions: If you have smoked or chewed Tobacco  in the last 2 yrs please stop smoking, stop any regular Alcohol  and or any Recreational drug use.  Wear Seat belts while driving.  Please note: You were cared for by a hospitalist during your hospital stay. Once you are discharged, your primary care physician will handle any further medical issues. Please note that NO REFILLS for any discharge medications will be authorized once you are discharged, as it is imperative that you return to your primary care physician (or establish a relationship with a primary care physician if you do not have one) for your post hospital discharge needs so that they can reassess your need for medications and monitor your lab values.   Increase activity slowly   Complete by: As directed       Allergies as of 06/20/2021   No Known Allergies      Medication List  STOP taking these medications    aspirin 81 MG EC tablet   bumetanide 0.5 MG tablet Commonly known as: BUMEX   lisinopril 5 MG tablet Commonly known as: ZESTRIL       TAKE these medications    acetaminophen 325 MG tablet Commonly known as: TYLENOL Take 2 tablets (650 mg total) by mouth every 6 (six) hours as needed for mild pain (or Fever >/= 101).   apixaban 5 MG Tabs tablet Commonly known as: ELIQUIS Take 1 tablet (5 mg total) by mouth 2 (two) times daily.   atorvastatin 40 MG  tablet Commonly known as: LIPITOR Take 1 tablet (40 mg total) by mouth daily.   colchicine 0.6 MG tablet Take 1 tablet (0.6 mg total) by mouth 2 (two) times daily.   furosemide 20 MG tablet Commonly known as: Lasix Take 1 tablet (20 mg total) by mouth See admin instructions. Take Lasix 20 mg daily for at least 2 days if you gain more than 3 pounds in 1 day or more than 5 pounds in a week   gabapentin 400 MG capsule Commonly known as: NEURONTIN Take 400 mg by mouth 3 (three) times daily.   isosorbide mononitrate 30 MG 24 hr tablet Commonly known as: IMDUR Take 30 mg by mouth daily.   metFORMIN 500 MG tablet Commonly known as: GLUCOPHAGE Take 1 tablet (500 mg total) by mouth 2 (two) times daily with a meal.   metoprolol succinate 25 MG 24 hr tablet Commonly known as: TOPROL-XL Take 0.5 tablets (12.5 mg total) by mouth daily.   midodrine 5 MG tablet Commonly known as: PROAMATINE Take 1 tablet (5 mg total) by mouth 3 (three) times daily with meals.   pantoprazole 40 MG tablet Commonly known as: PROTONIX Take 1 tablet (40 mg total) by mouth daily.   PARoxetine 20 MG tablet Commonly known as: PAXIL Take 1 tablet (20 mg total) by mouth daily.   QUEtiapine 25 MG tablet Commonly known as: SEROQUEL Take 25 mg by mouth at bedtime.        Follow-up Information     Dyann Kief, PA-C Follow up on 06/23/2021.   Specialty: Cardiology Why: appt at 12:30 pm Contact information: [address] [place] [address] [phone]                No Known Allergies   Consultations:  pulmonary/intensive care and Palliative   Other Procedures/Studies: DG Chest 1 View  Result Date: 06/14/2021 CLINICAL DATA:  Chest pain and confusion EXAM: CHEST  1 VIEW COMPARISON:  05/14/2021 FINDINGS: Stable appearance of the AICD. Low lung volumes are present, causing crowding of the pulmonary vasculature. Upper normal size of the cardiac shadow. Suspected mild lingular scarring,  unchanged. Subtle accentuation of the interstitium, unchanged. No overt edema. No blunting of the costophrenic angles. IMPRESSION: 1. Persistent low lung volumes, with some stable faint opacity at the left lung base likely representing scarring in the lingula. Persistent fine interstitial accentuation, without overt edema. Electronically Signed   By: Gaylyn Rong M.D.   On: 06/14/2021 17:43   CT Head Wo Contrast  Result Date: 06/14/2021 CLINICAL DATA:  Mental status change.  Combative and hallucinating. EXAM: CT HEAD WITHOUT CONTRAST TECHNIQUE: Contiguous axial images were obtained from the base of the skull through the vertex without intravenous contrast. COMPARISON:  None. FINDINGS: Brain: No evidence of acute infarction, hemorrhage, hydrocephalus, extra-axial collection or mass lesion/mass effect. Vascular: No hyperdense vessel or unexpected calcification. Skull: Normal. Negative for  fracture or focal lesion. Sinuses/Orbits: No acute finding. Other: None. IMPRESSION: No acute intracranial abnormalities. Normal brain. Electronically Signed   By: Signa Kellaylor  Stroud M.D.   On: 06/14/2021 17:19   DG Chest Port 1 View  Result Date: 06/14/2021 CLINICAL DATA:  Central line. EXAM: PORTABLE CHEST 1 VIEW COMPARISON:  Chest radiograph earlier today.  Chest CT 05/12/2021 FINDINGS: Lateral aspect of the right lower thorax inadvertently excluded from the field of view. New right internal jugular central venous catheter tip overlies the upper SVC. No evidence of pneumothorax, left lateral chest not entirely included in the field of view. Persistent low lung volumes. Left-sided pacemaker in place. Stable cardiomegaly. Increasing interstitial thickening. No pleural effusion. IMPRESSION: 1. Tip of the right internal jugular central venous catheter overlies the upper SVC. No evidence of pneumothorax. 2. Increasing interstitial thickening may be pulmonary edema or atypical infection. 3. Stable cardiomegaly. 4. Lateral right  hemithorax inadvertently excluded from the field of view. Electronically Signed   By: Narda RutherfordMelanie  Sanford M.D.   On: 06/14/2021 20:56   ECHOCARDIOGRAM COMPLETE  Result Date: 06/15/2021    ECHOCARDIOGRAM REPORT   Patient Name:   Denzil MagnusonLBERT Leger Date of Exam: 06/15/2021 Medical Rec #:  [id number]      Height:       71.0 in Accession #:    [id number]     Weight:       166.7 lb Date of Birth:  05/15/1951      BSA:          1.951 m Patient Age:    69 years       BP:           116/92 mmHg Patient Gender: M              HR:           90 bpm. Exam Location:  Inpatient Procedure: 2D Echo, Color Doppler, Cardiac Doppler and Intracardiac            Opacification Agent Indications:    Shock  History:        Patient has prior history of Echocardiogram examinations, most                 recent 05/13/2021. CHF, Defibrillator; Risk Factors:Hypertension,                 Diabetes, Dyslipidemia and Polysubstance abuse.  Sonographer:    Irving BurtonEmily Senior RDCS Referring Phys: [id number] Jolyn NapMANUEL E IZQUIERDO  Sonographer Comments: History of mural thrombus IMPRESSIONS  1. Technically difficult; akinesis of the anteroseptal wall, distal inferiior wall and apex; large apical thrombus noted new compared to 05/13/21; results called to Lynnell Catalanavi Agarwala MD.  2. Left ventricular ejection fraction, by estimation, is 20 to 25%. The left ventricle has severely decreased function. The left ventricle demonstrates regional wall motion abnormalities (see scoring diagram/findings for description). Left ventricular diastolic parameters are consistent with Grade I diastolic dysfunction (impaired relaxation).  3. Right ventricular systolic function is normal. The right ventricular size is normal.  4. The mitral valve is normal in structure. Trivial mitral valve regurgitation. No evidence of mitral stenosis.  5. The aortic valve is tricuspid. Aortic valve regurgitation is not visualized. No aortic stenosis is present.  6. Aortic dilatation noted. There is borderline  dilatation of the aortic root, measuring 38 mm.  7. The inferior vena cava is normal in size with greater than 50% respiratory variability, suggesting right atrial pressure of 3 mmHg. FINDINGS  Left Ventricle: Left  ventricular ejection fraction, by estimation, is 20 to 25%. The left ventricle has severely decreased function. The left ventricle demonstrates regional wall motion abnormalities. Definity contrast agent was given IV to delineate the left ventricular endocardial borders. The left ventricular internal cavity size was normal in size. There is no left ventricular hypertrophy. Left ventricular diastolic parameters are consistent with Grade I diastolic dysfunction (impaired relaxation). Right Ventricle: The right ventricular size is normal.Right ventricular systolic function is normal. Left Atrium: Left atrial size was normal in size. Right Atrium: Right atrial size was normal in size. Pericardium: There is no evidence of pericardial effusion. Mitral Valve: The mitral valve is normal in structure. Trivial mitral valve regurgitation. No evidence of mitral valve stenosis. Tricuspid Valve: The tricuspid valve is normal in structure. Tricuspid valve regurgitation is not demonstrated. No evidence of tricuspid stenosis. Aortic Valve: The aortic valve is tricuspid. Aortic valve regurgitation is not visualized. No aortic stenosis is present. Pulmonic Valve: The pulmonic valve was not well visualized. Pulmonic valve regurgitation is not visualized. No evidence of pulmonic stenosis. Aorta: Aortic dilatation noted. There is borderline dilatation of the aortic root, measuring 38 mm. Venous: The inferior vena cava is normal in size with greater than 50% respiratory variability, suggesting right atrial pressure of 3 mmHg.  Additional Comments: Technically difficult; akinesis of the anteroseptal wall, distal inferiior wall and apex; large apical thrombus noted new compared to 05/13/21; results called to Lynnell Catalan MD. A  device lead is visualized.  LEFT VENTRICLE PLAX 2D LVOT diam:     2.50 cm  Diastology LV SV:         60       LV e' medial:    5.98 cm/s LV SV Index:   31       LV E/e' medial:  6.7 LVOT Area:     4.91 cm LV e' lateral:   4.90 cm/s                         LV E/e' lateral: 8.1  RIGHT VENTRICLE RV S prime:     6.42 cm/s TAPSE (M-mode): 1.3 cm LEFT ATRIUM             Index       RIGHT ATRIUM          Index LA diam:        2.40 cm 1.23 cm/m  RA Area:     9.36 cm LA Vol (A2C):   35.5 ml 18.19 ml/m RA Volume:   16.90 ml 8.66 ml/m LA Vol (A4C):   26.3 ml 13.48 ml/m LA Biplane Vol: 30.5 ml 15.63 ml/m  AORTIC VALVE LVOT Vmax:   86.20 cm/s LVOT Vmean:  65.800 cm/s LVOT VTI:    0.122 m  AORTA Ao Root diam: 3.80 cm Ao Asc diam:  3.50 cm MITRAL VALVE MV Area (PHT): 3.10 cm    SHUNTS MV Decel Time: 245 msec    Systemic VTI:  0.12 m MV E velocity: 39.80 cm/s  Systemic Diam: 2.50 cm MV A velocity: 51.40 cm/s MV E/A ratio:  0.77 Olga Millers MD Electronically signed by Olga Millers MD Signature Date/Time: 06/15/2021/11:35:11 AM    Final      TODAY-DAY OF DISCHARGE:  Subjective:   Roger Mooney today has no headache,no chest abdominal pain,no new weakness tingling or numbness, feels much better wants to go home today.   Objective:   Blood pressure 122/78, pulse 64, temperature 97.9 F (  36.6 C), temperature source Oral, resp. rate 16, height 5\' 11"  (1.803 m), weight 72.9 kg, SpO2 98 %.  Intake/Output Summary (Last 24 hours) at 06/20/2021 0835 Last data filed at 06/19/2021 2348 Gross per 24 hour  Intake 590 ml  Output --  Net 590 ml   Filed Weights   06/18/21 0417 06/19/21 0452 06/20/21 0452  Weight: 73.2 kg 72.6 kg 72.9 kg    Exam: Awake Alert, Oriented *3, No new F.N deficits, Normal affect [place].AT,PERRAL Supple Neck,No JVD, No cervical lymphadenopathy appriciated.  Symmetrical Chest wall movement, Good air movement bilaterally, CTAB RRR,No Gallops,Rubs or new Murmurs, No Parasternal Heave +ve  B.Sounds, Abd Soft, Non tender, No organomegaly appriciated, No rebound -guarding or rigidity. No Cyanosis, Clubbing or edema, No new Rash or bruise   PERTINENT RADIOLOGIC STUDIES: No results found.   PERTINENT LAB RESULTS: CBC: Recent Labs    06/18/21 0414  WBC 11.8*  HGB 14.0  HCT 43.6  PLT 302   CMET CMP     Component Value Date/Time   NA 135 06/19/2021 0511   K 4.2 06/19/2021 0511   CL 104 06/19/2021 0511   CO2 23 06/19/2021 0511   GLUCOSE 92 06/19/2021 0511   BUN 10 06/19/2021 0511   CREATININE 1.14 06/19/2021 0511   CALCIUM 8.8 (L) 06/19/2021 0511   PROT 6.0 (L) 06/17/2021 0030   ALBUMIN 3.0 (L) 06/17/2021 0030   AST 20 06/17/2021 0030   ALT 14 06/17/2021 0030   ALKPHOS 41 06/17/2021 0030   BILITOT 1.4 (H) 06/17/2021 0030   GFRNONAA >60 06/19/2021 0511    GFR Estimated Creatinine Clearance: 63.1 mL/min (by C-G formula based on SCr of 1.14 mg/dL). No results for input(s): LIPASE, AMYLASE in the last 72 hours. No results for input(s): CKTOTAL, CKMB, CKMBINDEX, TROPONINI in the last 72 hours. Invalid input(s): POCBNP No results for input(s): DDIMER in the last 72 hours. No results for input(s): HGBA1C in the last 72 hours. No results for input(s): CHOL, HDL, LDLCALC, TRIG, CHOLHDL, LDLDIRECT in the last 72 hours. No results for input(s): TSH, T4TOTAL, T3FREE, THYROIDAB in the last 72 hours.  Invalid input(s): FREET3 No results for input(s): VITAMINB12, FOLATE, FERRITIN, TIBC, IRON, RETICCTPCT in the last 72 hours. Coags: No results for input(s): INR in the last 72 hours.  Invalid input(s): PT Microbiology: Recent Results (from the past 240 hour(s))  Resp Panel by RT-PCR (Flu A&B, Covid) Nasopharyngeal Swab     Status: None   Collection Time: 06/14/21  3:22 PM   Specimen: Nasopharyngeal Swab; Nasopharyngeal(NP) swabs in vial transport medium  Result Value Ref Range Status   SARS Coronavirus 2 by RT PCR NEGATIVE NEGATIVE Final    Comment:  (NOTE) SARS-CoV-2 target nucleic acids are NOT DETECTED.  The SARS-CoV-2 RNA is generally detectable in upper respiratory specimens during the acute phase of infection. The lowest concentration of SARS-CoV-2 viral copies this assay can detect is 138 copies/mL. A negative result does not preclude SARS-Cov-2 infection and should not be used as the sole basis for treatment or other patient management decisions. A negative result may occur with  improper specimen collection/handling, submission of specimen other than nasopharyngeal swab, presence of viral mutation(s) within the areas targeted by this assay, and inadequate number of viral copies(<138 copies/mL). A negative result must be combined with clinical observations, patient history, and epidemiological information. The expected result is Negative.  Fact Sheet for Patients:  BloggerCourse.com  Fact Sheet for Healthcare Providers:  SeriousBroker.it  This test is  no t yet approved or cleared by the [place] and  has been authorized for detection and/or diagnosis of SARS-CoV-2 by FDA under an Emergency Use Authorization (EUA). This EUA will remain  in effect (meaning this test can be used) for the duration of the COVID-19 declaration under Section 564(b)(1) of the Act, 21 U.S.C.section 360bbb-3(b)(1), unless the authorization is terminated  or revoked sooner.       Influenza A by PCR NEGATIVE NEGATIVE Final   Influenza B by PCR NEGATIVE NEGATIVE Final    Comment: (NOTE) The Xpert Xpress SARS-CoV-2/FLU/RSV plus assay is intended as an aid in the diagnosis of influenza from Nasopharyngeal swab specimens and should not be used as a sole basis for treatment. Nasal washings and aspirates are unacceptable for Xpert Xpress SARS-CoV-2/FLU/RSV testing.  Fact Sheet for Patients: BloggerCourse.com  Fact Sheet for Healthcare  Providers: SeriousBroker.it  This test is not yet approved or cleared by the [place] FDA and has been authorized for detection and/or diagnosis of SARS-CoV-2 by FDA under an Emergency Use Authorization (EUA). This EUA will remain in effect (meaning this test can be used) for the duration of the COVID-19 declaration under Section 564(b)(1) of the Act, 21 U.S.C. section 360bbb-3(b)(1), unless the authorization is terminated or revoked.  Performed at [facility], [address] [place], [place] [address]   Culture, blood (routine x 2)     Status: None   Collection Time: 06/14/21  3:50 PM   Specimen: Left Antecubital; Blood  Result Value Ref Range Status   Specimen Description   Final    LEFT ANTECUBITAL BOTTLES DRAWN AEROBIC AND ANAEROBIC   Special Requests Blood Culture adequate volume  Final   Culture   Final    NO GROWTH 5 DAYS Performed at [facility], [address], [place], [place] [address]    Report Status 06/19/2021 FINAL  Final  Culture, blood (routine x 2)     Status: None   Collection Time: 06/14/21  4:05 PM   Specimen: Right Antecubital; Blood  Result Value Ref Range Status   Specimen Description   Final    RIGHT ANTECUBITAL BOTTLES DRAWN AEROBIC AND ANAEROBIC   Special Requests Blood Culture adequate volume  Final   Culture   Final    NO GROWTH 5 DAYS Performed at [facility], [address] [place], [place] [address]    Report Status 06/19/2021 FINAL  Final  MRSA Next Gen by PCR, Nasal     Status: None   Collection Time: 06/14/21 10:40 PM   Specimen: Nasal Mucosa; Nasal Swab  Result Value Ref Range Status   MRSA by PCR Next Gen NOT DETECTED NOT DETECTED Final    Comment: (NOTE) The GeneXpert MRSA Assay (FDA approved for NASAL specimens only), is one component of a comprehensive MRSA colonization surveillance program. It is not intended to diagnose MRSA infection nor to guide or monitor treatment for MRSA infections. Test  performance is not FDA approved in patients less than 78 years old. Performed at [facility], [address], [place], [place] [address]     FURTHER DISCHARGE INSTRUCTIONS:  Get Medicines reviewed and adjusted: Please take all your medications with you for your next visit with your Primary MD  Laboratory/radiological data: Please request your Primary MD to go over all hospital tests and procedure/radiological results at the follow up, please ask your Primary MD to get all Hospital records sent to his/her office.  In some cases, they will be  blood work, cultures and biopsy results pending at the time of your discharge. Please request that your primary care M.D. goes through all the records of your hospital data and follows up on these results.  Also Note the following: If you experience worsening of your admission symptoms, develop shortness of breath, life threatening emergency, suicidal or homicidal thoughts you must seek medical attention immediately by calling 911 or calling your MD immediately  if symptoms less severe.  You must read complete instructions/literature along with all the possible adverse reactions/side effects for all the Medicines you take and that have been prescribed to you. Take any new Medicines after you have completely understood and accpet all the possible adverse reactions/side effects.   Do not drive when taking Pain medications or sleeping medications (Benzodaizepines)  Do not take more than prescribed Pain, Sleep and Anxiety Medications. It is not advisable to combine anxiety,sleep and pain medications without talking with your primary care practitioner  Special Instructions: If you have smoked or chewed Tobacco  in the last 2 yrs please stop smoking, stop any regular Alcohol  and or any Recreational drug use.  Wear Seat belts while driving.  Please note: You were cared for by a hospitalist during your hospital stay. Once you are discharged, your  primary care physician will handle any further medical issues. Please note that NO REFILLS for any discharge medications will be authorized once you are discharged, as it is imperative that you return to your primary care physician (or establish a relationship with a primary care physician if you do not have one) for your post hospital discharge needs so that they can reassess your need for medications and monitor your lab values.  Total Time spent coordinating discharge including counseling, education and face to face time equals 35 minutes.  SignedJeoffrey Massed 06/20/2021 8:35 AM

## 2021-06-20 NOTE — Telephone Encounter (Signed)
error 

## 2021-06-20 NOTE — Care Management Important Message (Signed)
Important Message  Patient Details  Name: Roger Mooney MRN: [id number] Date of Birth: 03/15/1951   Medicare Important Message Given:  Yes     Renie Ora 06/20/2021, 8:53 AM

## 2021-06-20 NOTE — Care Management (Signed)
06-20-21 Case Manager called [facility] and they can see the patient for outpatient palliative services. [facility] to contact the patient. No further needs at this time.

## 2021-06-23 ENCOUNTER — Encounter: Payer: Self-pay | Admitting: Physician Assistant

## 2021-06-23 ENCOUNTER — Ambulatory Visit (INDEPENDENT_AMBULATORY_CARE_PROVIDER_SITE_OTHER): Payer: 59 | Admitting: Physician Assistant

## 2021-06-23 ENCOUNTER — Other Ambulatory Visit: Payer: Self-pay

## 2021-06-23 VITALS — BP 86/54 | HR 62 | Ht 71.0 in | Wt 138.0 lb

## 2021-06-23 DIAGNOSIS — Z95810 Presence of automatic (implantable) cardiac defibrillator: Secondary | ICD-10-CM | POA: Diagnosis not present

## 2021-06-23 DIAGNOSIS — I5022 Chronic systolic (congestive) heart failure: Secondary | ICD-10-CM | POA: Diagnosis not present

## 2021-06-23 DIAGNOSIS — F1910 Other psychoactive substance abuse, uncomplicated: Secondary | ICD-10-CM

## 2021-06-23 DIAGNOSIS — R9389 Abnormal findings on diagnostic imaging of other specified body structures: Secondary | ICD-10-CM

## 2021-06-23 DIAGNOSIS — I2510 Atherosclerotic heart disease of native coronary artery without angina pectoris: Secondary | ICD-10-CM

## 2021-06-23 DIAGNOSIS — I5043 Acute on chronic combined systolic (congestive) and diastolic (congestive) heart failure: Secondary | ICD-10-CM | POA: Diagnosis not present

## 2021-06-23 DIAGNOSIS — I513 Intracardiac thrombosis, not elsewhere classified: Secondary | ICD-10-CM

## 2021-06-23 MED ORDER — ISOSORBIDE MONONITRATE ER 30 MG PO TB24: 15.0000 mg | ORAL_TABLET | Freq: Every day | ORAL | 3 refills | Status: AC

## 2021-06-23 NOTE — Patient Instructions (Signed)
Medication Instructions:  Your physician has recommended you make the following change in your medication:   Decrease Imdur to 15 mg ( 1/2 Tablet) Daily   *If you need a refill on your cardiac medications before your next appointment, please call your pharmacy*   Lab Work: NONE   If you have labs (blood work) drawn today and your tests are completely normal, you will receive your results only by: MyChart Message (if you have MyChart) OR A paper copy in the mail If you have any lab test that is abnormal or we need to change your treatment, we will call you to review the results.   Testing/Procedures: NONE    Follow-Up: At [facility], you and your health needs are our priority.  As part of our continuing mission to provide you with exceptional heart care, we have created designated Provider Care Teams.  These Care Teams include your primary Cardiologist (physician) and Advanced Practice Providers (APPs -  Physician Assistants and Nurse Practitioners) who all work together to provide you with the care you need, when you need it.  We recommend signing up for the patient portal called "MyChart".  Sign up information is provided on this After Visit Summary.  MyChart is used to connect with patients for Virtual Visits (Telemedicine).  Patients are able to view lab/test results, encounter notes, upcoming appointments, etc.  Non-urgent messages can be sent to your provider as well.   To learn more about what you can do with MyChart, go to [email or web address].    Your next appointment:   3 month(s)  The format for your next appointment:   In Person  Provider:   Dina Rich, MD   Other Instructions Follow up with Dr. Ladona Ridgel for ICD.   Thank you for choosing [facility]!  Two Gram Sodium Diet 2000 mg  What is Sodium? Sodium is a mineral found naturally in many foods. The most significant source of sodium in the diet is table salt, which is about 40% sodium.   Processed, convenience, and preserved foods also contain a large amount of sodium.  The body needs only 500 mg of sodium daily to function,  A normal diet provides more than enough sodium even if you do not use salt.  Why Limit Sodium? A build up of sodium in the body can cause thirst, increased blood pressure, shortness of breath, and water retention.  Decreasing sodium in the diet can reduce edema and risk of heart attack or stroke associated with high blood pressure.  Keep in mind that there are many other factors involved in these health problems.  Heredity, obesity, lack of exercise, cigarette smoking, stress and what you eat all play a role.  General Guidelines: Do not add salt at the table or in cooking.  One teaspoon of salt contains over 2 grams of sodium. Read food labels Avoid processed and convenience foods Ask your dietitian before eating any foods not dicussed in the menu planning guidelines Consult your physician if you wish to use a salt substitute or a sodium containing medication such as antacids.  Limit milk and milk products to 16 oz (2 cups) per day.  Shopping Hints: READ LABELS!! "Dietetic" does not necessarily mean low sodium. Salt and other sodium ingredients are often added to foods during processing.    Menu Planning Guidelines Food Group Choose More Often Avoid  Beverages (see also the milk group All fruit juices, low-sodium, salt-free vegetables juices, low-sodium carbonated beverages Regular vegetable or tomato juices,  commercially softened water used for drinking or cooking  Breads and Cereals Enriched white, wheat, rye and pumpernickel bread, hard rolls and dinner rolls; muffins, cornbread and waffles; most dry cereals, cooked cereal without added salt; unsalted crackers and breadsticks; low sodium or homemade bread crumbs Bread, rolls and crackers with salted tops; quick breads; instant hot cereals; pancakes; commercial bread stuffing; self-rising flower and  biscuit mixes; regular bread crumbs or cracker crumbs  Desserts and Sweets Desserts and sweets mad with mild should be within allowance Instant pudding mixes and cake mixes  Fats Butter or margarine; vegetable oils; unsalted salad dressings, regular salad dressings limited to 1 Tbs; light, sour and heavy cream Regular salad dressings containing bacon fat, bacon bits, and salt pork; snack dips made with instant soup mixes or processed cheese; salted nuts  Fruits Most fresh, frozen and canned fruits Fruits processed with salt or sodium-containing ingredient (some dried fruits are processed with sodium sulfites        Vegetables Fresh, frozen vegetables and low- sodium canned vegetables Regular canned vegetables, sauerkraut, pickled vegetables, and others prepared in brine; frozen vegetables in sauces; vegetables seasoned with ham, bacon or salt pork  Condiments, Sauces, Miscellaneous  Salt substitute with physician's approval; pepper, herbs, spices; vinegar, lemon or lime juice; hot pepper sauce; garlic powder, onion powder, low sodium soy sauce (1 Tbs.); low sodium condiments (ketchup, chili sauce, mustard) in limited amounts (1 tsp.) fresh ground horseradish; unsalted tortilla chips, pretzels, potato chips, popcorn, salsa (1/4 cup) Any seasoning made with salt including garlic salt, celery salt, onion salt, and seasoned salt; sea salt, rock salt, kosher salt; meat tenderizers; monosodium glutamate; mustard, regular soy sauce, barbecue, sauce, chili sauce, teriyaki sauce, steak sauce, Worcestershire sauce, and most flavored vinegars; canned gravy and mixes; regular condiments; salted snack foods, olives, picles, relish, horseradish sauce, catsup   Food preparation: Try these seasonings Meats:    Pork Sage, onion Serve with applesauce  Chicken Poultry seasoning, thyme, parsley Serve with cranberry sauce  Lamb Curry powder, rosemary, garlic, thyme Serve with mint sauce or jelly  Veal Marjoram, basil  Serve with current jelly, cranberry sauce  Beef Pepper, bay leaf Serve with dry mustard, unsalted chive butter  Fish Bay leaf, dill Serve with unsalted lemon butter, unsalted parsley butter  Vegetables:    Asparagus Lemon juice   Broccoli Lemon juice   Carrots Mustard dressing parsley, mint, nutmeg, glazed with unsalted butter and sugar   Green beans Marjoram, lemon juice, nutmeg,dill seed   Tomatoes Basil, marjoram, onion   Spice /blend for [employer]" 4 tsp ground thyme 1 tsp ground sage 3 tsp ground rosemary 4 tsp ground marjoram   Test your knowledge A product that says "Salt Free" may still contain sodium. True or False Garlic Powder and Hot Pepper Sauce an be used as alternative seasonings.True or False Processed foods have more sodium than fresh foods.  True or False Canned Vegetables have less sodium than froze True or False   WAYS TO DECREASE YOUR SODIUM INTAKE Avoid the use of added salt in cooking and at the table.  Table salt (and other prepared seasonings which contain salt) is probably one of the greatest sources of sodium in the diet.  Unsalted foods can gain flavor from the sweet, sour, and butter taste sensations of herbs and spices.  Instead of using salt for seasoning, try the following seasonings with the foods listed.  Remember: how you use them to enhance natural food flavors is limited only by  your creativity... Allspice-Meat, fish, eggs, fruit, peas, red and yellow vegetables Almond Extract-Fruit baked goods Anise Seed-Sweet breads, fruit, carrots, beets, cottage cheese, cookies (tastes like licorice) Basil-Meat, fish, eggs, vegetables, rice, vegetables salads, soups, sauces Bay Leaf-Meat, fish, stews, poultry Burnet-Salad, vegetables (cucumber-like flavor) Caraway Seed-Bread, cookies, cottage cheese, meat, vegetables, cheese, rice Cardamon-Baked goods, fruit, soups Celery Powder or seed-Salads, salad dressings, sauces, meatloaf, soup, bread.Do not use  celery  salt Chervil-Meats, salads, fish, eggs, vegetables, cottage cheese (parsley-like flavor) Chili Power-Meatloaf, chicken cheese, corn, eggplant, egg dishes Chives-Salads cottage cheese, egg dishes, soups, vegetables, sauces Cilantro-Salsa, casseroles Cinnamon-Baked goods, fruit, pork, lamb, chicken, carrots Cloves-Fruit, baked goods, fish, pot roast, green beans, beets, carrots Coriander-Pastry, cookies, meat, salads, cheese (lemon-orange flavor) Cumin-Meatloaf, fish,cheese, eggs, cabbage,fruit pie (caraway flavor) [employer], fruit, eggs, fish, poultry, cottage cheese, vegetables Dill Seed-Meat, cottage cheese, poultry, vegetables, fish, salads, bread Fennel Seed-Bread, cookies, apples, pork, eggs, fish, beets, cabbage, cheese, Licorice-like flavor Garlic-(buds or powder) Salads, meat, poultry, fish, bread, butter, vegetables, potatoes.Do not  use garlic salt Ginger-Fruit, vegetables, baked goods, meat, fish, poultry Horseradish Root-Meet, vegetables, butter Lemon Juice or Extract-Vegetables, fruit, tea, baked goods, fish salads Mace-Baked goods fruit, vegetables, fish, poultry (taste like nutmeg) Maple Extract-Syrups Marjoram-Meat, chicken, fish, vegetables, breads, green salads (taste like Sage) Mint-Tea, lamb, sherbet, vegetables, desserts, carrots, cabbage Mustard, Dry or Seed-Cheese, eggs, meats, vegetables, poultry Nutmeg-Baked goods, fruit, chicken, eggs, vegetables, desserts Onion Powder-Meat, fish, poultry, vegetables, cheese, eggs, bread, rice salads (Do not use   Onion salt) Orange Extract-Desserts, baked goods Oregano-Pasta, eggs, cheese, onions, pork, lamb, fish, chicken, vegetables, green salads Paprika-Meat, fish, poultry, eggs, cheese, vegetables Parsley Flakes-Butter, vegetables, meat fish, poultry, eggs, bread, salads (certain forms may   Contain sodium Pepper-Meat fish, poultry, vegetables, eggs Peppermint Extract-Desserts, baked goods Poppy Seed-Eggs, bread,  cheese, fruit dressings, baked goods, noodles, vegetables, cottage  [employer], poultry, meat, fish, cauliflower, turnips,eggs bread Saffron-Rice, bread, veal, chicken, fish, eggs Sage-Meat, fish, poultry, onions, eggplant, tomateos, pork, stews Savory-Eggs, salads, poultry, meat, rice, vegetables, soups, pork Tarragon-Meat, poultry, fish, eggs, butter, vegetables (licorice-like flavor)  Thyme-Meat, poultry, fish, eggs, vegetables, (clover-like flavor), sauces, soups Tumeric-Salads, butter, eggs, fish, rice, vegetables (saffron-like flavor) Vanilla Extract-Baked goods, candy Vinegar-Salads, vegetables, meat marinades Walnut Extract-baked goods, candy   2. Choose your Foods Wisely   The following is a list of foods to avoid which are high in sodium:  Meats-Avoid all smoked, canned, salt cured, dried and kosher meat and fish as well as Anchovies   [employer] meats:Bologna, Liverwurst, Pastrami Canned meat or fish  Marinated herring Caviar    Pepperoni Corned Beef   Pizza Dried chipped beef  Salami Frozen breaded fish or meat Salt pork Frankfurters or hot dogs  Sardines Gefilte fish   Sausage Ham (boiled ham, Proscuitto Smoked butt    spiced ham)   Spam      TV Dinners Vegetables Canned vegetables (Regular) Relish Canned mushrooms  Sauerkraut Olives    Tomato juice Pickles  Bakery and Dessert Products Canned puddings  Cream pies Cheesecake   Decorated cakes Cookies  Beverages/Juices Tomato juice, regular  Gatorade   V-8 vegetable juice, regular  Breads and Cereals Biscuit mixes   Salted potato chips, corn chips, pretzels Bread stuffing mixes  Salted crackers and rolls Pancake and waffle mixes Self-rising flour  Seasonings Accent    Meat sauces Barbecue sauce  Meat tenderizer Catsup    Monosodium glutamate (MSG) Celery salt   Onion salt Chili  sauce   Prepared mustard Garlic salt   Salt, seasoned salt, sea  salt Gravy mixes   Soy sauce Horseradish   Steak sauce Ketchup   Tartar sauce Lite salt    Teriyaki sauce Marinade mixes   Worcestershire sauce  Others Baking powder   Cocoa and cocoa mixes Baking soda   Commercial casserole mixes Candy-caramels, chocolate  Dehydrated soups    Bars, fudge,nougats  Instant rice and pasta mixes Canned broth or soup  Maraschino cherries Cheese, aged and processed cheese and cheese spreads  Learning Assessment Quiz  Indicated T (for True) or F (for False) for each of the following statements:  _____ Fresh fruits and vegetables and unprocessed grains are generally low in sodium _____ Water may contain a considerable amount of sodium, depending on the source _____ You can always tell if a food is high in sodium by tasting it _____ Certain laxatives my be high in sodium and should be avoided unless prescribed   by a physician or pharmacist _____ Salt substitutes may be used freely by anyone on a sodium restricted diet _____ Sodium is present in table salt, food additives and as a natural component of   most foods _____ Table salt is approximately 90% sodium _____ Limiting sodium intake may help prevent excess fluid accumulation in the body _____ On a sodium-restricted diet, seasonings such as bouillon soy sauce, and    cooking wine should be used in place of table salt _____ On an ingredient list, a product which lists monosodium glutamate as the first   ingredient is an appropriate food to include on a low sodium diet  Circle the best answer(s) to the following statements (Hint: there may be more than one correct answer)  11. On a low-sodium diet, some acceptable snack items are:    A. Olives  F. Bean dip   K. Grapefruit juice    B. Salted Pretzels G. Commercial Popcorn   L. Canned peaches    C. Carrot Sticks  H. Bouillon   M. Unsalted nuts   D. [place] fries  I. Peanut butter crackers N. Salami   E. Sweet pickles J. Tomato Juice   O. Pizza  12.   Seasonings that may be used freely on a reduced - sodium diet include   A. Lemon wedges F.Monosodium glutamate K. Celery seed    B.Soysauce   G. Pepper   L. Mustard powder   C. Sea salt  H. Cooking wine  M. Onion flakes   D. Vinegar  E. Prepared horseradish N. Salsa   E. Sage   J. Worcestershire sauce  O. Chutney

## 2021-06-24 ENCOUNTER — Telehealth: Payer: Self-pay

## 2021-06-24 NOTE — Telephone Encounter (Signed)
SW scheduled initial palliative care visit with patient. Visit is scheduled for 06/26/21 @12  pm.

## 2021-06-26 ENCOUNTER — Other Ambulatory Visit: Payer: Self-pay

## 2021-06-26 ENCOUNTER — Other Ambulatory Visit: Payer: 59 | Admitting: *Deleted

## 2021-06-26 ENCOUNTER — Other Ambulatory Visit: Payer: 59

## 2021-06-26 DIAGNOSIS — Z515 Encounter for palliative care: Secondary | ICD-10-CM

## 2021-06-26 NOTE — Progress Notes (Signed)
MISSED VISIT SW and RN-M. Roger Mooney arrived for a scheduled visit with patient. The team was advised by patient's son-in-law that he was at an appointment in [place]. SW left a palliative care folder and the team's business cards and requested that patient or his wife call back to reschedule missed visit.  No other concerns were noted.

## 2021-07-01 NOTE — Progress Notes (Signed)
[facility] COMMUNITY PALLIATIVE CARE RN NOTE  PATIENT NAME: Roger Mooney DOB: 1951-06-15 MRN: [id number]  PRIMARY CARE PROVIDER: Pcp, No  RESPONSIBLE PARTY:  Acct ID - Guarantor Home Phone Work Phone Relationship Acct Type  [id number] EBER, FERRUFINO* [phone]  Self P/F     [address], [place], [place] [address]    Palliative care visit was scheduled and confirmed with patient's daughter, Roger Mooney, for today at 12:00p. Myself and MSW arrived at patient's home and patient's son-in-law states that patient and daughter is currently in [place] and will not return until later today. Palliative care folder left for patient in the home along with our contact information.    Candiss Norse, RN BSN

## 2021-07-02 ENCOUNTER — Ambulatory Visit: Payer: 59 | Admitting: Internal Medicine

## 2021-07-22 ENCOUNTER — Ambulatory Visit: Payer: 59 | Admitting: Internal Medicine

## 2021-08-20 ENCOUNTER — Ambulatory Visit: Payer: 59 | Admitting: Internal Medicine

## 2021-08-20 ENCOUNTER — Telehealth: Payer: Self-pay | Admitting: Nurse Practitioner

## 2021-08-20 NOTE — Telephone Encounter (Signed)
I attempted to contact Roger Mooney to schedule f/u pc visit, no answer, message left to return call

## 2021-08-25 ENCOUNTER — Telehealth: Payer: Self-pay | Admitting: Nurse Practitioner

## 2021-08-25 NOTE — Telephone Encounter (Signed)
I called [name], Mr. Lolita Rieger daughter to schedule f/u pc visit, no answer, message left with contact information

## 2021-08-26 ENCOUNTER — Telehealth: Payer: Self-pay | Admitting: Nurse Practitioner

## 2021-08-26 NOTE — Telephone Encounter (Signed)
I have attempted to contact Roger Mooney x 3, unable to leave a message, I have attempted to contact son with number not in order. No other phone numbers available. Will d/c from pc, notify primary and have primary re-consult if patient/family wishes to have pc.

## 2021-10-08 ENCOUNTER — Ambulatory Visit: Payer: 59 | Admitting: Cardiology

## 2021-10-14 IMAGING — CT CT ANGIO CHEST
2 of 6 series · 17 of 46 positions shown · IV contrast (Omnipaque or Isovue)
Comparison: No priors.

CLINICAL DATA: 69-year-old male with history of central chest pain
and shortness of breath.

EXAM:
CT ANGIOGRAPHY CHEST WITH CONTRAST
TECHNIQUE: Multidetector CT imaging of the chest was performed using the
standard protocol during bolus administration of intravenous
contrast. Multiplanar CT image reconstructions and MIPs were
obtained to evaluate the vascular anatomy.
CONTRAST:  100mL OMNIPAQUE IOHEXOL 350 MG/ML SOLN

[Series 5: pe axial thins · axial · 0.68mm/px · z∈[-456,-180]mm · 14 of 376 slices shown]
[im 16/376  lung]
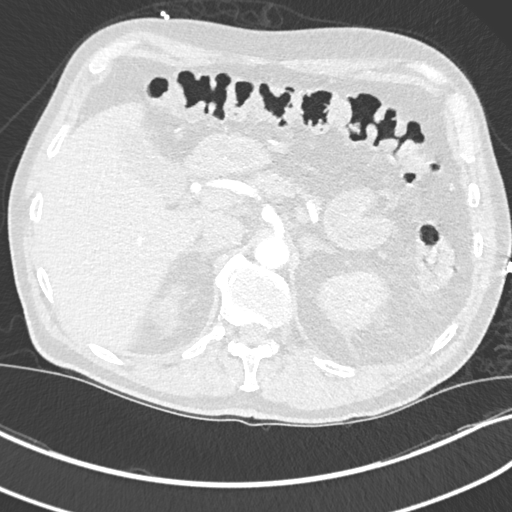
[im 47/376  soft-tissue]
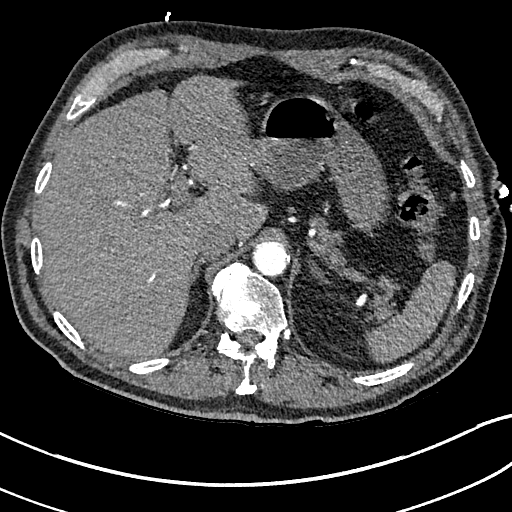
[im 79/376  lung]
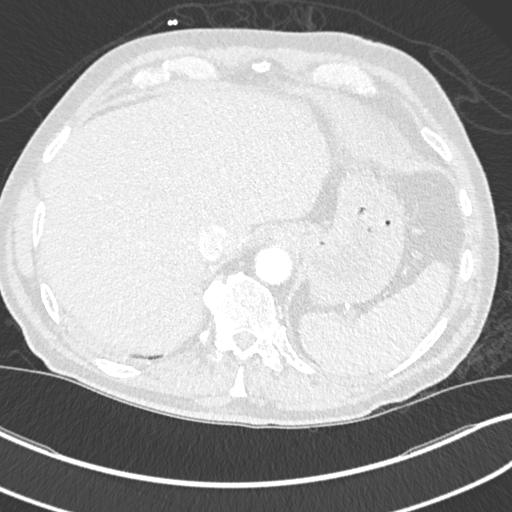
[im 94/376  soft-tissue]
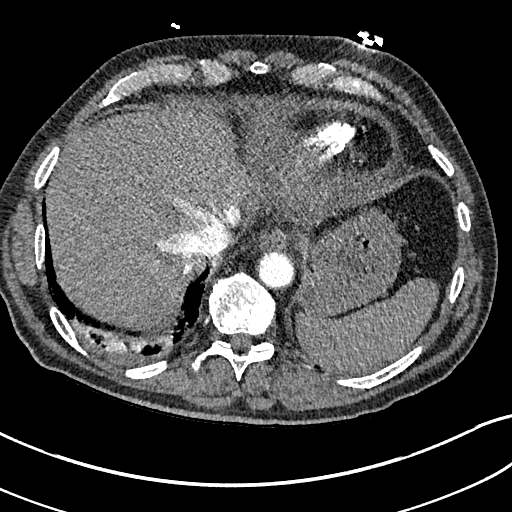
[im 126/376  lung]
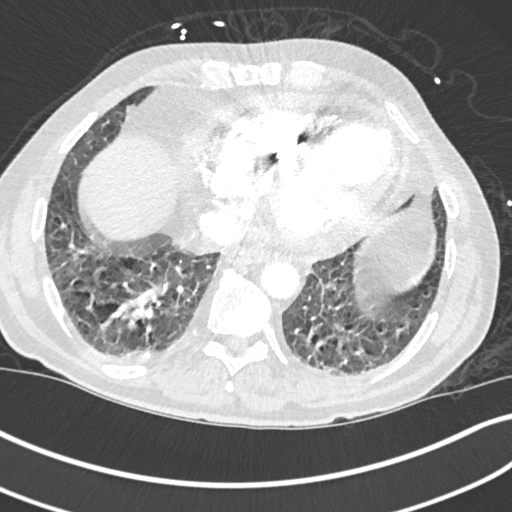
[im 157/376  soft-tissue]
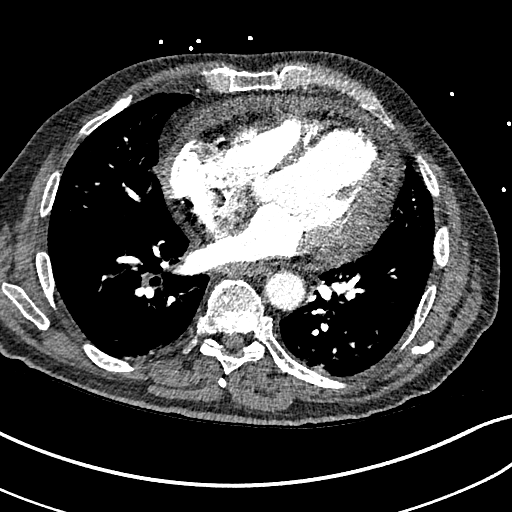
[im 172/376  lung]
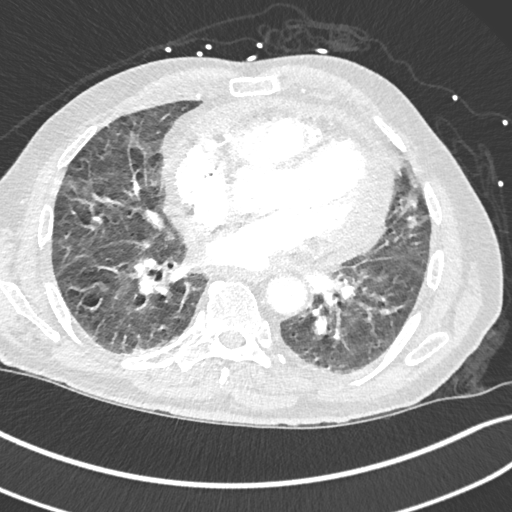
[im 204/376  soft-tissue]
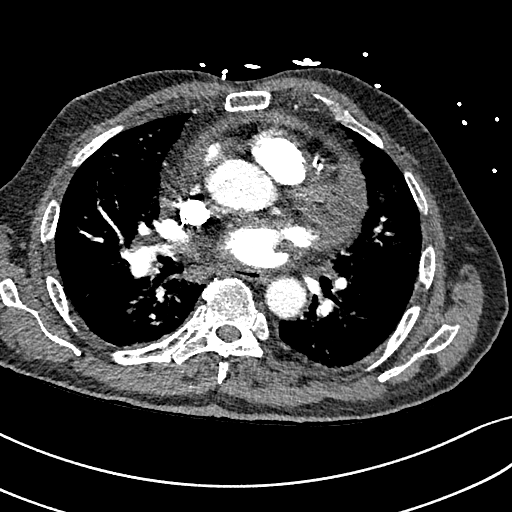
[im 219/376  lung]
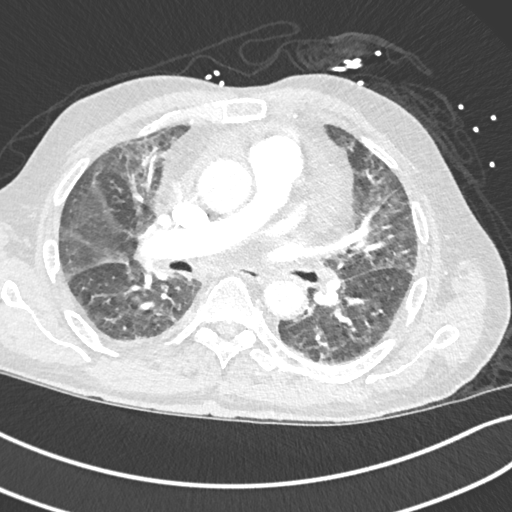
[im 251/376  soft-tissue]
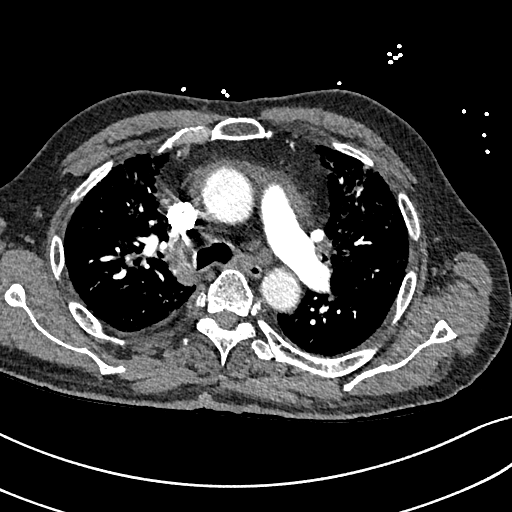
[im 282/376  lung]
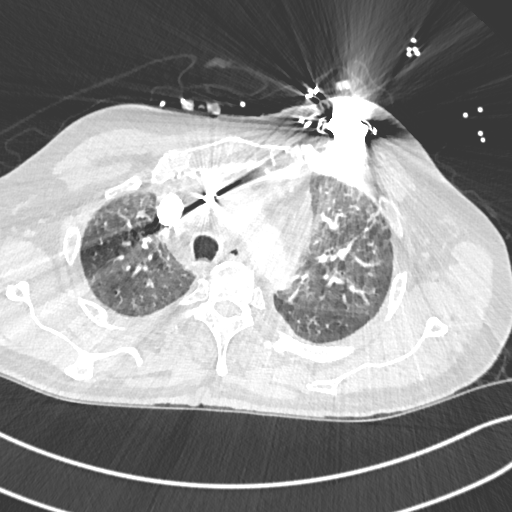
[im 297/376  soft-tissue]
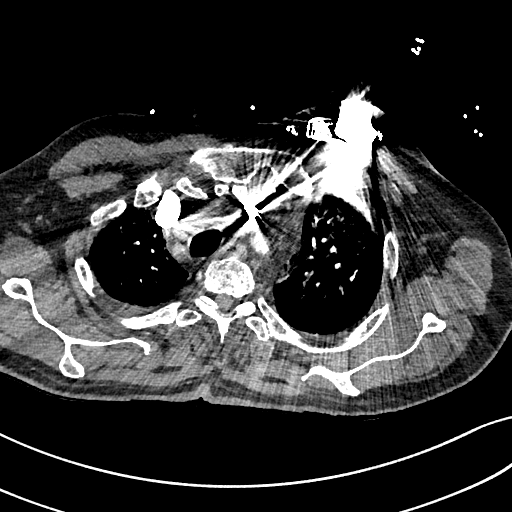
[im 329/376  lung]
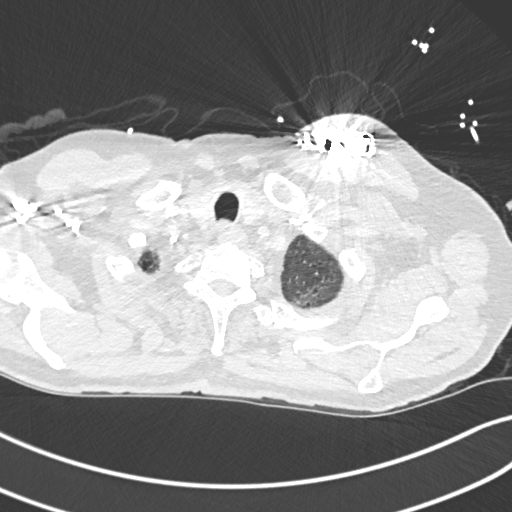
[im 360/376  soft-tissue]
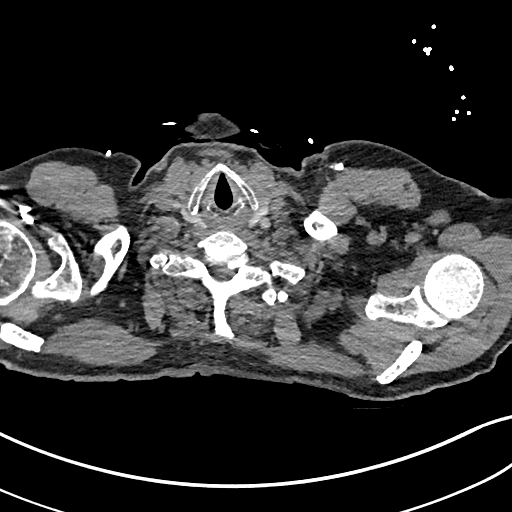

[Series 7: cor soft · coronal · 0.64mm/px · 3 of 150 slices shown]
[im 38/150  soft-tissue]
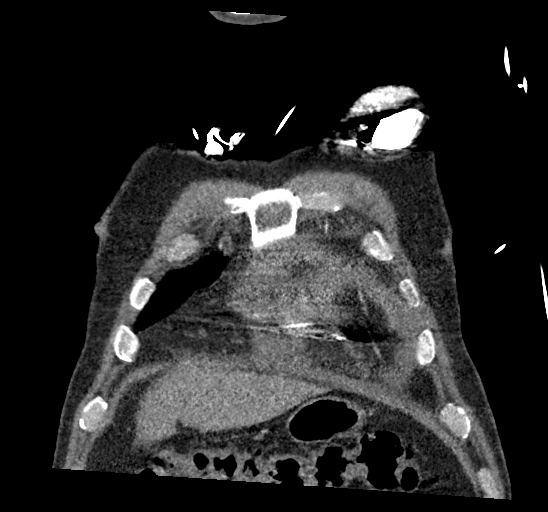
[im 75/150  soft-tissue]
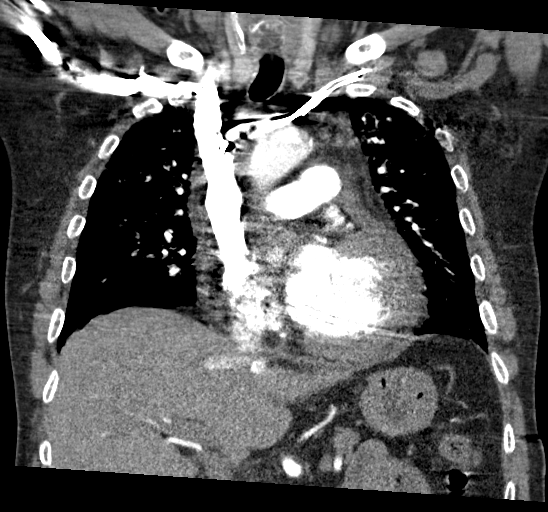
[im 112/150  soft-tissue]
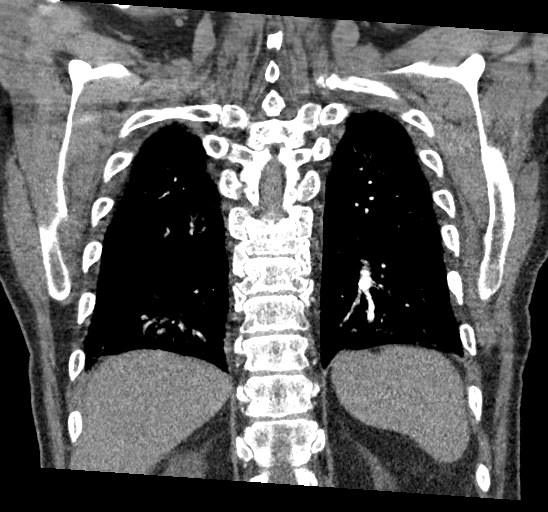

[17 of 46 positions shown; findings below may reference images not displayed]

FINDINGS: Cardiovascular: No filling defects within the pulmonary arterial to
suggest pulmonary emboli. Heart size is normal. Extensive
pericardial thickening and small volume of pericardial fluid. No
pericardial calcification. There is aortic atherosclerosis, as well
as atherosclerosis of the great vessels of the mediastinum and the
coronary arteries, including calcified atherosclerotic plaque in the
left main, left anterior descending, left circumflex and right
coronary arteries. Proximal left anterior descending coronary artery
stent. Left-sided pacemaker device in place with lead tips
terminating in the right atrium and right ventricular apex.

Mediastinum/Nodes: No pathologically enlarged mediastinal or hilar
lymph nodes. Esophagus is unremarkable in appearance. No axillary
lymphadenopathy.

Lungs/Pleura: Study is limited by extensive patient respiratory
motion. With these limitations in mind, there are no definite
suspicious appearing pulmonary nodules or masses are noted. Patchy
areas of ground-glass attenuation and septal thickening with
thickening of the peribronchovascular interstitium and regional
architectural distortion, most evident throughout the mid to upper
lungs, concerning for potential interstitial lung disease such as
hypersensitivity pneumonitis. No acute consolidative airspace
disease. Trace right pleural effusion. No left pleural effusion.

Upper Abdomen: Aortic atherosclerosis.

Musculoskeletal: There are no aggressive appearing lytic or blastic
lesions noted in the visualized portions of the skeleton.

Review of the MIP images confirms the above findings.
IMPRESSION: 1. No evidence of pulmonary embolism.
2. Extensive pericardial thickening and small volume of pericardial
fluid. Clinical correlation for signs and symptoms of acute
pericarditis is recommended. No pericardial calcification noted at
this time.
3. Trace right pleural effusion.
4. The appearance of the lungs is suggestive of potential
interstitial lung disease such as chronic hypersensitivity
pneumonitis. Follow-up nonemergent high-resolution chest CT is
recommended in 6 months to assess for temporal changes in the
appearance of the lung parenchyma. Additionally, nonemergent
outpatient referral to Pulmonology is suggested in the near future
for further clinical evaluation.
5. Aortic atherosclerosis, in addition to left main and 3 vessel
coronary artery disease. Please note that although the presence of
coronary artery calcium documents the presence of coronary artery
disease, the severity of this disease and any potential stenosis
cannot be assessed on this non-gated CT examination. Assessment for
potential risk factor modification, dietary therapy or pharmacologic
therapy may be warranted, if clinically indicated.

Aortic Atherosclerosis ([id number]-G1Z.Z).

## 2021-10-16 IMAGING — DX DG CHEST 1V PORT
1 series · 1 of 1 positions shown · non-contrast
Comparison: Chest x-ray 05/12/2021.

CLINICAL DATA: PICC line placement.

EXAM:
PORTABLE CHEST 1 VIEW

[chest ap]
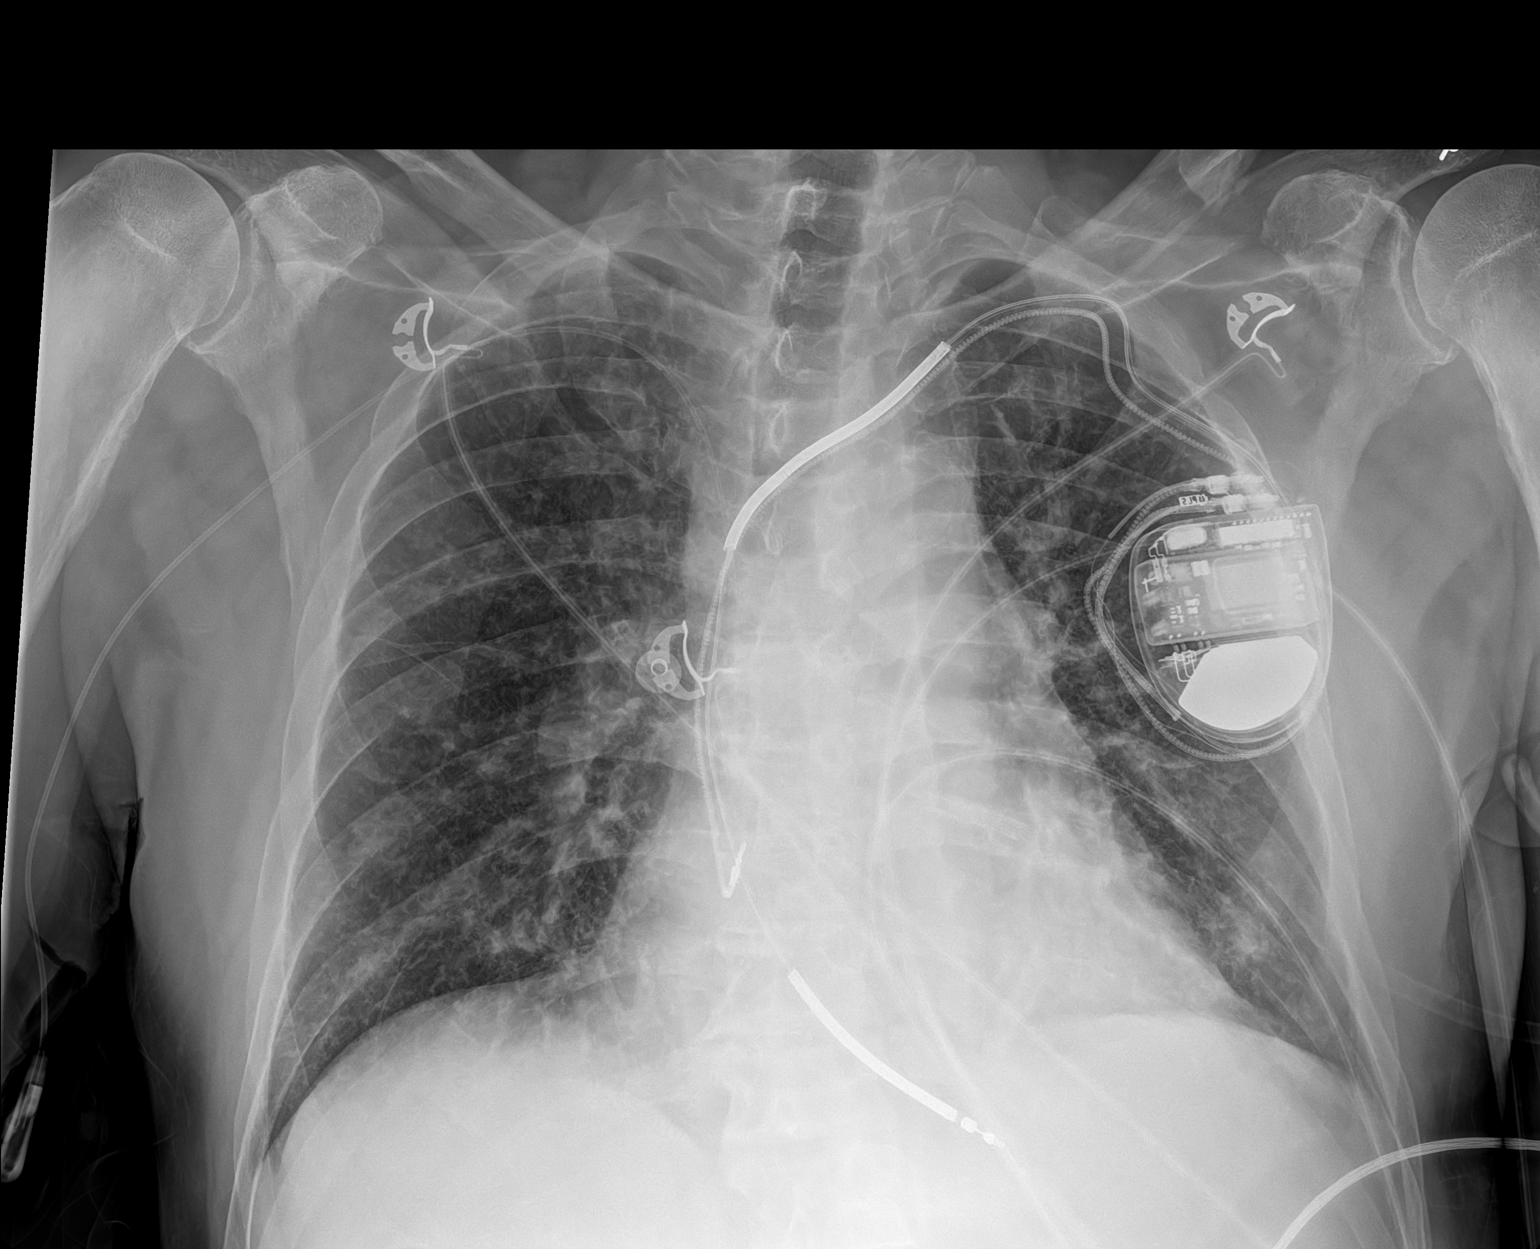

[1 of 1 positions shown; findings below may reference images not displayed]

FINDINGS: PICC line noted with tip over SVC. Cardiac pacer noted with lead
tips in right atrium right ventricle in stable position. Stable
cardiomegaly. Mild bilateral interstitial prominence, improved from
prior exam. Findings suggest improving interstitial edema and or
pneumonitis. No pleural effusion or pneumothorax. Carotid vascular
calcification cannot be excluded.
IMPRESSION: 1.  PICC line noted with tip over SVC.

2.  Cardiac pacer stable position.  Stable cardiomegaly.

3. Mild bilateral interstitial prominence, improved from prior exam.
Findings suggest improving interstitial edema and or pneumonitis.

4.  Carotid vascular disease cannot be excluded.

## 2021-11-16 IMAGING — DX DG CHEST 1V
1 series · 1 of 1 positions shown · non-contrast
Comparison: 05/14/2021

CLINICAL DATA: Chest pain and confusion

EXAM:
CHEST  1 VIEW

[chest ap]
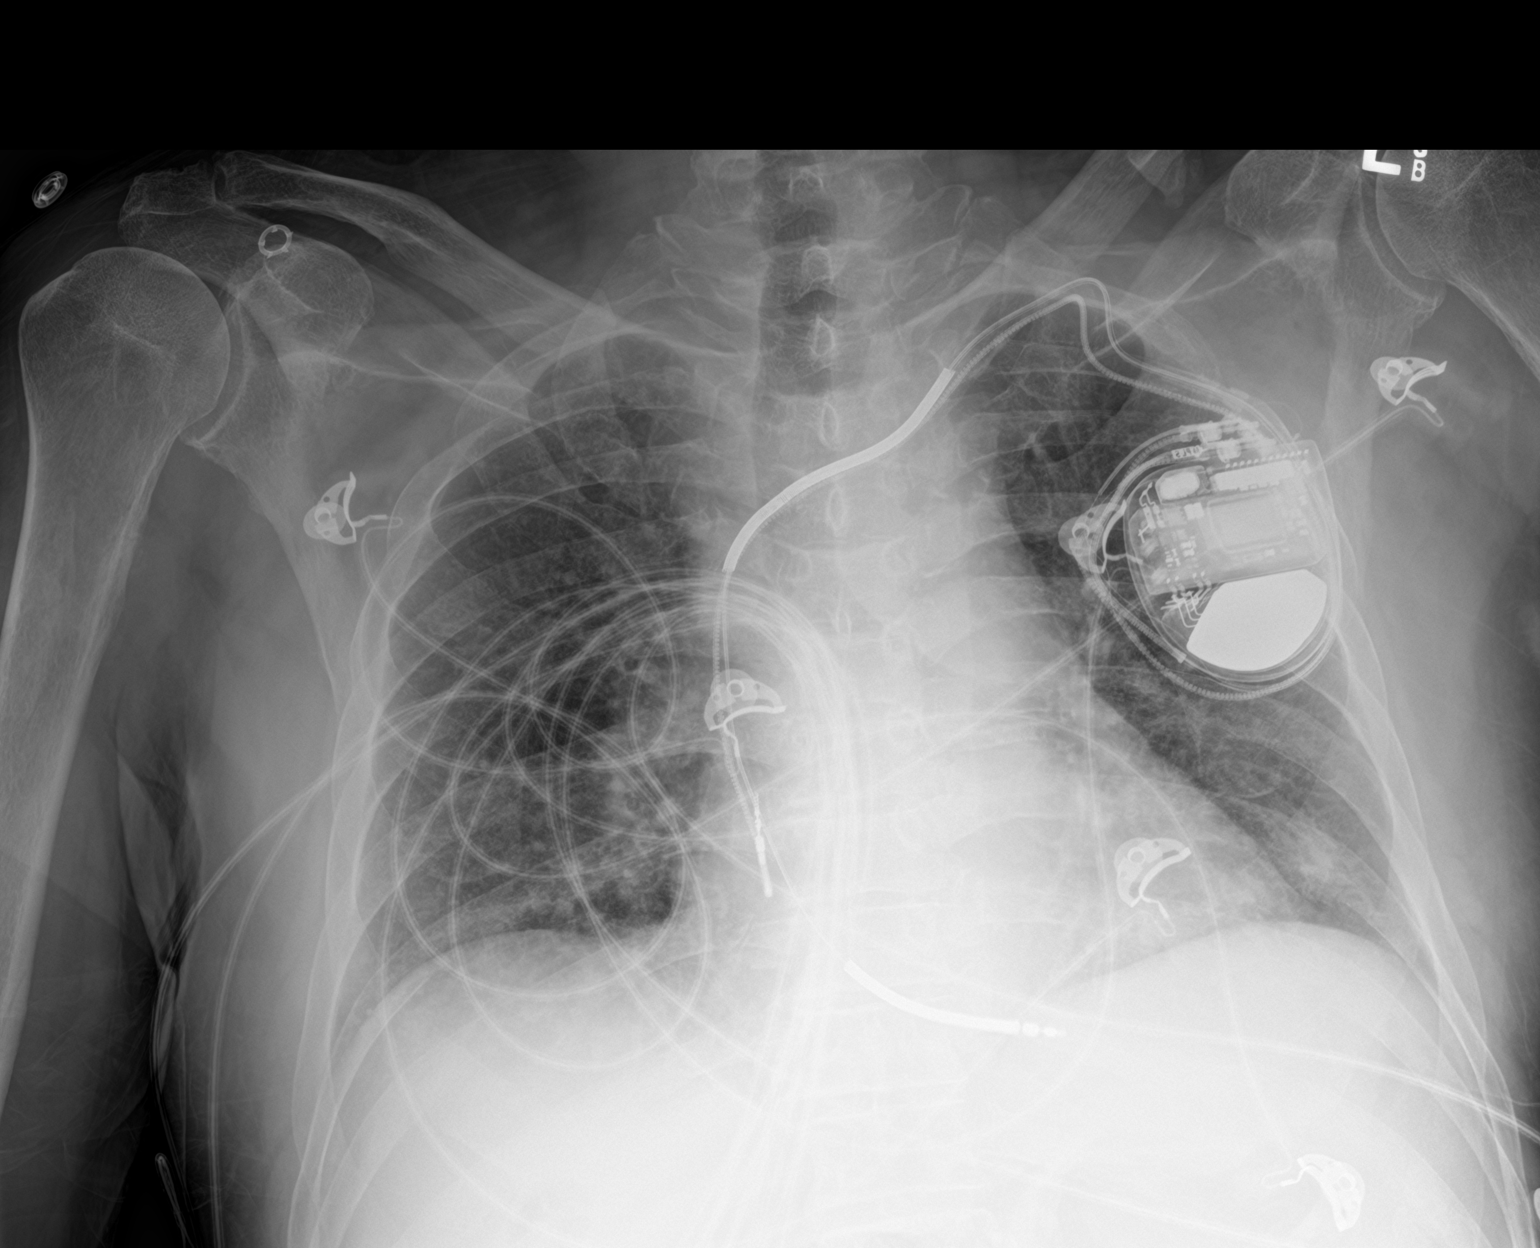

[1 of 1 positions shown; findings below may reference images not displayed]

FINDINGS: Stable appearance of the AICD. Low lung volumes are present, causing
crowding of the pulmonary vasculature. Upper normal size of the
cardiac shadow. Suspected mild lingular scarring, unchanged. Subtle
accentuation of the interstitium, unchanged. No overt edema. No
blunting of the costophrenic angles.
IMPRESSION: 1. Persistent low lung volumes, with some stable faint opacity at
the left lung base likely representing scarring in the lingula.
Persistent fine interstitial accentuation, without overt edema.

## 2021-11-16 IMAGING — DX DG CHEST 1V PORT
1 series · 1 of 1 positions shown · non-contrast
Comparison: Chest radiograph earlier today.  Chest CT 05/12/2021

CLINICAL DATA: Central line.

EXAM:
PORTABLE CHEST 1 VIEW

[chest ap]
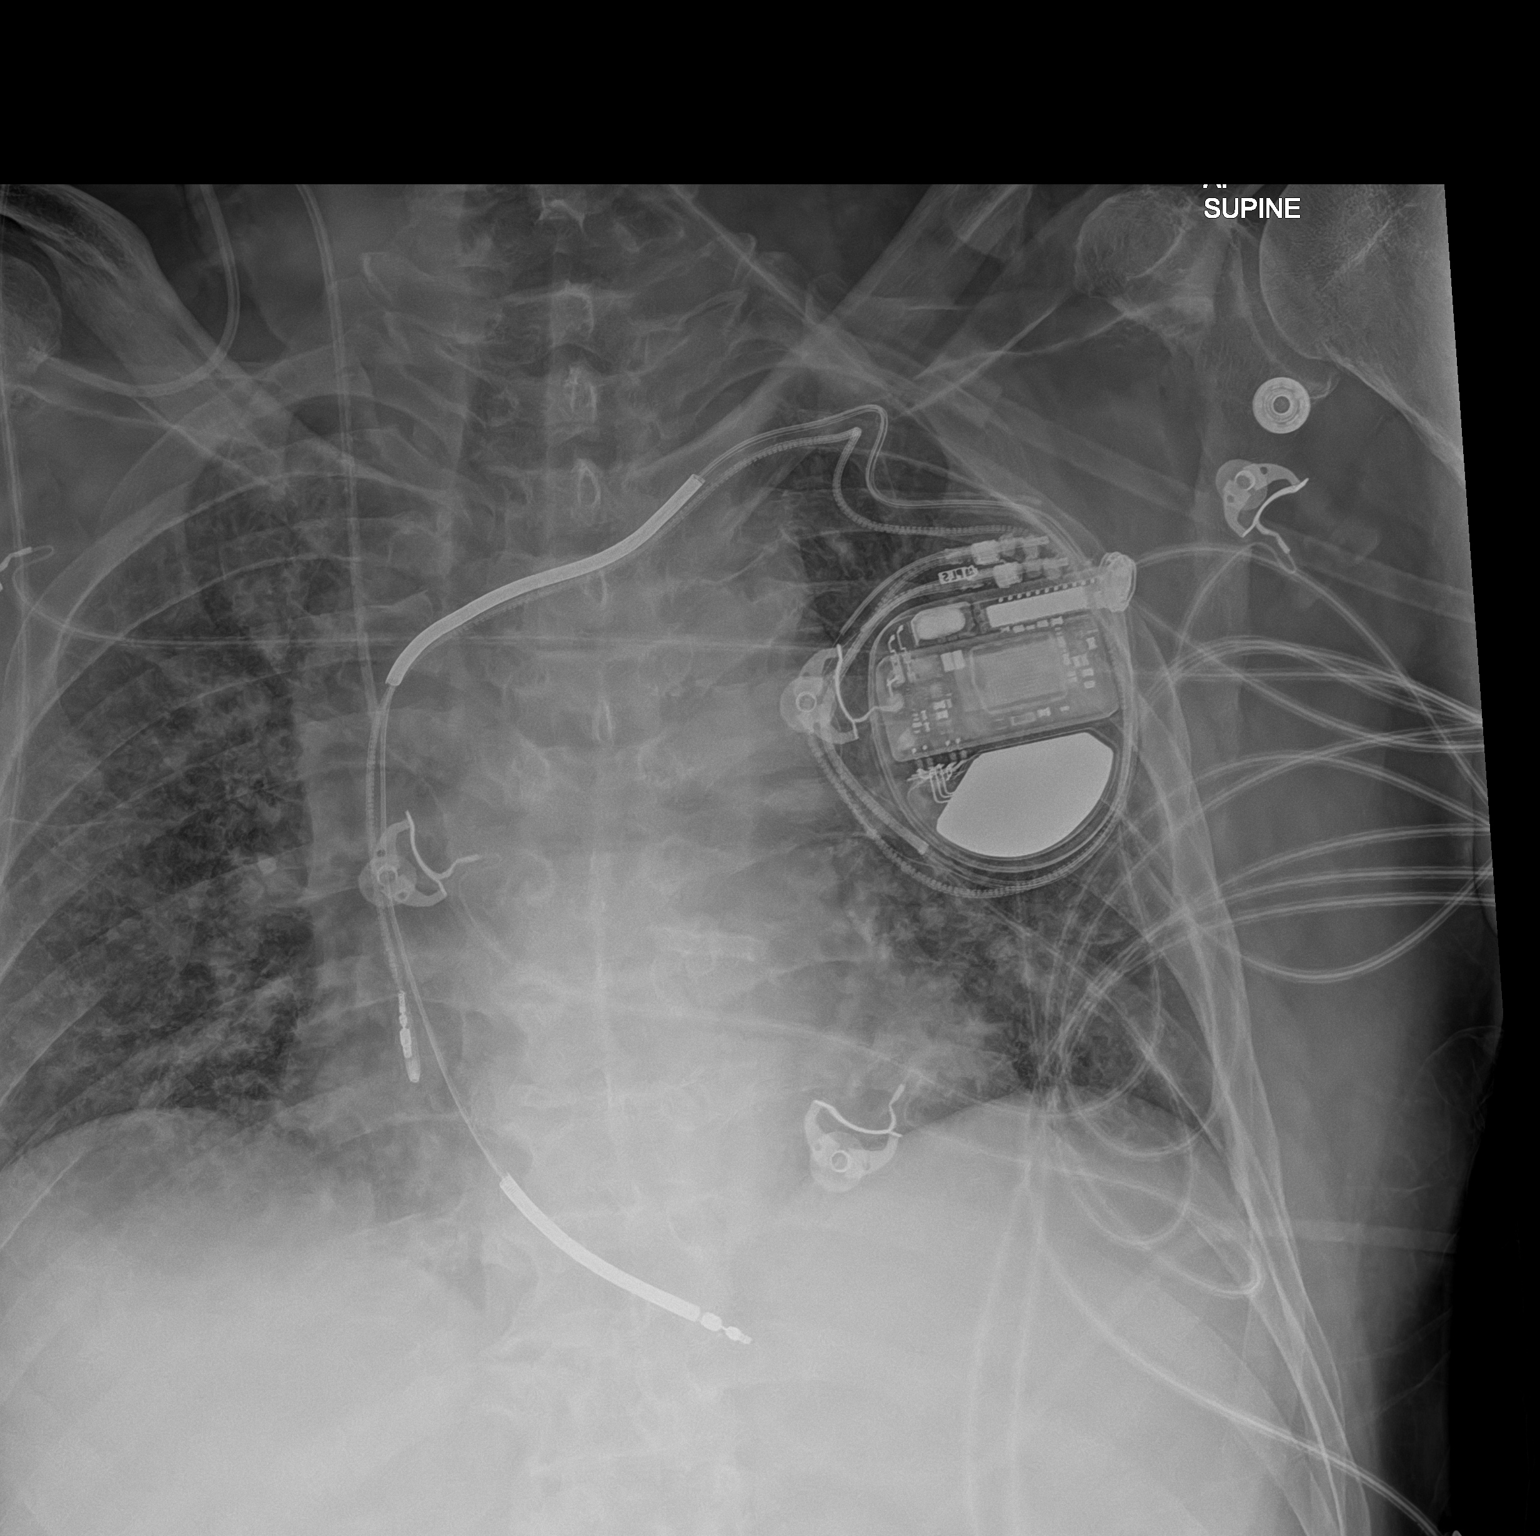

[1 of 1 positions shown; findings below may reference images not displayed]

FINDINGS: Lateral aspect of the right lower thorax inadvertently excluded from
the field of view. New right internal jugular central venous
catheter tip overlies the upper SVC. No evidence of pneumothorax,
left lateral chest not entirely included in the field of view.
Persistent low lung volumes. Left-sided pacemaker in place. Stable
cardiomegaly. Increasing interstitial thickening. No pleural
effusion.
IMPRESSION: 1. Tip of the right internal jugular central venous catheter
overlies the upper SVC. No evidence of pneumothorax.
2. Increasing interstitial thickening may be pulmonary edema or
atypical infection.
3. Stable cardiomegaly.
4. Lateral right hemithorax inadvertently excluded from the field of
view.

## 2022-09-11 ENCOUNTER — Emergency Department (HOSPITAL_COMMUNITY): Payer: Medicare Other

## 2022-09-11 ENCOUNTER — Encounter (HOSPITAL_COMMUNITY): Payer: Self-pay

## 2022-09-11 ENCOUNTER — Inpatient Hospital Stay (HOSPITAL_COMMUNITY)
Admission: EM | Admit: 2022-09-11 | Discharge: 2022-09-12 | DRG: 871 | Discharge status: Against Medical Advice | Payer: Medicare Other | Attending: Emergency Medicine | Admitting: Emergency Medicine

## 2022-09-11 ENCOUNTER — Other Ambulatory Visit: Payer: Self-pay

## 2022-09-11 DIAGNOSIS — Z20822 Contact with and (suspected) exposure to covid-19: Secondary | ICD-10-CM | POA: Diagnosis present

## 2022-09-11 DIAGNOSIS — N200 Calculus of kidney: Secondary | ICD-10-CM | POA: Diagnosis present

## 2022-09-11 DIAGNOSIS — Z7901 Long term (current) use of anticoagulants: Secondary | ICD-10-CM | POA: Diagnosis not present

## 2022-09-11 DIAGNOSIS — A419 Sepsis, unspecified organism: Secondary | ICD-10-CM | POA: Diagnosis present

## 2022-09-11 DIAGNOSIS — R579 Shock, unspecified: Secondary | ICD-10-CM

## 2022-09-11 DIAGNOSIS — I5022 Chronic systolic (congestive) heart failure: Secondary | ICD-10-CM | POA: Diagnosis present

## 2022-09-11 DIAGNOSIS — J9601 Acute respiratory failure with hypoxia: Secondary | ICD-10-CM | POA: Diagnosis present

## 2022-09-11 DIAGNOSIS — Z7984 Long term (current) use of oral hypoglycemic drugs: Secondary | ICD-10-CM

## 2022-09-11 DIAGNOSIS — I130 Hypertensive heart and chronic kidney disease with heart failure and stage 1 through stage 4 chronic kidney disease, or unspecified chronic kidney disease: Secondary | ICD-10-CM | POA: Diagnosis present

## 2022-09-11 DIAGNOSIS — I5021 Acute systolic (congestive) heart failure: Secondary | ICD-10-CM | POA: Diagnosis present

## 2022-09-11 DIAGNOSIS — E875 Hyperkalemia: Secondary | ICD-10-CM | POA: Diagnosis present

## 2022-09-11 DIAGNOSIS — I252 Old myocardial infarction: Secondary | ICD-10-CM | POA: Diagnosis not present

## 2022-09-11 DIAGNOSIS — I2510 Atherosclerotic heart disease of native coronary artery without angina pectoris: Secondary | ICD-10-CM | POA: Diagnosis present

## 2022-09-11 DIAGNOSIS — F17210 Nicotine dependence, cigarettes, uncomplicated: Secondary | ICD-10-CM | POA: Diagnosis present

## 2022-09-11 DIAGNOSIS — Z66 Do not resuscitate: Secondary | ICD-10-CM | POA: Diagnosis present

## 2022-09-11 DIAGNOSIS — R571 Hypovolemic shock: Secondary | ICD-10-CM | POA: Diagnosis present

## 2022-09-11 DIAGNOSIS — E1122 Type 2 diabetes mellitus with diabetic chronic kidney disease: Secondary | ICD-10-CM | POA: Diagnosis present

## 2022-09-11 DIAGNOSIS — Z79899 Other long term (current) drug therapy: Secondary | ICD-10-CM | POA: Diagnosis not present

## 2022-09-11 DIAGNOSIS — E861 Hypovolemia: Secondary | ICD-10-CM | POA: Diagnosis present

## 2022-09-11 DIAGNOSIS — I959 Hypotension, unspecified: Secondary | ICD-10-CM

## 2022-09-11 DIAGNOSIS — N179 Acute kidney failure, unspecified: Secondary | ICD-10-CM | POA: Diagnosis present

## 2022-09-11 DIAGNOSIS — N1831 Chronic kidney disease, stage 3a: Secondary | ICD-10-CM | POA: Diagnosis present

## 2022-09-11 DIAGNOSIS — J189 Pneumonia, unspecified organism: Principal | ICD-10-CM

## 2022-09-11 DIAGNOSIS — N170 Acute kidney failure with tubular necrosis: Secondary | ICD-10-CM | POA: Diagnosis not present

## 2022-09-11 HISTORY — DX: Atherosclerotic heart disease of native coronary artery without angina pectoris: I25.10

## 2022-09-11 HISTORY — DX: Heart failure, unspecified: I50.9

## 2022-09-11 LAB — COMPREHENSIVE METABOLIC PANEL
ALT: 18 U/L (ref 0–44)
AST: 30 U/L (ref 15–41)
Albumin: 4 g/dL (ref 3.5–5.0)
Alkaline Phosphatase: 59 U/L (ref 38–126)
Anion gap: 11 (ref 5–15)
BUN: 50 mg/dL — ABNORMAL HIGH (ref 8–23)
CO2: 18 mmol/L — ABNORMAL LOW (ref 22–32)
Calcium: 8.8 mg/dL — ABNORMAL LOW (ref 8.9–10.3)
Chloride: 108 mmol/L (ref 98–111)
Creatinine, Ser: 2.79 mg/dL — ABNORMAL HIGH (ref 0.61–1.24)
GFR, Estimated: 24 mL/min — ABNORMAL LOW (ref >60)
Glucose, Bld: 93 mg/dL (ref 70–99)
Potassium: 5.5 mmol/L — ABNORMAL HIGH (ref 3.5–5.1)
Sodium: 137 mmol/L (ref 135–145)
Total Bilirubin: 2.3 mg/dL — ABNORMAL HIGH (ref 0.3–1.2)
Total Protein: 7.6 g/dL (ref 6.5–8.1)

## 2022-09-11 LAB — CBC WITH DIFFERENTIAL/PLATELET
Abs Immature Granulocytes: 0.12 10*3/uL — ABNORMAL HIGH (ref 0.00–0.07)
Basophils Absolute: 0.1 10*3/uL (ref 0.0–0.1)
Basophils Relative: 1 %
Eosinophils Absolute: 0.1 10*3/uL (ref 0.0–0.5)
Eosinophils Relative: 1 %
HCT: 45 % (ref 39.0–52.0)
Hemoglobin: 14.3 g/dL (ref 13.0–17.0)
Immature Granulocytes: 1 %
Lymphocytes Relative: 14 %
Lymphs Abs: 2.8 10*3/uL (ref 0.7–4.0)
MCH: 27.6 pg (ref 26.0–34.0)
MCHC: 31.8 g/dL (ref 30.0–36.0)
MCV: 86.7 fL (ref 80.0–100.0)
Monocytes Absolute: 1 10*3/uL (ref 0.1–1.0)
Monocytes Relative: 5 %
Neutro Abs: 16.1 10*3/uL — ABNORMAL HIGH (ref 1.7–7.7)
Neutrophils Relative %: 78 %
Platelets: 233 10*3/uL (ref 150–400)
RBC: 5.19 MIL/uL (ref 4.22–5.81)
RDW: 15.2 % (ref 11.5–15.5)
WBC: 20.2 10*3/uL — ABNORMAL HIGH (ref 4.0–10.5)
nRBC: 0 % (ref 0.0–0.2)

## 2022-09-11 LAB — LACTIC ACID, PLASMA
Lactic Acid, Venous: 1.8 mmol/L (ref 0.5–1.9)
Lactic Acid, Venous: 1.1 mmol/L (ref 0.5–1.9)

## 2022-09-11 LAB — I-STAT CHEM 8, ED
BUN: 58 mg/dL — ABNORMAL HIGH (ref 8–23)
Calcium, Ion: 1.09 mmol/L — ABNORMAL LOW (ref 1.15–1.40)
Chloride: 113 mmol/L — ABNORMAL HIGH (ref 98–111)
Creatinine, Ser: 3 mg/dL — ABNORMAL HIGH (ref 0.61–1.24)
Glucose, Bld: 91 mg/dL (ref 70–99)
HCT: 45 % (ref 39.0–52.0)
Hemoglobin: 15.3 g/dL (ref 13.0–17.0)
Potassium: 4.9 mmol/L (ref 3.5–5.1)
Sodium: 137 mmol/L (ref 135–145)
TCO2: 24 mmol/L (ref 22–32)

## 2022-09-11 LAB — BRAIN NATRIURETIC PEPTIDE: B Natriuretic Peptide: 57 pg/mL (ref 0.0–100.0)

## 2022-09-11 LAB — LIPASE, BLOOD: Lipase: 40 U/L (ref 11–51)

## 2022-09-11 LAB — TROPONIN I (HIGH SENSITIVITY)
Troponin I (High Sensitivity): 10 ng/L (ref <18)
Troponin I (High Sensitivity): 10 ng/L (ref <18)
Troponin I (High Sensitivity): 9 ng/L (ref <18)

## 2022-09-11 LAB — PROTIME-INR
INR: 1.1 (ref 0.8–1.2)
Prothrombin Time: 13.7 seconds (ref 11.4–15.2)

## 2022-09-11 MED ORDER — SODIUM CHLORIDE 0.9 % IV SOLN
1.0000 g | INTRAVENOUS | Status: AC
  Administered 2022-09-11: 1 g via INTRAVENOUS
  Filled 2022-09-11: qty 10

## 2022-09-11 MED ORDER — MIDODRINE HCL 5 MG PO TABS
5.0000 mg | ORAL_TABLET | ORAL | Status: AC
  Administered 2022-09-11: 5 mg via ORAL
  Filled 2022-09-11: qty 1

## 2022-09-11 MED ORDER — NOREPINEPHRINE 4 MG/250ML-% IV SOLN
0.0000 ug/min | INTRAVENOUS | Status: DC
  Administered 2022-09-12: 6.5 ug/min via INTRAVENOUS
  Filled 2022-09-11: qty 250

## 2022-09-11 MED ORDER — SODIUM CHLORIDE 0.9 % IV SOLN
500.0000 mg | INTRAVENOUS | Status: AC
  Administered 2022-09-11: 500 mg via INTRAVENOUS
  Filled 2022-09-11: qty 5

## 2022-09-11 MED ORDER — NOREPINEPHRINE 4 MG/250ML-% IV SOLN
INTRAVENOUS | Status: AC
  Administered 2022-09-11: 5 ug/kg/min
  Filled 2022-09-11: qty 250

## 2022-09-11 MED ORDER — ASPIRIN 81 MG PO CHEW
324.0000 mg | CHEWABLE_TABLET | ORAL | Status: AC
  Administered 2022-09-11: 324 mg via ORAL
  Filled 2022-09-11 (×2): qty 4

## 2022-09-11 MED ORDER — IOHEXOL 350 MG/ML SOLN
60.0000 mL | Freq: Once | INTRAVENOUS | Status: AC | PRN
  Administered 2022-09-11: 60 mL via INTRAVENOUS

## 2022-09-11 NOTE — ED Triage Notes (Signed)
BIB EMS from home- had 2 day old chicken and bacon, several hours later started vomiting. Arrives hypotensive and low SPO2. EMS est #20 LAC and 4mg  zofran given

## 2022-09-11 NOTE — ED Provider Notes (Signed)
[facility] EMERGENCY DEPARTMENT Provider Note   CSN: [id number] Arrival date & time: 09/11/22  1622     History {Add pertinent medical, surgical, social history, OB history to HPI:1} Chief Complaint  Patient presents with   Emesis    Roger Mooney is a 71 y.o. male.  HPI     Home Medications Prior to Admission medications   Medication Sig Start Date End Date Taking? Authorizing Provider  acetaminophen (TYLENOL) 325 MG tablet Take 2 tablets (650 mg total) by mouth every 6 (six) hours as needed for mild pain (or Fever >/= 101). 05/16/21   Shon Hale, MD  apixaban (ELIQUIS) 5 MG TABS tablet Take 1 tablet (5 mg total) by mouth 2 (two) times daily. 06/20/21   Ghimire, Werner Lean, MD  atorvastatin (LIPITOR) 40 MG tablet Take 1 tablet (40 mg total) by mouth daily. 05/16/21   Shon Hale, MD  colchicine 0.6 MG tablet Take 1 tablet (0.6 mg total) by mouth 2 (two) times daily. 05/16/21   Shon Hale, MD  furosemide (LASIX) 20 MG tablet Take 1 tablet (20 mg total) by mouth See admin instructions. Take Lasix 20 mg daily for at least 2 days if you gain more than 3 pounds in 1 day or more than 5 pounds in a week 05/16/21 05/16/22  Shon Hale, MD  gabapentin (NEURONTIN) 400 MG capsule Take 400 mg by mouth 3 (three) times daily. 03/19/21   [provider]  isosorbide mononitrate (IMDUR) 30 MG 24 hr tablet Take 0.5 tablets (15 mg total) by mouth daily. 06/23/21 09/21/21  Dyann Kief, PA-C  metFORMIN (GLUCOPHAGE) 500 MG tablet Take 1 tablet (500 mg total) by mouth 2 (two) times daily with a meal. 05/16/21   Emokpae, Courage, MD  metoprolol succinate (TOPROL-XL) 25 MG 24 hr tablet Take 0.5 tablets (12.5 mg total) by mouth daily. 05/17/21   Shon Hale, MD  midodrine (PROAMATINE) 5 MG tablet Take 1 tablet (5 mg total) by mouth 3 (three) times daily with meals. 06/20/21   Ghimire, Werner Lean, MD  pantoprazole (PROTONIX) 40 MG tablet Take 1 tablet (40 mg total) by mouth daily. 05/16/21    Shon Hale, MD  PARoxetine (PAXIL) 20 MG tablet Take 1 tablet (20 mg total) by mouth daily. 05/16/21   Shon Hale, MD  QUEtiapine (SEROQUEL) 25 MG tablet Take 25 mg by mouth at bedtime. 03/19/21   [provider]      Allergies    Patient has no known allergies.    Review of Systems   Review of Systems  Physical Exam Updated Vital Signs BP (!) 61/50 (BP Location: Left Arm)   Pulse 60   Temp (!) 97.5 F (36.4 C) (Oral)   Resp (!) 22   SpO2 (!) 84%  Physical Exam  ED Results / Procedures / Treatments   Labs (all labs ordered are listed, but only abnormal results are displayed) Labs Reviewed  COMPREHENSIVE METABOLIC PANEL  CBC WITH DIFFERENTIAL/PLATELET  PROTIME-INR  LIPASE, BLOOD  LACTIC ACID, PLASMA  LACTIC ACID, PLASMA  BRAIN NATRIURETIC PEPTIDE  I-STAT CHEM 8, ED  TROPONIN I (HIGH SENSITIVITY)    EKG EKG Interpretation  Date/Time:  Friday September 11 2022 16:37:37 EDT Ventricular Rate:  63 PR Interval:  162 QRS Duration: 92 QT Interval:  369 QTC Calculation: 375 R Axis:   -51 Text Interpretation: Sinus rhythm Atrial premature complex Probable left atrial enlargement LAD, consider left anterior fascicular block Probable anterior infarct, age indeterminate Lateral leads are also  involved Confirmed by Margaretmary Eddy [phone]) on 09/11/2022 5:05:18 PM  Radiology No results found.  Procedures Procedures  {Document cardiac monitor, telemetry assessment procedure when appropriate:1}  Medications Ordered in ED Medications  norepinephrine (LEVOPHED) 4-5 MG/250ML-% infusion SOLN (has no administration in time range)    ED Course/ Medical Decision Making/ A&P                           Medical Decision Making Amount and/or Complexity of Data Reviewed Labs: ordered. Radiology: ordered.   ***  {Document critical care time when appropriate:1} {Document review of labs and clinical decision tools ie heart score, Chads2Vasc2 etc:1}  {Document  your independent review of radiology images, and any outside records:1} {Document your discussion with family members, caretakers, and with consultants:1} {Document social determinants of health affecting pt's care:1} {Document your decision making why or why not admission, treatments were needed:1} Final Clinical Impression(s) / ED Diagnoses Final diagnoses:  None    Rx / DC Orders ED Discharge Orders     None

## 2022-09-12 ENCOUNTER — Encounter (HOSPITAL_COMMUNITY): Payer: Self-pay | Admitting: Critical Care Medicine

## 2022-09-12 DIAGNOSIS — R571 Hypovolemic shock: Secondary | ICD-10-CM

## 2022-09-12 DIAGNOSIS — N170 Acute kidney failure with tubular necrosis: Secondary | ICD-10-CM

## 2022-09-12 LAB — STREP PNEUMONIAE URINARY ANTIGEN: Strep Pneumo Urinary Antigen: NEGATIVE

## 2022-09-12 LAB — URINALYSIS, ROUTINE W REFLEX MICROSCOPIC
Bacteria, UA: NONE SEEN
Bilirubin Urine: NEGATIVE
Glucose, UA: NEGATIVE mg/dL
Hgb urine dipstick: NEGATIVE
Ketones, ur: NEGATIVE mg/dL
Nitrite: NEGATIVE
Protein, ur: NEGATIVE mg/dL
Specific Gravity, Urine: 1.024 (ref 1.005–1.030)
pH: 5 (ref 5.0–8.0)

## 2022-09-12 LAB — GLUCOSE, CAPILLARY: Glucose-Capillary: 166 mg/dL — ABNORMAL HIGH (ref 70–99)

## 2022-09-12 LAB — RESP PANEL BY RT-PCR (FLU A&B, COVID) ARPGX2
Influenza A by PCR: NEGATIVE
Influenza B by PCR: NEGATIVE
SARS Coronavirus 2 by RT PCR: NEGATIVE

## 2022-09-12 LAB — MRSA NEXT GEN BY PCR, NASAL: MRSA by PCR Next Gen: NOT DETECTED

## 2022-09-12 LAB — CBG MONITORING, ED: Glucose-Capillary: 141 mg/dL — ABNORMAL HIGH (ref 70–99)

## 2022-09-12 MED ORDER — DOCUSATE SODIUM 100 MG PO CAPS: 100.0000 mg | ORAL_CAPSULE | Freq: Two times a day (BID) | ORAL | Status: DC | PRN

## 2022-09-12 MED ORDER — POLYETHYLENE GLYCOL 3350 17 G PO PACK: 17.0000 g | PACK | Freq: Every day | ORAL | Status: DC | PRN

## 2022-09-12 MED ORDER — LACTATED RINGERS IV BOLUS
1000.0000 mL | Freq: Once | INTRAVENOUS | Status: AC
  Administered 2022-09-12: 1000 mL via INTRAVENOUS

## 2022-09-12 MED ORDER — SODIUM CHLORIDE 0.9 % IV SOLN
250.0000 mL | INTRAVENOUS | Status: DC
  Administered 2022-09-12: 250 mL via INTRAVENOUS

## 2022-09-12 MED ORDER — NOREPINEPHRINE 4 MG/250ML-% IV SOLN: 2.0000 ug/min | INTRAVENOUS | Status: DC

## 2022-09-12 MED ORDER — CHLORHEXIDINE GLUCONATE CLOTH 2 % EX PADS: 6.0000 | MEDICATED_PAD | Freq: Every day | CUTANEOUS | Status: DC

## 2022-09-12 NOTE — Discharge Instructions (Signed)
You were admitted to the hospital with shock, presumably due to dehydration.  This was associated with acute renal failure.  It has been recommended that you stay to receive care but you are leaving [facility].  Medication list has been adjusted until you can follow-up with [facility] heart care in [place].  Please arrange to see them as soon as possible.  Temporarily hold your lisinopril, Bumex, Lasix and metoprolol as per discharge instructions.

## 2022-09-12 NOTE — Progress Notes (Signed)
Pt escorted out via security and left AMA. Belongings returned to patient except marijuana.

## 2022-09-12 NOTE — H&P (Signed)
NAME:  Roger Mooney, MRN:  [id number], DOB:  1951/09/09, LOS: 1 ADMISSION DATE:  09/11/2022, CONSULTATION DATE:  9/29 REFERRING MD:  Philip Aspen, CHIEF COMPLAINT:  emesis, chest pain, SOB  History of Present Illness:  Roger Mooney is an 71 y.o. M who presented to the [facility] ED on 9/29 with a chief complaint vomiting, chest pain, shortness of breath.  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  Per chart review, patient was brought in by EMS after patient had eaten a 46-day-old chicken and bacon which resulted in nonbilious vomiting.  After that episode he developed chest pain, shortness of breath, diaphoresis.  Denies recent fevers, cough.  On presentation to the emergency department SPO2 was found to be 84% on room air, BP 61/50.  WBC 20.2.  Creatinine 2.79, BUN 50.  CTA negative for PE, bilateral question tree-in-bud appearing infiltrates.  Troponin 10 >10 > 9.  BNP 57.  Lactate 1.1.  Patient was started on peripheral Levophed.  Oxygenation improved on 6 L nasal cannula.  Cultures were ordered.  Patient was started on ceftriaxone and azithromycin.    PCCM was consulted for transfer admission.  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  Significant Hospital Events: Including procedures, antibiotic start and stop dates in addition to other pertinent events   CT-PA 9/29 >> mild pretracheal lymphadenopathy, moderate to severe diffuse tree-in-bud infiltrates without consolidation, subcentimeter nonobstructing left renal calculi  Interim History / Subjective:  Patient without significant cough He does have some left upper back and chest discomfort Quite frustrated because his THC was just confiscated by security  Objective   Blood  pressure 92/62, pulse (!) 59, temperature 97.6 F (36.4 C), temperature source Oral, resp. rate 20, SpO2 92 %.        Intake/Output Summary (Last 24 hours) at 09/12/2022 0529 Last data filed at 09/12/2022 0511 Gross per 24 hour  Intake 644.13 ml  Output 500 ml  Net 144.13 ml   There were no vitals filed for this visit.  Examination: General: Thin man sitting up in bed.  Smells of cannabis HENT: Oropharynx dry, pupils equal Lungs: Clear bilaterally Cardiovascular: Regular, distant, no murmur Abdomen: Thin, nondistended with positive bowel sounds Extremities: No edema Neuro: Awake, alert, interacting.  He is very frustrated, does not want to be in the hospital.  Moves all extremities GU: Deferred  Resolved Hospital Problem list     Assessment & Plan:  Shock undifferentiated. Suspect due to hypovolemia (emesis, poor PO intake) superimposed on known systolic dysfxn. Could consider sepsis, unclear source. His tree/bud opacities are likely chronic and potentially related to atypical mycobacterial disease, would be unlikely to cause septic shock. Patient with emesis prior to arrival.  WBC 20.2.  BP 61/50 on arrival.  AKI.  Patient with history of EF of 20 to 25%. Troponin 10 >10 > 9.  BNP 57.  Lactate 1.1.  CT chest negative for PE.  Reports of eating 56-day-old chicken and bacon prior to emesis.  Question food poisoning with hypovolemic component.  Appearance of bilateral infiltrates noted however somewhat similar to previous CT scan in 2022.  Patient with no fluid resuscitation documented at [facility] emergency department. -Patient admitted on norepinephrine 5.  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  We will give bolus LR now and wean norepinephrine off as quickly as possible Telemetry monitoring -Treated empirically with ceftriaxone, azithromycin.  If he stays then we can continue empiric antibiotics and try to obtain respiratory  cultures -Midodrine ordered  Acute respiratory failure with hypoxia, now saturating adequately on room air Tree-in-bud opacities on CT chest, subacute.  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  Suspect secondary to prerenal hypovolemia.  Arrival potassium 5.5 > 4.9 on i-STAT -Acute renal failure, presumed due to hypovolemia.  Needs volume resuscitation, serial labs. Ensure adequate renal perfusion  DM 2 -Blood Glucose goal 140-180. -Sliding-scale insulin  ?DNR Goals of care Previous admission in 2022 seen by palliative care at [facility]  MOST form completed 06/18/2021.  Patient made a DNR, patient indicated limited additional interventions-use medical treatment, IV fluids and cardiac monitoring as indicated.  Do not use intubation or mechanical ventilation.  Transfer to hospital vent indicated.  Avoid intensive care.  Acute antibiotics.  Okay with IV fluids long-term -If he stays then he would benefit from palliative care consultation and discussions.  Disposition: I spoke with the patient regarding his current illness including marginal blood pressure (Levophed being weaned off) and especially he has acute renal failure.  He is at high risk for decompensation and further problems.  He understands that I have recommended that he stay in the hospital to receive appropriate  treatment.  Despite these recommendations he is insisting on leaving against my medical advice.  He is awake, alert and appears to understand the ramifications of his decision.  We will try to discharge him with as much support as is available.   Labs   CBC: Recent Labs  Lab 09/11/22 1735 09/11/22 1751  WBC 20.2*  --   NEUTROABS 16.1*  --   HGB 14.3 15.3  HCT 45.0 45.0  MCV 86.7  --   PLT 233  --     Basic Metabolic Panel: Recent Labs  Lab 09/11/22 1735 09/11/22 1751  NA 137 137  K 5.5* 4.9  CL 108 113*  CO2 18*  --   GLUCOSE 93 91  BUN 50* 58*  CREATININE 2.79* 3.00*  CALCIUM 8.8*  --    GFR: CrCl cannot be calculated (Unknown ideal weight.). Recent Labs  Lab 09/11/22 1735 09/11/22 1836  WBC 20.2*  --   LATICACIDVEN 1.8 1.1    Liver Function Tests: Recent Labs  Lab 09/11/22 1735  AST 30  ALT 18  ALKPHOS 59  BILITOT 2.3*  PROT 7.6  ALBUMIN 4.0   Recent Labs  Lab 09/11/22 1735  LIPASE 40   No results for input(s): "AMMONIA" in the last 168 hours.  ABG    Component Value Date/Time   PHART 7.308 (L) 06/15/2021 0510   PCO2ART 36.6 06/15/2021 0510   PO2ART 148 (H) 06/15/2021 0510   HCO3 18.3 (L) 06/15/2021 0510  TCO2 24 09/11/2022 1751   ACIDBASEDEF 7.0 (H) 06/15/2021 0510   O2SAT 65.1 06/15/2021 1121     Coagulation Profile: Recent Labs  Lab 09/11/22 1735  INR 1.1    Cardiac Enzymes: No results for input(s): "CKTOTAL", "CKMB", "CKMBINDEX", "TROPONINI" in the last 168 hours.  HbA1C: Hgb A1c MFr Bld  Date/Time Value Ref Range Status  06/14/2021 10:47 PM 6.0 (H) 4.8 - 5.6 % Final    Comment:    (NOTE) Pre diabetes:          5.7%-6.4%  Diabetes:              >6.4%  Glycemic control for   <7.0% adults with diabetes     CBG: No results for input(s): "GLUCAP" in the last 168 hours.  Review of Systems:   As per HPI  Past Medical History:  He,  has a past medical history of CHF (congestive heart failure) (HCC), Coronary artery  disease, Diabetes mellitus without complication (HCC), and Hypertension.   Surgical History:   Past Surgical History:  Procedure Laterality Date   PACEMAKER IMPLANT       Social History:   reports that he has been smoking cigarettes. He has never used smokeless tobacco. He reports that he does not currently use alcohol. He reports current drug use. Drug: Marijuana.   Family History:  His family history is not on file.   Allergies No Known Allergies   Home Medications  Prior to Admission medications   Medication Sig Start Date End Date Taking? Authorizing Provider  albuterol (VENTOLIN HFA) 108 (90 Base) MCG/ACT inhaler Inhale 2 puffs into the lungs every 4 (four) hours as needed for wheezing or shortness of breath. 08/06/22  Yes [provider]  apixaban (ELIQUIS) 5 MG TABS tablet Take 1 tablet (5 mg total) by mouth 2 (two) times daily. 06/20/21  Yes Ghimire, Werner Lean, MD  atorvastatin (LIPITOR) 40 MG tablet Take 1 tablet (40 mg total) by mouth daily. 05/16/21  Yes Emokpae, Courage, MD  bumetanide (BUMEX) 1 MG tablet Take 0.5 mg by mouth daily. 08/06/22  Yes [provider]  colchicine 0.6 MG tablet Take 1 tablet (0.6 mg total) by mouth 2 (two) times daily. 05/16/21  Yes Emokpae, Courage, MD  furosemide (LASIX) 20 MG tablet Take 1 tablet (20 mg total) by mouth See admin instructions. Take Lasix 20 mg daily for at least 2 days if you gain more than 3 pounds in 1 day or more than 5 pounds in a week 05/16/21 09/11/22 Yes Emokpae, Courage, MD  gabapentin (NEURONTIN) 400 MG capsule Take 400 mg by mouth 3 (three) times daily. 03/19/21  Yes [provider]  isosorbide mononitrate (IMDUR) 30 MG 24 hr tablet Take 0.5 tablets (15 mg total) by mouth daily. 06/23/21 09/11/22 Yes Dyann Kief, PA-C  lisinopril (ZESTRIL) 5 MG tablet Take 5 mg by mouth daily. 08/06/22  Yes [provider]  metFORMIN (GLUCOPHAGE) 500 MG tablet Take 1 tablet (500 mg total) by mouth 2 (two) times  daily with a meal. 05/16/21  Yes Emokpae, Courage, MD  metoprolol succinate (TOPROL-XL) 25 MG 24 hr tablet Take 0.5 tablets (12.5 mg total) by mouth daily. 05/17/21  Yes Emokpae, Courage, MD  PARoxetine (PAXIL) 20 MG tablet Take 1 tablet (20 mg total) by mouth daily. 05/16/21  Yes Emokpae, Courage, MD  QUEtiapine (SEROQUEL) 25 MG tablet Take 25 mg by mouth at bedtime. 03/19/21  Yes [provider]  Dwyane Luo 100-62.5-25 MCG/ACT AEPB  Take 1 puff by mouth daily. 08/06/22  Yes [provider]  acetaminophen (TYLENOL) 325 MG tablet Take 2 tablets (650 mg total) by mouth every 6 (six) hours as needed for mild pain (or Fever >/= 101). 05/16/21   Shon Hale, MD  gabapentin (NEURONTIN) 300 MG capsule Take 300 mg by mouth 2 (two) times daily. Patient not taking: Reported on 09/11/2022 08/06/22   [provider]  midodrine (PROAMATINE) 5 MG tablet Take 1 tablet (5 mg total) by mouth 3 (three) times daily with meals. Patient not taking: Reported on 09/11/2022 06/20/21   Maretta Bees, MD  pantoprazole (PROTONIX) 40 MG tablet Take 1 tablet (40 mg total) by mouth daily. Patient not taking: Reported on 09/11/2022 05/16/21   Shon Hale, MD     Critical care time: 45 min     Levy Pupa, MD, PhD 09/12/2022, 11:09 AM [facility] [phone] or if no answer before 7:00PM call [phone] For any issues after 7:00PM please call eLink [phone]

## 2022-09-12 NOTE — Progress Notes (Signed)
Pt came to room from [facility] with pants, wallet, marijuana, marlboro cigarettes, lighter, shoes, alburterol inhaler. Charge nurse Thurmond Butts made aware of marijuana and security called and at bedside. Pt is desiring to leave AMA. Dr. Lamonte Sakai aware and at bedside

## 2022-09-12 NOTE — Discharge Summary (Signed)
Physician Discharge Summary  Patient ID: Roger Mooney MRN: [id number] DOB/AGE: [birth year] 71 y.o.  Admit date: 09/11/2022 Discharge date: 09/12/2022  Admission Diagnoses:  Discharge Diagnoses:  Principal Problem:   Shock [facility]) Active Problems:   Acute renal failure (ARF) ([place])   Discharged Condition: serious  Hospital Course:  Patient admitted with shock, presumably hypovolemic shock although possible component of sepsis, source identified.  Also with resultant acute renal failure.  Arrived on norepinephrine 5.  He insisted on leaving, disconnecting from medications despite medical advice.  He will be discharged AMA.  Consults: None  Significant Diagnostic Studies: CT chest from [facility]  Treatments: IV hydration and cardiac meds: Norepinephrine  Discharge Exam: Blood pressure 107/74, pulse (!) 58, temperature 98 F (36.7 C), temperature source Oral, resp. rate 10, height 5\' 11"  (1.803 m), weight 65.8 kg, SpO2 97 %. General: Thin man sitting up in bed.  Smells of cannabis HENT: Oropharynx dry, pupils equal Lungs: Clear bilaterally Cardiovascular: Regular, distant, no murmur Abdomen: Thin, nondistended with positive bowel sounds Extremities: No edema Neuro: Awake, alert, interacting.  He is very frustrated, does not want to be in the hospital.  Moves all extremities GU: Deferred  Disposition: Discharge disposition: 01-Home or Self Care       Discharge Instructions     Discharge patient   Complete by: As directed    Patient is being discharge d against medical advice   Discharge disposition: 01-Home or Self Care   Discharge patient date: 09/12/2022      Allergies as of 09/12/2022   No Known Allergies      Medication List     STOP taking these medications    bumetanide 1 MG tablet Commonly known as: BUMEX   furosemide 20 MG tablet Commonly known as: Lasix   lisinopril 5 MG tablet Commonly known as: ZESTRIL   metoprolol succinate 25 MG 24 hr  tablet Commonly known as: TOPROL-XL       TAKE these medications    acetaminophen 325 MG tablet Commonly known as: TYLENOL Take 2 tablets (650 mg total) by mouth every 6 (six) hours as needed for mild pain (or Fever >/= 101).   albuterol 108 (90 Base) MCG/ACT inhaler Commonly known as: VENTOLIN HFA Inhale 2 puffs into the lungs every 4 (four) hours as needed for wheezing or shortness of breath.   apixaban 5 MG Tabs tablet Commonly known as: ELIQUIS Take 1 tablet (5 mg total) by mouth 2 (two) times daily.   atorvastatin 40 MG tablet Commonly known as: LIPITOR Take 1 tablet (40 mg total) by mouth daily.   colchicine 0.6 MG tablet Take 1 tablet (0.6 mg total) by mouth 2 (two) times daily.   gabapentin 400 MG capsule Commonly known as: NEURONTIN Take 400 mg by mouth 3 (three) times daily. What changed: Another medication with the same name was removed. Continue taking this medication, and follow the directions you see here.   isosorbide mononitrate 30 MG 24 hr tablet Commonly known as: IMDUR Take 0.5 tablets (15 mg total) by mouth daily.   metFORMIN 500 MG tablet Commonly known as: GLUCOPHAGE Take 1 tablet (500 mg total) by mouth 2 (two) times daily with a meal.   midodrine 5 MG tablet Commonly known as: PROAMATINE Take 1 tablet (5 mg total) by mouth 3 (three) times daily with meals.   pantoprazole 40 MG tablet Commonly known as: PROTONIX Take 1 tablet (40 mg total) by mouth daily.   PARoxetine 20 MG tablet Commonly known as:  PAXIL Take 1 tablet (20 mg total) by mouth daily.   QUEtiapine 25 MG tablet Commonly known as: SEROQUEL Take 25 mg by mouth at bedtime.   Trelegy Ellipta 100-62.5-25 MCG/ACT Aepb Generic drug: Fluticasone-Umeclidin-Vilant Take 1 puff by mouth daily.        Follow-up Information     Imogene Burn, PA-C. Schedule an appointment as soon as possible for a visit in 1 week(s).   Specialty: Cardiology Contact information: [place] [address] [phone]                 Signed: Collene Gobble 09/12/2022, 11:30 AM

## 2022-09-17 LAB — CULTURE, BLOOD (ROUTINE X 2)
Culture: NO GROWTH
Culture: NO GROWTH
Special Requests: ADEQUATE
Special Requests: ADEQUATE

## 2022-09-25 ENCOUNTER — Ambulatory Visit: Payer: Medicare Other | Attending: Nurse Practitioner | Admitting: Nurse Practitioner

## 2022-09-25 NOTE — Progress Notes (Deleted)
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  He has a history CAD s/p remote MI and prior stenting to the LAD.  He has a history of ischemic cardiomyopathy, EF 25%, s/p ICD. He was hospitalized in 03/2021 with hypoxic respiratory failure and septic shock requiring intubation and Levophed.  He also had V. tach with ICD firing.  He was hospitalized again in 06/2021 in the setting of of severe E. coli sepsis sepsis, septic shock due to unintentional narcotic overdose-he had an ICD shock.  Echocardiogram showed small circumferential pericardial effusion.  He was treated with steroids and colchicine for possible pericarditis.  Echocardiogram in 06/2022 showed EF 20 to 25%, large apical thrombus (on Eliquis, history of nonadherence to Coumadin).  He was started on midodrine as well as carvedilol and Lasix.  He was not started on ACE inhibitor or Entresto due to history of CKD/AKI and nonadherence.  He was last seen in the office on 06/23/2021 and  was stable overall from a cardiac standpoint.  Titration of CHF medication was limited in the setting of hypotension.  Imdur was decreased to 15 mg daily.  He denied symptoms concerning for angina.  He has not been seen in follow-up since.  He presented to the ED on 09/11/2022 with hypovolemic shock of unclear source, small component of sepsis, with resultant acute renal failure.  He developed chest pain, shortness of breath, diaphoresis, and vomiting.  He was admitted on norepinephrine.  PCCM was consulted.  Despite recommendations, he left the hospital AMA.   He presents today for follow-up.  Since his hospitalization  Chronic systolic heart failure: CAD: Hypotension/shock: CKD stage III/AKI: LV thrombus: Type 2 diabetes: Polysubstance abuse: Disposition:   Home Medications    Current Outpatient Medications  Medication Sig Dispense Refill   acetaminophen (TYLENOL) 325 MG tablet Take 2 tablets (650 mg total) by mouth every 6 (six) hours as needed for mild pain (or Fever >/= 101). 12 tablet 0   albuterol (VENTOLIN HFA) 108 (90 Base) MCG/ACT inhaler Inhale 2 puffs into the lungs every 4 (four) hours as needed for wheezing or shortness of breath.     apixaban (ELIQUIS) 5 MG TABS tablet Take 1 tablet (5 mg total) by mouth 2 (two) times daily. 60 tablet 0   atorvastatin (LIPITOR) 40 MG tablet Take 1 tablet (40 mg total) by mouth daily. 30 tablet 4   colchicine 0.6 MG tablet Take 1 tablet (0.6 mg total) by mouth 2 (two) times daily. 60 tablet  3   gabapentin (NEURONTIN) 400 MG capsule Take 400 mg by mouth 3 (three) times daily.     isosorbide mononitrate (IMDUR) 30 MG 24 hr tablet Take 0.5 tablets (15 mg total) by mouth daily. 45 tablet 3   metFORMIN (GLUCOPHAGE) 500 MG tablet Take 1 tablet (500 mg total) by mouth 2 (two) times daily with a meal. 60 tablet 5   midodrine (PROAMATINE) 5 MG tablet Take 1 tablet (5 mg total) by mouth 3 (three) times daily with meals. (Patient not taking: Reported on  09/11/2022) 90 tablet 0   pantoprazole (PROTONIX) 40 MG tablet Take 1 tablet (40 mg total) by mouth daily. (Patient not taking: Reported on 09/11/2022) 30 tablet 3   PARoxetine (PAXIL) 20 MG tablet Take 1 tablet (20 mg total) by mouth daily. 30 tablet 4   QUEtiapine (SEROQUEL) 25 MG tablet Take 25 mg by mouth at bedtime.     TRELEGY ELLIPTA 100-62.5-25 MCG/ACT AEPB Take 1 puff by mouth daily.     No current facility-administered medications for this visit.     Review of Systems    ***.  All other systems reviewed and are otherwise negative except as noted above.    Physical Exam    VS:  There were no vitals taken for this visit. , BMI There is no height or weight on file to calculate BMI.     GEN: Well nourished, well developed, in no acute distress. HEENT: normal. Neck: Supple, no JVD, carotid bruits, or masses. Cardiac: RRR, no murmurs, rubs, or gallops. No clubbing, cyanosis, edema.  Radials/DP/PT 2+ and equal bilaterally.  Respiratory:  Respirations regular and unlabored, clear to auscultation bilaterally. GI: Soft, nontender, nondistended, BS + x 4. MS: no deformity or atrophy. Skin: warm and dry, no rash. Neuro:  Strength and sensation are intact. Psych: Normal affect.  Accessory Clinical Findings    ECG personally reviewed by me today - *** - no acute changes.   Lab Results  Component Value Date   WBC 20.2 (H) 09/11/2022   HGB 15.3 09/11/2022   HCT 45.0 09/11/2022   MCV 86.7 09/11/2022   PLT 233 09/11/2022   Lab Results  Component Value Date   CREATININE 3.00 (H) 09/11/2022   BUN 58 (H) 09/11/2022   NA 137 09/11/2022   K 4.9 09/11/2022   CL 113 (H) 09/11/2022   CO2 18 (L) 09/11/2022   Lab Results  Component Value Date   ALT 18 09/11/2022   AST 30 09/11/2022   ALKPHOS 59 09/11/2022   BILITOT 2.3 (H) 09/11/2022   No results found for: "CHOL", "HDL", "LDLCALC", "LDLDIRECT", "TRIG", "CHOLHDL"  Lab Results  Component Value Date   HGBA1C 6.0 (H) 06/14/2021     Assessment & Plan    1.  ***  No BP recorded.  {Refresh Note OR Click here to enter BP  :1}***   Lenna Sciara, NP 09/25/2022, 9:02 AM

## 2022-11-04 ENCOUNTER — Inpatient Hospital Stay (HOSPITAL_COMMUNITY)
Admission: EM | Admit: 2022-11-04 | Discharge: 2022-11-06 | DRG: 194 | Disposition: A | Discharge status: Home / Self Care | Payer: Medicare Other | Attending: Internal Medicine | Admitting: Internal Medicine

## 2022-11-04 ENCOUNTER — Other Ambulatory Visit: Payer: Self-pay

## 2022-11-04 ENCOUNTER — Encounter (HOSPITAL_COMMUNITY): Payer: Self-pay | Admitting: *Deleted

## 2022-11-04 ENCOUNTER — Emergency Department (HOSPITAL_COMMUNITY): Payer: Medicare Other

## 2022-11-04 DIAGNOSIS — I5022 Chronic systolic (congestive) heart failure: Secondary | ICD-10-CM | POA: Diagnosis not present

## 2022-11-04 DIAGNOSIS — J440 Chronic obstructive pulmonary disease with acute lower respiratory infection: Secondary | ICD-10-CM | POA: Diagnosis present

## 2022-11-04 DIAGNOSIS — Z7984 Long term (current) use of oral hypoglycemic drugs: Secondary | ICD-10-CM

## 2022-11-04 DIAGNOSIS — E119 Type 2 diabetes mellitus without complications: Secondary | ICD-10-CM | POA: Diagnosis present

## 2022-11-04 DIAGNOSIS — R17 Unspecified jaundice: Secondary | ICD-10-CM | POA: Diagnosis present

## 2022-11-04 DIAGNOSIS — N179 Acute kidney failure, unspecified: Secondary | ICD-10-CM | POA: Diagnosis present

## 2022-11-04 DIAGNOSIS — I2510 Atherosclerotic heart disease of native coronary artery without angina pectoris: Secondary | ICD-10-CM | POA: Diagnosis not present

## 2022-11-04 DIAGNOSIS — I10 Essential (primary) hypertension: Secondary | ICD-10-CM | POA: Diagnosis present

## 2022-11-04 DIAGNOSIS — Z95810 Presence of automatic (implantable) cardiac defibrillator: Secondary | ICD-10-CM | POA: Diagnosis not present

## 2022-11-04 DIAGNOSIS — I5043 Acute on chronic combined systolic (congestive) and diastolic (congestive) heart failure: Secondary | ICD-10-CM | POA: Diagnosis present

## 2022-11-04 DIAGNOSIS — Z79899 Other long term (current) drug therapy: Secondary | ICD-10-CM

## 2022-11-04 DIAGNOSIS — Z7901 Long term (current) use of anticoagulants: Secondary | ICD-10-CM

## 2022-11-04 DIAGNOSIS — Z955 Presence of coronary angioplasty implant and graft: Secondary | ICD-10-CM

## 2022-11-04 DIAGNOSIS — I110 Hypertensive heart disease with heart failure: Secondary | ICD-10-CM | POA: Diagnosis present

## 2022-11-04 DIAGNOSIS — J189 Pneumonia, unspecified organism: Secondary | ICD-10-CM | POA: Diagnosis not present

## 2022-11-04 DIAGNOSIS — R0789 Other chest pain: Secondary | ICD-10-CM | POA: Insufficient documentation

## 2022-11-04 DIAGNOSIS — I480 Paroxysmal atrial fibrillation: Secondary | ICD-10-CM | POA: Diagnosis present

## 2022-11-04 DIAGNOSIS — Z91148 Patient's other noncompliance with medication regimen for other reason: Secondary | ICD-10-CM

## 2022-11-04 DIAGNOSIS — R7989 Other specified abnormal findings of blood chemistry: Secondary | ICD-10-CM

## 2022-11-04 DIAGNOSIS — F17210 Nicotine dependence, cigarettes, uncomplicated: Secondary | ICD-10-CM | POA: Diagnosis present

## 2022-11-04 DIAGNOSIS — I959 Hypotension, unspecified: Secondary | ICD-10-CM | POA: Diagnosis present

## 2022-11-04 DIAGNOSIS — R1011 Right upper quadrant pain: Secondary | ICD-10-CM | POA: Diagnosis present

## 2022-11-04 HISTORY — DX: Chronic obstructive pulmonary disease, unspecified: J44.9

## 2022-11-04 HISTORY — DX: Presence of automatic (implantable) cardiac defibrillator: Z95.810

## 2022-11-04 LAB — COMPREHENSIVE METABOLIC PANEL
ALT: 13 U/L (ref 0–44)
AST: 17 U/L (ref 15–41)
Albumin: 4.1 g/dL (ref 3.5–5.0)
Alkaline Phosphatase: 65 U/L (ref 38–126)
Anion gap: 8 (ref 5–15)
BUN: 53 mg/dL — ABNORMAL HIGH (ref 8–23)
CO2: 24 mmol/L (ref 22–32)
Calcium: 9.1 mg/dL (ref 8.9–10.3)
Chloride: 102 mmol/L (ref 98–111)
Creatinine, Ser: 2.86 mg/dL — ABNORMAL HIGH (ref 0.61–1.24)
GFR, Estimated: 23 mL/min — ABNORMAL LOW (ref >60)
Glucose, Bld: 129 mg/dL — ABNORMAL HIGH (ref 70–99)
Potassium: 4.7 mmol/L (ref 3.5–5.1)
Sodium: 134 mmol/L — ABNORMAL LOW (ref 135–145)
Total Bilirubin: 2.2 mg/dL — ABNORMAL HIGH (ref 0.3–1.2)
Total Protein: 8 g/dL (ref 6.5–8.1)

## 2022-11-04 LAB — CBC
HCT: 48.1 % (ref 39.0–52.0)
Hemoglobin: 15.3 g/dL (ref 13.0–17.0)
MCH: 27.7 pg (ref 26.0–34.0)
MCHC: 31.8 g/dL (ref 30.0–36.0)
MCV: 87.1 fL (ref 80.0–100.0)
Platelets: 303 10*3/uL (ref 150–400)
RBC: 5.52 MIL/uL (ref 4.22–5.81)
RDW: 14.3 % (ref 11.5–15.5)
WBC: 16 10*3/uL — ABNORMAL HIGH (ref 4.0–10.5)
nRBC: 0 % (ref 0.0–0.2)

## 2022-11-04 LAB — URINALYSIS, ROUTINE W REFLEX MICROSCOPIC
Bilirubin Urine: NEGATIVE
Glucose, UA: NEGATIVE mg/dL
Hgb urine dipstick: NEGATIVE
Ketones, ur: NEGATIVE mg/dL
Leukocytes,Ua: NEGATIVE
Nitrite: NEGATIVE
Protein, ur: NEGATIVE mg/dL
Specific Gravity, Urine: 1.006 (ref 1.005–1.030)
pH: 5 (ref 5.0–8.0)

## 2022-11-04 LAB — D-DIMER, QUANTITATIVE: D-Dimer, Quant: 1.05 ug/mL-FEU — ABNORMAL HIGH (ref 0.00–0.50)

## 2022-11-04 LAB — RAPID URINE DRUG SCREEN, HOSP PERFORMED
Amphetamines: NOT DETECTED
Barbiturates: NOT DETECTED
Benzodiazepines: NOT DETECTED
Cocaine: NOT DETECTED
Opiates: NOT DETECTED
Tetrahydrocannabinol: POSITIVE — AB

## 2022-11-04 LAB — LIPASE, BLOOD: Lipase: 31 U/L (ref 11–51)

## 2022-11-04 MED ORDER — ATORVASTATIN CALCIUM 40 MG PO TABS
40.0000 mg | ORAL_TABLET | Freq: Every day | ORAL | Status: DC
  Administered 2022-11-05 – 2022-11-06 (×2): 40 mg via ORAL
  Filled 2022-11-04 (×2): qty 1

## 2022-11-04 MED ORDER — SODIUM CHLORIDE 0.9 % IV SOLN
500.0000 mg | INTRAVENOUS | Status: DC
  Administered 2022-11-05 (×2): 500 mg via INTRAVENOUS
  Filled 2022-11-04 (×2): qty 5

## 2022-11-04 MED ORDER — MIDODRINE HCL 5 MG PO TABS
5.0000 mg | ORAL_TABLET | Freq: Three times a day (TID) | ORAL | Status: DC
  Administered 2022-11-05 – 2022-11-06 (×5): 5 mg via ORAL
  Filled 2022-11-04 (×5): qty 1

## 2022-11-04 MED ORDER — SODIUM CHLORIDE 0.9 % IV BOLUS
1000.0000 mL | Freq: Once | INTRAVENOUS | Status: AC
  Administered 2022-11-04: 1000 mL via INTRAVENOUS

## 2022-11-04 MED ORDER — ACETAMINOPHEN 325 MG PO TABS: 650.0000 mg | ORAL_TABLET | Freq: Four times a day (QID) | ORAL | Status: DC | PRN

## 2022-11-04 MED ORDER — SODIUM CHLORIDE 0.9 % IV SOLN
2.0000 g | INTRAVENOUS | Status: DC
  Administered 2022-11-05 (×2): 2 g via INTRAVENOUS
  Filled 2022-11-04 (×2): qty 20

## 2022-11-04 MED ORDER — ACETAMINOPHEN 650 MG RE SUPP: 650.0000 mg | Freq: Four times a day (QID) | RECTAL | Status: DC | PRN

## 2022-11-04 MED ORDER — FENTANYL CITRATE PF 50 MCG/ML IJ SOSY: 50.0000 ug | PREFILLED_SYRINGE | Freq: Once | INTRAMUSCULAR | Status: DC

## 2022-11-04 MED ORDER — APIXABAN 5 MG PO TABS
5.0000 mg | ORAL_TABLET | Freq: Two times a day (BID) | ORAL | Status: DC
  Administered 2022-11-04 – 2022-11-06 (×4): 5 mg via ORAL
  Filled 2022-11-04 (×4): qty 1

## 2022-11-04 MED ORDER — HEPARIN SODIUM (PORCINE) 5000 UNIT/ML IJ SOLN: 5000.0000 [IU] | Freq: Three times a day (TID) | INTRAMUSCULAR | Status: DC

## 2022-11-04 NOTE — ED Triage Notes (Addendum)
Pt c/o right sided abdominal pain x 3 days. 1 day of vomiting. None currently.

## 2022-11-04 NOTE — ED Provider Notes (Signed)
[facility] EMERGENCY DEPARTMENT Provider Note   CSN: [id number] Arrival date & time: 11/04/22  1142     History  Chief Complaint  Patient presents with   Abdominal Pain    Roger Mooney is a 71 y.o. male with history of diabetes, hypertension, CAD s/p ICD and pacemaker, CHF (EF 20 to 25% as of echo in July 2022), diabetes, hypertension, polysubstance abuse who presents to the emergency department complaining of right upper quadrant abdominal pain for the past 3 days.  Patient denies any aggravating or alleviating factors, but has noticed that his right side is more painful to the touch.  Sometimes it catches his breath because of the pain.  Vomited several times yesterday, no longer nauseous today.  Denies any fevers or chills.  No cough or increased work of breathing. No urinary symptoms, normal bowel movements.  Abdominal Pain Associated symptoms: nausea and vomiting   Associated symptoms: no chest pain, no chills, no constipation, no cough, no diarrhea, no dysuria, no fever, no hematuria and no shortness of breath        Home Medications Prior to Admission medications   Medication Sig Start Date End Date Taking? Authorizing Provider  acetaminophen (TYLENOL) 325 MG tablet Take 2 tablets (650 mg total) by mouth every 6 (six) hours as needed for mild pain (or Fever >/= 101). 05/16/21   Roxan Hockey, MD  albuterol (VENTOLIN HFA) 108 (90 Base) MCG/ACT inhaler Inhale 2 puffs into the lungs every 4 (four) hours as needed for wheezing or shortness of breath. 08/06/22   [provider]  apixaban (ELIQUIS) 5 MG TABS tablet Take 1 tablet (5 mg total) by mouth 2 (two) times daily. 06/20/21   Ghimire, Henreitta Leber, MD  atorvastatin (LIPITOR) 40 MG tablet Take 1 tablet (40 mg total) by mouth daily. 05/16/21   Roxan Hockey, MD  colchicine 0.6 MG tablet Take 1 tablet (0.6 mg total) by mouth 2 (two) times daily. 05/16/21   Roxan Hockey, MD  gabapentin (NEURONTIN) 400 MG capsule Take 400  mg by mouth 3 (three) times daily. 03/19/21   [provider]  isosorbide mononitrate (IMDUR) 30 MG 24 hr tablet Take 0.5 tablets (15 mg total) by mouth daily. 06/23/21 09/11/22  Imogene Burn, PA-C  metFORMIN (GLUCOPHAGE) 500 MG tablet Take 1 tablet (500 mg total) by mouth 2 (two) times daily with a meal. 05/16/21   Emokpae, Courage, MD  midodrine (PROAMATINE) 5 MG tablet Take 1 tablet (5 mg total) by mouth 3 (three) times daily with meals. Patient not taking: Reported on 09/11/2022 06/20/21   Jonetta Osgood, MD  pantoprazole (PROTONIX) 40 MG tablet Take 1 tablet (40 mg total) by mouth daily. Patient not taking: Reported on 09/11/2022 05/16/21   Roxan Hockey, MD  PARoxetine (PAXIL) 20 MG tablet Take 1 tablet (20 mg total) by mouth daily. 05/16/21   Roxan Hockey, MD  QUEtiapine (SEROQUEL) 25 MG tablet Take 25 mg by mouth at bedtime. 03/19/21   [provider]  TRELEGY ELLIPTA 100-62.5-25 MCG/ACT AEPB Take 1 puff by mouth daily. 08/06/22   [provider]      Allergies    Patient has no known allergies.    Review of Systems   Review of Systems  Constitutional:  Negative for chills and fever.  Respiratory:  Negative for cough and shortness of breath.   Cardiovascular:  Negative for chest pain and leg swelling.  Gastrointestinal:  Positive for abdominal pain, nausea and vomiting. Negative for blood in  stool, constipation and diarrhea.  Genitourinary:  Negative for dysuria, flank pain and hematuria.  Musculoskeletal:  Negative for back pain.  All other systems reviewed and are negative.   Physical Exam Updated Vital Signs BP 95/73 (BP Location: Left Arm)   Pulse (!) 57   Temp 97.8 F (36.6 C) (Oral)   Resp 16   Ht 5\' 11"  (1.803 m)   Wt 77.1 kg   SpO2 92%   BMI 23.71 kg/m  Physical Exam Vitals and nursing note reviewed.  Constitutional:      Appearance: Normal appearance.  HENT:     Head: Normocephalic and atraumatic.  Eyes:     Conjunctiva/sclera:  Conjunctivae normal.  Cardiovascular:     Rate and Rhythm: Normal rate and regular rhythm.  Pulmonary:     Effort: Pulmonary effort is normal. No respiratory distress.     Breath sounds: Normal breath sounds.  Abdominal:     General: There is no distension.     Palpations: Abdomen is soft.     Tenderness: There is abdominal tenderness in the right upper quadrant. There is guarding.     Comments: Halted inspiration with RUQ palpation  Skin:    General: Skin is warm and dry.  Neurological:     General: No focal deficit present.     Mental Status: He is alert.     ED Results / Procedures / Treatments   Labs (all labs ordered are listed, but only abnormal results are displayed) Labs Reviewed  COMPREHENSIVE METABOLIC PANEL - Abnormal; Notable for the following components:      Result Value   Sodium 134 (*)    Glucose, Bld 129 (*)    BUN 53 (*)    Creatinine, Ser 2.86 (*)    Total Bilirubin 2.2 (*)    GFR, Estimated 23 (*)    All other components within normal limits  CBC - Abnormal; Notable for the following components:   WBC 16.0 (*)    All other components within normal limits  URINALYSIS, ROUTINE W REFLEX MICROSCOPIC - Abnormal; Notable for the following components:   Color, Urine STRAW (*)    All other components within normal limits  D-DIMER, QUANTITATIVE - Abnormal; Notable for the following components:   D-Dimer, Quant 1.05 (*)    All other components within normal limits  RAPID URINE DRUG SCREEN, HOSP PERFORMED - Abnormal; Notable for the following components:   Tetrahydrocannabinol POSITIVE (*)    All other components within normal limits  LIPASE, BLOOD    EKG None  Radiology DG Chest 2 View  Result Date: 11/04/2022 CLINICAL DATA:  Right-sided abdominal pain and decreased O2 sats. EXAM: CHEST - 2 VIEW COMPARISON:  09/11/2022 FINDINGS: Right lung is clear. Patchy airspace disease noted at the left base. Interstitial markings are diffusely coarsened with  chronic features. The cardiopericardial silhouette is within normal limits for size. Left-sided pacer/AICD again noted. The visualized bony structures of the thorax are unremarkable. IMPRESSION: Patchy airspace disease at the left base compatible with atelectasis or pneumonia. This is new in the interval since 09/11/2022. Electronically Signed   By: 09/13/2022 M.D.   On: 11/04/2022 17:16   CT ABDOMEN PELVIS WO CONTRAST  Result Date: 11/04/2022 CLINICAL DATA:  RIGHT upper quadrant and RIGHT-side abdominal pain for 3 days, vomiting for 1 day, negative ultrasound EXAM: CT ABDOMEN AND PELVIS WITHOUT CONTRAST TECHNIQUE: Multidetector CT imaging of the abdomen and pelvis was performed following the standard protocol without IV contrast. RADIATION DOSE  REDUCTION: This exam was performed according to the departmental dose-optimization program which includes automated exposure control, adjustment of the mA and/or kV according to patient size and/or use of iterative reconstruction technique. COMPARISON:  None Available. FINDINGS: Lower chest: Minimal interstitial and interseptal prominence at lung bases. No infiltrate or effusion. Pacemaker leads RIGHT atrium and RIGHT ventricle. Calcification at cardiac apex question prior MI. Hepatobiliary: Gallbladder and liver normal appearance Pancreas: Atrophic pancreas without mass Spleen: Normal appearance Adrenals/Urinary Tract: Adrenal glands normal appearance. RIGHT kidney normal appearance without mass or hydronephrosis. Calculi in LEFT kidney measuring 7 mm and 5 mm in diameter without renal mass or hydronephrosis. Ureters and bladder unremarkable. Stomach/Bowel: Normal appendix arising from cecum at midline of pelvis. Distal colonic diverticulosis without evidence of diverticulitis. Stomach and remaining bowel loops normal appearance Vascular/Lymphatic: Atherosclerotic calcifications aorta and iliac arteries without aneurysm. No adenopathy. Reproductive: Unremarkable  prostate gland and seminal vesicles Other: LEFT inguinal hernia containing fat. No free air or free fluid. No inflammatory process. Musculoskeletal: Osseous demineralization. IMPRESSION: Nonobstructing LEFT renal calculi. Distal colonic diverticulosis without evidence of diverticulitis. LEFT inguinal hernia containing fat. No acute intra-abdominal or intrapelvic abnormalities. Aortic Atherosclerosis (ICD10-I70.0). Electronically Signed   By: Lavonia Dana M.D.   On: 11/04/2022 15:04   US Abdomen Limited RUQ (LIVER/GB)  Result Date: 11/04/2022 CLINICAL DATA:  Acute right upper quadrant abdominal pain. EXAM: ULTRASOUND ABDOMEN LIMITED RIGHT UPPER QUADRANT COMPARISON:  None Available. FINDINGS: Gallbladder: No gallstones or wall thickening visualized. No sonographic Murphy sign noted by sonographer. Common bile duct: Diameter: 5 mm which is within normal limits. Liver: No focal lesion identified. Within normal limits in parenchymal echogenicity. Portal vein is patent on color Doppler imaging with normal direction of blood flow towards the liver. Other: None. IMPRESSION: No definite abnormality seen in the right upper quadrant of the abdomen. Electronically Signed   By: Marijo Conception M.D.   On: 11/04/2022 14:27    Procedures Procedures    Medications Ordered in ED Medications  sodium chloride 0.9 % bolus 1,000 mL (0 mLs Intravenous Stopped 11/04/22 1630)    ED Course/ Medical Decision Making/ A&P                           Medical Decision Making Amount and/or Complexity of Data Reviewed Labs: ordered. Radiology: ordered.  This patient is a 72 y.o. male  who presents to the ED for concern of RUQ pain x 3 days.   Differential diagnoses prior to evaluation: The emergent differential diagnosis includes, but is not limited to,  Biliary colic, cholecystitis, hepatitis (viral, alcoholic, toxic), appendicitis, GERD/PUD, pancreatitis, malignancy, hepatic ischemia, right lower lobe pneumonia, PE,  pyelonephritis, urinary calculi, UTI, herpes zoster, musculoskeletal pain, herniated disk, intestinal ischemia, IBD. This is not an exhaustive differential.   Past Medical History / Co-morbidities: diabetes, hypertension, CAD s/p ICD and pacemaker, CHF (EF 20 to 25% as of echo in July 2022), diabetes, hypertension, polysubstance abuse  Additional history: Chart reviewed. Pertinent results include: Patient was hospitalized in September of this year for shock due to pneumonia with acute heart and renal failure.  Patient left against medical advice at that time.   Physical Exam: Physical exam performed. The pertinent findings include: Persistently soft blood pressures, consistent with patient's baseline.  Asymptomatic with blood pressure changes.  Normal oxygen saturation on room air, no increased work of breathing.  Right upper quadrant tenderness to palpation, with halted inspiration.  Lab Tests/Imaging studies: I  personally interpreted labs/imaging and the pertinent results include: Leukocytosis of 16, normal hemoglobin.  CMP with baseline kidney function, normal LFTs.  Urinalysis normal.  Normal lipase.  D-dimer 1.05.  Right upper quadrant ultrasound without acute abnormalities, CT abdomen pelvis with nonobstructing left renal calculi, diverticulosis, left inguinal hernia.  Chest x-ray with patchy airspace disease at the left base. I agree with the radiologist interpretation.   Medications: I ordered medication including fentanyl.  I have reviewed the patients home medicines and have made adjustments as needed.  Consultations obtained: I consulted with hospitalist Dr Waldron Labs who will admit the patient.    Disposition: After consideration of the diagnostic results and the patients response to treatment, I feel that patient would benefit from medical admission for ongoing RUQ pain with suspicion for pulmonary embolism in the setting of positive d-dimer. Patient has hx of renal disease and I  feel he requires V/Q scanning to rule out PE.   I discussed this case with my attending physician Dr. Sabra Heck who cosigned this note including patient's presenting symptoms, physical exam, and planned diagnostics and interventions. Attending physician stated agreement with plan or made changes to plan which were implemented.   Final Clinical Impression(s) / ED Diagnoses Final diagnoses:  RUQ pain  Positive D dimer    Rx / DC Orders ED Discharge Orders     None      Portions of this report may have been transcribed using voice recognition software. Every effort was made to ensure accuracy; however, inadvertent computerized transcription errors may be present.    Estill Cotta 11/04/22 Arvin Collard, MD 11/07/22 0900

## 2022-11-04 NOTE — H&P (Signed)
TRH H&P   Patient Demographics:    Roger Mooney, is a 71 y.o. male  MRN: [id number]   DOB - Jan 11, 1951  Admit Date - 11/04/2022  Outpatient Primary MD for the patient is Pcp, No  Referring MD/NP/PA: PA Roemhildt  Patient coming from: home  Chief Complaint  Patient presents with   Abdominal Pain      HPI:    Roger Mooney  is a 71 y.o. male, with history of HFrEF (EF 25%) LV thrombus, HTN, DM, polysubstance abuse , patient presents to ED secondary to complaints of abdominal pain, patient has been complaining for right upper quadrant abdominal pain, for past 3 days, as well he does report cough, productive phlegm, reports worsening due to cough and palpation, he denies fever, chills, shortness of breath or chest pain . -In ED patient has no hypoxia, chest x-ray significant for left opacity infiltrate, D-dimers came back reassuring at 1.05, CT abdomen pelvis significant for nonobstructing left renal calculi, diverticulosis, left inguinal hernia, otherwise no acute intra-abdominal or intrapelvic abnormalities, [facility] hospitalist consulted to admit    Review of systems:     A full 10 point Review of Systems was done, except as stated above, all other Review of Systems were negative.   With Past History of the following :    Past Medical History:  Diagnosis Date   CHF (congestive heart failure) (HCC)    COPD (chronic obstructive pulmonary disease) ([place])    Coronary artery disease    Diabetes mellitus without complication ([place])    Hypertension    Presence of combination internal cardiac defibrillator (ICD) and pacemaker       Past Surgical History:  Procedure Laterality Date   CARDIAC DEFIBRILLATOR PLACEMENT     PACEMAKER IMPLANT        Social History:     Social History   Tobacco Use   Smoking status: Every Day    Packs/day: 0.25    Types: Cigarettes    Smokeless tobacco: Never  Substance Use Topics   Alcohol use: Not Currently       Family History :    History reviewed. No pertinent family history.   Home Medications:   Prior to Admission medications   Medication Sig Start Date End Date Taking? Authorizing Provider  albuterol (VENTOLIN HFA) 108 (90 Base) MCG/ACT inhaler Inhale 2 puffs into the lungs every 4 (four) hours as needed for wheezing or shortness of breath. 08/06/22  Yes [provider]  atorvastatin (LIPITOR) 40 MG tablet Take 1 tablet (40 mg total) by mouth daily. 05/16/21  Yes Emokpae, Courage, MD  gabapentin (NEURONTIN) 300 MG capsule Take 300 mg by mouth 2 (two) times daily. 10/16/22  Yes [provider]  isosorbide mononitrate (IMDUR) 30 MG 24 hr tablet Take 0.5 tablets (15 mg total) by mouth daily. 06/23/21 11/04/22 Yes Imogene Burn, PA-C  lisinopril (ZESTRIL) 5  MG tablet Take 5 mg by mouth daily. 10/16/22  Yes [provider]  metFORMIN (GLUCOPHAGE) 500 MG tablet Take 1 tablet (500 mg total) by mouth 2 (two) times daily with a meal. 05/16/21  Yes Emokpae, Courage, MD  PARoxetine (PAXIL) 20 MG tablet Take 1 tablet (20 mg total) by mouth daily. 05/16/21  Yes Emokpae, Courage, MD  TRELEGY ELLIPTA 100-62.5-25 MCG/ACT AEPB Take 1 puff by mouth daily. 08/06/22  Yes [provider]  acetaminophen (TYLENOL) 325 MG tablet Take 2 tablets (650 mg total) by mouth every 6 (six) hours as needed for mild pain (or Fever >/= 101). 05/16/21   Shon Hale, MD  apixaban (ELIQUIS) 5 MG TABS tablet Take 1 tablet (5 mg total) by mouth 2 (two) times daily. Patient not taking: Reported on 11/04/2022 06/20/21   Maretta Bees, MD  colchicine 0.6 MG tablet Take 1 tablet (0.6 mg total) by mouth 2 (two) times daily. Patient not taking: Reported on 11/04/2022 05/16/21   Shon Hale, MD  gabapentin (NEURONTIN) 400 MG capsule Take 400 mg by mouth 3 (three) times daily. Patient not taking: Reported on 11/04/2022  03/19/21   [provider]  midodrine (PROAMATINE) 5 MG tablet Take 1 tablet (5 mg total) by mouth 3 (three) times daily with meals. Patient not taking: Reported on 09/11/2022 06/20/21   Maretta Bees, MD  pantoprazole (PROTONIX) 40 MG tablet Take 1 tablet (40 mg total) by mouth daily. Patient not taking: Reported on 09/11/2022 05/16/21   Shon Hale, MD  QUEtiapine (SEROQUEL) 25 MG tablet Take 25 mg by mouth at bedtime. Patient not taking: Reported on 11/04/2022 03/19/21   [provider]     Allergies:    No Known Allergies   Physical Exam:   Vitals  Blood pressure 95/73, pulse (!) 57, temperature 97.8 F (36.6 C), temperature source Oral, resp. rate 16, height 5\' 11"  (1.803 m), weight 77.1 kg, SpO2 92 %.   1. General lying in bed in NAD  2. Normal affect and insight, Not Suicidal or Homicidal, Awake Alert, Oriented X 3.  3. No F.N deficits, ALL C.Nerves Intact, Strength 5/5 all 4 extremities, Sensation intact all 4 extremities, Plantars down going.  4. Ears and Eyes appear Normal, Conjunctivae clear, PERRLA. Moist Oral Mucosa.  5. Supple Neck, No JVD, No cervical lymphadenopathy appriciated, No Carotid Bruits.  6. Symmetrical Chest wall movement, Good air movement bilaterally, CTAB.  7. RRR, No Gallops, Rubs or Murmurs, No Parasternal Heave.  Had reproducible palpation in lower right rib cage area  8. Positive Bowel Sounds, Abdomen Soft, No tenderness, No organomegaly appriciated,No rebound -guarding or rigidity.  9.  No Cyanosis, Normal Skin Turgor, No Skin Rash or Bruise.  10. Good muscle tone,  joints appear normal , no effusions, Normal ROM.  11. No Palpable Lymph Nodes in Neck or Axillae     Data Review:    CBC Recent Labs  Lab 11/04/22 1325  WBC 16.0*  HGB 15.3  HCT 48.1  PLT 303  MCV 87.1  MCH 27.7  MCHC 31.8  RDW 14.3    ------------------------------------------------------------------------------------------------------------------  Chemistries  Recent Labs  Lab 11/04/22 1325  NA 134*  K 4.7  CL 102  CO2 24  GLUCOSE 129*  BUN 53*  CREATININE 2.86*  CALCIUM 9.1  AST 17  ALT 13  ALKPHOS 65  BILITOT 2.2*   ------------------------------------------------------------------------------------------------------------------ estimated creatinine clearance is 25.6 mL/min (A) (by C-G formula based on SCr of 2.86 mg/dL (H)). ------------------------------------------------------------------------------------------------------------------  No results for input(s): "TSH", "T4TOTAL", "T3FREE", "THYROIDAB" in the last 72 hours.  Invalid input(s): "FREET3"  Coagulation profile No results for input(s): "INR", "PROTIME" in the last 168 hours. ------------------------------------------------------------------------------------------------------------------- Recent Labs    11/04/22 1325  DDIMER 1.05*   -------------------------------------------------------------------------------------------------------------------  Cardiac Enzymes No results for input(s): "CKMB", "TROPONINI", "MYOGLOBIN" in the last 168 hours.  Invalid input(s): "CK" ------------------------------------------------------------------------------------------------------------------    Component Value Date/Time   BNP 57.0 09/11/2022 1735     ---------------------------------------------------------------------------------------------------------------  Urinalysis    Component Value Date/Time   COLORURINE STRAW (A) 11/04/2022 1620   APPEARANCEUR CLEAR 11/04/2022 1620   LABSPEC 1.006 11/04/2022 1620   PHURINE 5.0 11/04/2022 1620   GLUCOSEU NEGATIVE 11/04/2022 1620   HGBUR NEGATIVE 11/04/2022 1620   BILIRUBINUR NEGATIVE 11/04/2022 1620   KETONESUR NEGATIVE 11/04/2022 1620   PROTEINUR NEGATIVE 11/04/2022 1620   NITRITE  NEGATIVE 11/04/2022 1620   LEUKOCYTESUR NEGATIVE 11/04/2022 1620    ----------------------------------------------------------------------------------------------------------------   Imaging Results:    DG Chest 2 View  Result Date: 11/04/2022 CLINICAL DATA:  Right-sided abdominal pain and decreased O2 sats. EXAM: CHEST - 2 VIEW COMPARISON:  09/11/2022 FINDINGS: Right lung is clear. Patchy airspace disease noted at the left base. Interstitial markings are diffusely coarsened with chronic features. The cardiopericardial silhouette is within normal limits for size. Left-sided pacer/AICD again noted. The visualized bony structures of the thorax are unremarkable. IMPRESSION: Patchy airspace disease at the left base compatible with atelectasis or pneumonia. This is new in the interval since 09/11/2022. Electronically Signed   By: Misty Stanley M.D.   On: 11/04/2022 17:16   CT ABDOMEN PELVIS WO CONTRAST  Result Date: 11/04/2022 CLINICAL DATA:  RIGHT upper quadrant and RIGHT-side abdominal pain for 3 days, vomiting for 1 day, negative ultrasound EXAM: CT ABDOMEN AND PELVIS WITHOUT CONTRAST TECHNIQUE: Multidetector CT imaging of the abdomen and pelvis was performed following the standard protocol without IV contrast. RADIATION DOSE REDUCTION: This exam was performed according to the departmental dose-optimization program which includes automated exposure control, adjustment of the mA and/or kV according to patient size and/or use of iterative reconstruction technique. COMPARISON:  None Available. FINDINGS: Lower chest: Minimal interstitial and interseptal prominence at lung bases. No infiltrate or effusion. Pacemaker leads RIGHT atrium and RIGHT ventricle. Calcification at cardiac apex question prior MI. Hepatobiliary: Gallbladder and liver normal appearance Pancreas: Atrophic pancreas without mass Spleen: Normal appearance Adrenals/Urinary Tract: Adrenal glands normal appearance. RIGHT kidney normal  appearance without mass or hydronephrosis. Calculi in LEFT kidney measuring 7 mm and 5 mm in diameter without renal mass or hydronephrosis. Ureters and bladder unremarkable. Stomach/Bowel: Normal appendix arising from cecum at midline of pelvis. Distal colonic diverticulosis without evidence of diverticulitis. Stomach and remaining bowel loops normal appearance Vascular/Lymphatic: Atherosclerotic calcifications aorta and iliac arteries without aneurysm. No adenopathy. Reproductive: Unremarkable prostate gland and seminal vesicles Other: LEFT inguinal hernia containing fat. No free air or free fluid. No inflammatory process. Musculoskeletal: Osseous demineralization. IMPRESSION: Nonobstructing LEFT renal calculi. Distal colonic diverticulosis without evidence of diverticulitis. LEFT inguinal hernia containing fat. No acute intra-abdominal or intrapelvic abnormalities. Aortic Atherosclerosis (ICD10-I70.0). Electronically Signed   By: Lavonia Dana M.D.   On: 11/04/2022 15:04   US Abdomen Limited RUQ (LIVER/GB)  Result Date: 11/04/2022 CLINICAL DATA:  Acute right upper quadrant abdominal pain. EXAM: ULTRASOUND ABDOMEN LIMITED RIGHT UPPER QUADRANT COMPARISON:  None Available. FINDINGS: Gallbladder: No gallstones or wall thickening visualized. No sonographic Murphy sign noted by sonographer. Common bile duct: Diameter: 5 mm which is within  normal limits. Liver: No focal lesion identified. Within normal limits in parenchymal echogenicity. Portal vein is patent on color Doppler imaging with normal direction of blood flow towards the liver. Other: None. IMPRESSION: No definite abnormality seen in the right upper quadrant of the abdomen. Electronically Signed   By: Marijo Conception M.D.   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    CAP -Patient presents with cough, musculoskeletal chest pain related to cough, had leukocytosis of 16 K, and left lung opacity -He is started on IV Rocephin and azithromycin. -Check sputum culture -He was encouraged to use incentive spirometry and flutter valve     HFrEF-s/p ICD: -Appears to be euvolemic -His blood pressure is soft, appears to be this is a chronic issue, used to be on midodrine in the past, I will resume. -Resume Imdur, lisinopril, when blood pressure improves-    History of LV thrombus Paroxysmal A-fib -Noncompliance with warfarin, now on Eliquis -He does not appear to be taking Eliquis at this point, likely due to noncompliance, will start him on Eliquis, likely will need new prescription on discharge    CAD-history of prior PCI:  -Presents with musculoskeletal chest pain, reproducible by palpation . -On antiplatelet as he is on Eliquis   DM-2  -hold Metformin and keep on insulin sliding scale during hospital stay     DVT Prophylaxis Heparin -   AM Labs Ordered, also please review Full Orders  Family Communication: Admission, patients condition and plan of care including tests being ordered have been discussed with the patient  who indicate understanding and agree with the plan and Code Status.  Code Status  Full  Likely DC to  home  Condition GUARDED    Consults called: none    Admission status: Observation    Time spent in minutes : 65  minutes   Phillips Climes M.D on 11/04/2022 at 9:48 PM   [facility] - Office  [phone]

## 2022-11-05 ENCOUNTER — Encounter (HOSPITAL_COMMUNITY): Payer: Self-pay | Admitting: Internal Medicine

## 2022-11-05 ENCOUNTER — Observation Stay (HOSPITAL_COMMUNITY): Payer: Medicare Other

## 2022-11-05 DIAGNOSIS — R17 Unspecified jaundice: Secondary | ICD-10-CM | POA: Diagnosis present

## 2022-11-05 DIAGNOSIS — I5022 Chronic systolic (congestive) heart failure: Secondary | ICD-10-CM | POA: Diagnosis present

## 2022-11-05 DIAGNOSIS — Z7984 Long term (current) use of oral hypoglycemic drugs: Secondary | ICD-10-CM | POA: Diagnosis not present

## 2022-11-05 DIAGNOSIS — I5043 Acute on chronic combined systolic (congestive) and diastolic (congestive) heart failure: Secondary | ICD-10-CM | POA: Diagnosis not present

## 2022-11-05 DIAGNOSIS — I110 Hypertensive heart disease with heart failure: Secondary | ICD-10-CM | POA: Diagnosis present

## 2022-11-05 DIAGNOSIS — F17210 Nicotine dependence, cigarettes, uncomplicated: Secondary | ICD-10-CM | POA: Diagnosis present

## 2022-11-05 DIAGNOSIS — Z95810 Presence of automatic (implantable) cardiac defibrillator: Secondary | ICD-10-CM | POA: Diagnosis not present

## 2022-11-05 DIAGNOSIS — Z91148 Patient's other noncompliance with medication regimen for other reason: Secondary | ICD-10-CM | POA: Diagnosis not present

## 2022-11-05 DIAGNOSIS — J189 Pneumonia, unspecified organism: Secondary | ICD-10-CM | POA: Diagnosis present

## 2022-11-05 DIAGNOSIS — R1011 Right upper quadrant pain: Secondary | ICD-10-CM | POA: Diagnosis present

## 2022-11-05 DIAGNOSIS — I480 Paroxysmal atrial fibrillation: Secondary | ICD-10-CM | POA: Diagnosis present

## 2022-11-05 DIAGNOSIS — J440 Chronic obstructive pulmonary disease with acute lower respiratory infection: Secondary | ICD-10-CM | POA: Diagnosis present

## 2022-11-05 DIAGNOSIS — E119 Type 2 diabetes mellitus without complications: Secondary | ICD-10-CM | POA: Diagnosis present

## 2022-11-05 DIAGNOSIS — I2510 Atherosclerotic heart disease of native coronary artery without angina pectoris: Secondary | ICD-10-CM | POA: Diagnosis present

## 2022-11-05 DIAGNOSIS — Z79899 Other long term (current) drug therapy: Secondary | ICD-10-CM | POA: Diagnosis not present

## 2022-11-05 DIAGNOSIS — Z7901 Long term (current) use of anticoagulants: Secondary | ICD-10-CM | POA: Diagnosis not present

## 2022-11-05 DIAGNOSIS — Z955 Presence of coronary angioplasty implant and graft: Secondary | ICD-10-CM | POA: Diagnosis not present

## 2022-11-05 DIAGNOSIS — N179 Acute kidney failure, unspecified: Secondary | ICD-10-CM | POA: Diagnosis present

## 2022-11-05 LAB — BASIC METABOLIC PANEL
Anion gap: 9 (ref 5–15)
BUN: 53 mg/dL — ABNORMAL HIGH (ref 8–23)
CO2: 23 mmol/L (ref 22–32)
Calcium: 8.7 mg/dL — ABNORMAL LOW (ref 8.9–10.3)
Chloride: 105 mmol/L (ref 98–111)
Creatinine, Ser: 2.56 mg/dL — ABNORMAL HIGH (ref 0.61–1.24)
GFR, Estimated: 26 mL/min — ABNORMAL LOW (ref >60)
Glucose, Bld: 102 mg/dL — ABNORMAL HIGH (ref 70–99)
Potassium: 4.5 mmol/L (ref 3.5–5.1)
Sodium: 137 mmol/L (ref 135–145)

## 2022-11-05 LAB — CBC
HCT: 47.3 % (ref 39.0–52.0)
Hemoglobin: 14.9 g/dL (ref 13.0–17.0)
MCH: 27.4 pg (ref 26.0–34.0)
MCHC: 31.5 g/dL (ref 30.0–36.0)
MCV: 87.1 fL (ref 80.0–100.0)
Platelets: 253 10*3/uL (ref 150–400)
RBC: 5.43 MIL/uL (ref 4.22–5.81)
RDW: 14.6 % (ref 11.5–15.5)
WBC: 12.1 10*3/uL — ABNORMAL HIGH (ref 4.0–10.5)
nRBC: 0 % (ref 0.0–0.2)

## 2022-11-05 LAB — HIV ANTIBODY (ROUTINE TESTING W REFLEX): HIV Screen 4th Generation wRfx: NONREACTIVE

## 2022-11-05 MED ORDER — FLUTICASONE FUROATE-VILANTEROL 100-25 MCG/ACT IN AEPB
1.0000 | INHALATION_SPRAY | Freq: Every day | RESPIRATORY_TRACT | Status: DC
  Administered 2022-11-06: 1 via RESPIRATORY_TRACT
  Filled 2022-11-05: qty 28

## 2022-11-05 MED ORDER — UMECLIDINIUM BROMIDE 62.5 MCG/ACT IN AEPB
1.0000 | INHALATION_SPRAY | Freq: Every day | RESPIRATORY_TRACT | Status: DC
  Administered 2022-11-06: 1 via RESPIRATORY_TRACT
  Filled 2022-11-05: qty 7

## 2022-11-05 MED ORDER — GUAIFENESIN ER 600 MG PO TB12
600.0000 mg | ORAL_TABLET | Freq: Two times a day (BID) | ORAL | Status: DC
  Administered 2022-11-05 – 2022-11-06 (×3): 600 mg via ORAL
  Filled 2022-11-05 (×3): qty 1

## 2022-11-05 MED ORDER — SODIUM CHLORIDE 0.9 % IV SOLN: INTRAVENOUS | Status: DC

## 2022-11-05 MED ORDER — TECHNETIUM TO 99M ALBUMIN AGGREGATED
4.0000 | Freq: Once | INTRAVENOUS | Status: AC | PRN
  Administered 2022-11-05: 4.4 via INTRAVENOUS

## 2022-11-05 MED ORDER — PAROXETINE HCL 20 MG PO TABS
20.0000 mg | ORAL_TABLET | Freq: Every day | ORAL | Status: DC
  Administered 2022-11-05 – 2022-11-06 (×2): 20 mg via ORAL
  Filled 2022-11-05 (×2): qty 1

## 2022-11-05 MED ORDER — HYDROCODONE-ACETAMINOPHEN 5-325 MG PO TABS
1.0000 | ORAL_TABLET | Freq: Four times a day (QID) | ORAL | Status: DC | PRN
  Administered 2022-11-05 – 2022-11-06 (×3): 1 via ORAL
  Filled 2022-11-05 (×3): qty 1

## 2022-11-05 MED ORDER — GABAPENTIN 300 MG PO CAPS
300.0000 mg | ORAL_CAPSULE | Freq: Two times a day (BID) | ORAL | Status: DC
  Administered 2022-11-05 – 2022-11-06 (×2): 300 mg via ORAL
  Filled 2022-11-05 (×2): qty 1

## 2022-11-05 MED ORDER — SODIUM CHLORIDE 0.9 % IV BOLUS
1000.0000 mL | Freq: Once | INTRAVENOUS | Status: AC
  Administered 2022-11-05: 1000 mL via INTRAVENOUS

## 2022-11-05 NOTE — Progress Notes (Signed)
Advised pt of 2 person skin assessment that needs completed on admission.  Started to assess pt back, pt then turned around to this nurse and Selena Batten, RN, pulled down his jeans and boxers, lifted up his shirt and said "There, you see now?  I ought to know what I have and don't have.  I don't have anything."  Pt then asked this nurse, "Are you done with all your questions?"  Oriented pt to surroundings, gave fluids and snack, advised pt to call with any questions/concerns.  Will continue to monitor.

## 2022-11-05 NOTE — Care Management Obs Status (Signed)
MEDICARE OBSERVATION STATUS NOTIFICATION   Patient Details  Name: Roger Mooney MRN: [id number] Date of Birth: [birth year]   Medicare Observation Status Notification Given:  Yes    Villa Herb, LCSWA 11/05/2022, 10:14 AM

## 2022-11-05 NOTE — Progress Notes (Signed)
Patients blood pressure 82/59, pulse 60, patient alert and verbal. MD Memon made aware. New orders placed give 0.9% Sodium Chloride Bolus, patient given bolus at 1027, rechecked blood pressure was 98/64. MD aware.

## 2022-11-05 NOTE — Progress Notes (Signed)
  Transition of Care [facility]) Screening Note   Patient Details  Name: Roger Mooney Date of Birth: 1950-12-16   Transition of Care [facility]) CM/SW Contact:    Villa Herb, LCSWA Phone Number: 11/05/2022, 10:14 AM  TOC consulted for PCP needs. CSW spoke with pt who states that he does not have a PCP in the area. CSW to provide pt with PCP list to room. CSW explained that pt can reach out to PCP offices from list to see who is able to accept him as a pt. Pt understanding.   Transition of Care Department [facility]) has reviewed patient and no TOC needs have been identified at this time. We will continue to monitor patient advancement through interdisciplinary progression rounds. If new patient transition needs arise, please place a TOC consult.

## 2022-11-05 NOTE — Progress Notes (Signed)
PROGRESS NOTE    Roger Mooney  TKZ:[id number] DOB: March 04, 1951 DOA: 11/04/2022 PCP: Pcp, No    Brief Narrative:  71 year old male with a history of HFrEF, LV thrombus, diabetes, presents to the hospital with right upper quadrant abdominal pain.  He did also report some cough.  Reports that he is chronically short of breath.  CT abdomen pelvis as well as abdominal ultrasound were unrevealing.  Noted to have mildly elevated bilirubin.  Creatinine also noted to be elevated which is now a new finding.  Blood pressure noted to be low.  He was admitted for further evaluation.   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 - -EF 20 to 25%-with global hypokinesis   AICD (automatic cardioverter/defibrillator) present   CAD-status post prior LAD stent--- last LHC 12/02/2020, with patent vessels apparently   Hypotension   HTN (hypertension)   DM (diabetes mellitus) (HCC)   Chronic systolic CHF (congestive heart failure) (HCC)   Atypical chest pain   Abdominal pain, right upper quadrant -Etiology is unclear -He did have a CT abdomen as well as abdominal ultrasound that were relatively unrevealing -Transaminases were noted to be normal, although he was noted to have an elevated bilirubin -He is somewhat tender on palpation -Repeat LFTs in a.m., if continues to trend up, could consider HIDA scan -He also does describe some shortness of breath.  He is supposed to be taking Eliquis at home and there is question if he has been compliant with this.  D-dimer mildly elevated.  Will check VQ scan to rule out underlying PE  Community-acquired pneumonia -Noted to have leukocytosis on admission with possible left lung opacity -Started on IV antibiotics -Pulmonary hygiene  HFrEF status post ICD -Overall appears to be euvolemic -Chronically on low-dose lisinopril as well as Imdur which will be held for  low blood pressures -Interestingly, he also has midodrine on his home list which is being continued -Echocardiogram from 06/2021 shows EF of 20 to 25% and grade 1 diastolic dysfunction -Does not appear to be on any chronic diuretics -Monitor volume status closely  Paroxysmal atrial fib History of LV thrombus -Chronically on anticoagulation, unclear if he has been compliant with Eliquis -Eliquis restarted on admission -Heart rate is currently stable  Type 2 diabetes -Holding off on home dose of metformin -On sliding scale insulin  Elevated creatinine, 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  Prior labs are from 06/2021 which noted to be normal renal function -Patient is unaware of being told that he has any chronic kidney disease -Will continue gentle hydration for now, recheck labs in a.m. -Follow urine output   DVT prophylaxis:  apixaban (ELIQUIS) tablet 5 mg  Code Status: Full code Family Communication: Discussed with patient Disposition Plan: Status is: Inpatient Remains inpatient appropriate because: Continued workup for hypotension and abdominal pain     Consultants:    Procedures:    Antimicrobials:  Ceftriaxone 11/22 > Azithromycin 11/22 >   Subjective: Reports that he has continued pain in the right upper quadrant.  Describes this as constant pain that is not impacted by p.o. intake.  Says he does not feel like it is gas.  Has not noted to have recent rash in this area.  Seems to be worse with inspiration.  Bowel movements have otherwise been normal.  Objective: Vitals:   11/05/22 0929 11/05/22  1133 11/05/22 1429 11/05/22 1747  BP: (!) 82/59 98/64 102/65 (!) 140/85  Pulse: 60 60 60 60  Resp: 18  17 18   Temp: 98.2 F (36.8 C)  97.9 F (36.6 C) 97.8 F (36.6 C)  TempSrc: Oral  Oral Oral  SpO2: 95%  96% 96%  Weight:      Height:        Intake/Output Summary (Last 24 hours) at  11/05/2022 1906 Last data filed at 11/05/2022 1700 Gross per 24 hour  Intake 1205.52 ml  Output --  Net 1205.52 ml   Filed Weights   11/04/22 1158 11/05/22 0550  Weight: 77.1 kg 72 kg    Examination:  General exam: Appears calm and comfortable  Respiratory system: Clear to auscultation. Respiratory effort normal. Cardiovascular system: S1 & S2 heard, RRR. No JVD, murmurs, rubs, gallops or clicks. No pedal edema. Gastrointestinal system: Abdomen is nondistended, soft and tender in her upper quadrant. No organomegaly or masses felt. Normal bowel sounds heard. Central nervous system: Alert and oriented. No focal neurological deficits. Extremities: Symmetric 5 x 5 power. Skin: No rashes, lesions or ulcers Psychiatry: Judgement and insight appear normal. Mood & affect appropriate.     Data Reviewed: I have personally reviewed following labs and imaging studies  CBC: Recent Labs  Lab 11/04/22 1325 11/05/22 0631  WBC 16.0* 12.1*  HGB 15.3 14.9  HCT 48.1 47.3  MCV 87.1 87.1  PLT 303 [id number]   Basic Metabolic Panel: Recent Labs  Lab 11/04/22 1325 11/05/22 0631  NA 134* 137  K 4.7 4.5  CL 102 105  CO2 24 23  GLUCOSE 129* 102*  BUN 53* 53*  CREATININE 2.86* 2.56*  CALCIUM 9.1 8.7*   GFR: Estimated Creatinine Clearance: 27.3 mL/min (A) (by C-G formula based on SCr of 2.56 mg/dL (H)). Liver Function Tests: Recent Labs  Lab 11/04/22 1325  AST 17  ALT 13  ALKPHOS 65  BILITOT 2.2*  PROT 8.0  ALBUMIN 4.1   Recent Labs  Lab 11/04/22 1325  LIPASE 31   No results for input(s): "AMMONIA" in the last 168 hours. Coagulation Profile: No results for input(s): "INR", "PROTIME" in the last 168 hours. Cardiac Enzymes: No results for input(s): "CKTOTAL", "CKMB", "CKMBINDEX", "TROPONINI" in the last 168 hours. BNP (last 3 results) No results for input(s): "PROBNP" in the last 8760 hours. HbA1C: No results for input(s): "HGBA1C" in the last 72 hours. CBG: No results for  input(s): "GLUCAP" in the last 168 hours. Lipid Profile: No results for input(s): "CHOL", "HDL", "LDLCALC", "TRIG", "CHOLHDL", "LDLDIRECT" in the last 72 hours. Thyroid Function Tests: No results for input(s): "TSH", "T4TOTAL", "FREET4", "T3FREE", "THYROIDAB" in the last 72 hours. Anemia Panel: No results for input(s): "VITAMINB12", "FOLATE", "FERRITIN", "TIBC", "IRON", "RETICCTPCT" in the last 72 hours. Sepsis Labs: No results for input(s): "PROCALCITON", "LATICACIDVEN" in the last 168 hours.  No results found for this or any previous visit (from the past 240 hour(s)).       Radiology Studies: DG Chest 2 View  Result Date: 11/04/2022 CLINICAL DATA:  Right-sided abdominal pain and decreased O2 sats. EXAM: CHEST - 2 VIEW COMPARISON:  09/11/2022 FINDINGS: Right lung is clear. Patchy airspace disease noted at the left base. Interstitial markings are diffusely coarsened with chronic features. The cardiopericardial silhouette is within normal limits for size. Left-sided pacer/AICD again noted. The visualized bony structures of the thorax are unremarkable. IMPRESSION: Patchy airspace disease at the left base compatible with atelectasis or pneumonia. This is new in  the interval since 09/11/2022. Electronically Signed   By: Misty Stanley M.D.   On: 11/04/2022 17:16   CT ABDOMEN PELVIS WO CONTRAST  Result Date: 11/04/2022 CLINICAL DATA:  RIGHT upper quadrant and RIGHT-side abdominal pain for 3 days, vomiting for 1 day, negative ultrasound EXAM: CT ABDOMEN AND PELVIS WITHOUT CONTRAST TECHNIQUE: Multidetector CT imaging of the abdomen and pelvis was performed following the standard protocol without IV contrast. RADIATION DOSE REDUCTION: This exam was performed according to the departmental dose-optimization program which includes automated exposure control, adjustment of the mA and/or kV according to patient size and/or use of iterative reconstruction technique. COMPARISON:  None Available. FINDINGS:  Lower chest: Minimal interstitial and interseptal prominence at lung bases. No infiltrate or effusion. Pacemaker leads RIGHT atrium and RIGHT ventricle. Calcification at cardiac apex question prior MI. Hepatobiliary: Gallbladder and liver normal appearance Pancreas: Atrophic pancreas without mass Spleen: Normal appearance Adrenals/Urinary Tract: Adrenal glands normal appearance. RIGHT kidney normal appearance without mass or hydronephrosis. Calculi in LEFT kidney measuring 7 mm and 5 mm in diameter without renal mass or hydronephrosis. Ureters and bladder unremarkable. Stomach/Bowel: Normal appendix arising from cecum at midline of pelvis. Distal colonic diverticulosis without evidence of diverticulitis. Stomach and remaining bowel loops normal appearance Vascular/Lymphatic: Atherosclerotic calcifications aorta and iliac arteries without aneurysm. No adenopathy. Reproductive: Unremarkable prostate gland and seminal vesicles Other: LEFT inguinal hernia containing fat. No free air or free fluid. No inflammatory process. Musculoskeletal: Osseous demineralization. IMPRESSION: Nonobstructing LEFT renal calculi. Distal colonic diverticulosis without evidence of diverticulitis. LEFT inguinal hernia containing fat. No acute intra-abdominal or intrapelvic abnormalities. Aortic Atherosclerosis (ICD10-I70.0). Electronically Signed   By: Lavonia Dana M.D.   On: 11/04/2022 15:04   US Abdomen Limited RUQ (LIVER/GB)  Result Date: 11/04/2022 CLINICAL DATA:  Acute right upper quadrant abdominal pain. EXAM: ULTRASOUND ABDOMEN LIMITED RIGHT UPPER QUADRANT COMPARISON:  None Available. FINDINGS: Gallbladder: No gallstones or wall thickening visualized. No sonographic Murphy sign noted by sonographer. Common bile duct: Diameter: 5 mm which is within normal limits. Liver: No focal lesion identified. Within normal limits in parenchymal echogenicity. Portal vein is patent on color Doppler imaging with normal direction of blood flow  towards the liver. Other: None. IMPRESSION: No definite abnormality seen in the right upper quadrant of the abdomen. Electronically Signed   By: Marijo Conception M.D.   On: 11/04/2022 14:27        Scheduled Meds:  apixaban  5 mg Oral BID   atorvastatin  40 mg Oral Daily   guaiFENesin  600 mg Oral BID   midodrine  5 mg Oral TID WC   Continuous Infusions:  sodium chloride 75 mL/hr at 11/05/22 1633   azithromycin Stopped (11/05/22 0229)   cefTRIAXone (ROCEPHIN)  IV Stopped (11/05/22 0111)     LOS: 0 days    Time spent: 35 mins    Kathie Dike, MD [facility]   If 7PM-7AM, please contact night-coverage [email or web address]  11/05/2022, 7:06 PM

## 2022-11-06 LAB — CBC
HCT: 42.8 % (ref 39.0–52.0)
Hemoglobin: 13.6 g/dL (ref 13.0–17.0)
MCH: 27.5 pg (ref 26.0–34.0)
MCHC: 31.8 g/dL (ref 30.0–36.0)
MCV: 86.6 fL (ref 80.0–100.0)
Platelets: 225 10*3/uL (ref 150–400)
RBC: 4.94 MIL/uL (ref 4.22–5.81)
RDW: 14.2 % (ref 11.5–15.5)
WBC: 13.2 10*3/uL — ABNORMAL HIGH (ref 4.0–10.5)
nRBC: 0 % (ref 0.0–0.2)

## 2022-11-06 LAB — COMPREHENSIVE METABOLIC PANEL
ALT: 9 U/L (ref 0–44)
AST: 16 U/L (ref 15–41)
Albumin: 3.3 g/dL — ABNORMAL LOW (ref 3.5–5.0)
Alkaline Phosphatase: 51 U/L (ref 38–126)
Anion gap: 5 (ref 5–15)
BUN: 42 mg/dL — ABNORMAL HIGH (ref 8–23)
CO2: 21 mmol/L — ABNORMAL LOW (ref 22–32)
Calcium: 8 mg/dL — ABNORMAL LOW (ref 8.9–10.3)
Chloride: 108 mmol/L (ref 98–111)
Creatinine, Ser: 1.66 mg/dL — ABNORMAL HIGH (ref 0.61–1.24)
GFR, Estimated: 44 mL/min — ABNORMAL LOW (ref >60)
Glucose, Bld: 94 mg/dL (ref 70–99)
Potassium: 4.1 mmol/L (ref 3.5–5.1)
Sodium: 134 mmol/L — ABNORMAL LOW (ref 135–145)
Total Bilirubin: 0.6 mg/dL (ref 0.3–1.2)
Total Protein: 6.8 g/dL (ref 6.5–8.1)

## 2022-11-06 MED ORDER — AMOXICILLIN-POT CLAVULANATE 875-125 MG PO TABS: 1.0000 | ORAL_TABLET | Freq: Two times a day (BID) | ORAL | 0 refills | Status: AC

## 2022-11-06 MED ORDER — TRAMADOL HCL 50 MG PO TABS: 50.0000 mg | ORAL_TABLET | Freq: Four times a day (QID) | ORAL | 0 refills | Status: AC | PRN

## 2022-11-06 MED ORDER — APIXABAN 5 MG PO TABS: 5.0000 mg | ORAL_TABLET | Freq: Two times a day (BID) | ORAL | 0 refills | Status: AC

## 2022-11-06 MED ORDER — PANTOPRAZOLE SODIUM 40 MG PO TBEC: 40.0000 mg | DELAYED_RELEASE_TABLET | Freq: Every day | ORAL | 3 refills | Status: AC

## 2022-11-06 NOTE — Discharge Summary (Signed)
Physician Discharge Summary  Roger Mooney JYN:[id number] DOB: Mar 30, 1951 DOA: 11/04/2022  PCP: Pcp, No  Admit date: 11/04/2022 Discharge date: 11/06/2022  Admitted From: Home Disposition: Home  Recommendations for Outpatient Follow-up:  Follow up with PCP in 1-2 weeks Please obtain BMP/CBC in one week Follow up with general surgery, Dr. Henreitta Leber if abdominal pain recurs/persists   Discharge Condition: Stable CODE STATUS: Full code Diet recommendation: Heart healthy  Brief/Interim Summary: 71 year old male with a history of HFrEF, LV thrombus, diabetes, presents to the hospital with right upper quadrant abdominal pain.  He did also report some cough.  Reports that he is chronically short of breath.  CT abdomen pelvis as well as abdominal ultrasound were unrevealing.  Noted to have mildly elevated bilirubin.  Creatinine also noted to be elevated which is now a new finding.  Blood pressure noted to be low.  He was admitted for further evaluation.   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 - -EF 20 to 25%-with global hypokinesis   AICD (automatic cardioverter/defibrillator) present   CAD-status post prior LAD stent--- last LHC 12/02/2020, with patent vessels apparently   Hypotension   HTN (hypertension)   DM (diabetes mellitus) (HCC)   Chronic systolic CHF (congestive heart failure) (HCC)   Atypical chest pain    Abdominal pain, right upper quadrant -Etiology is unclear -He did have a CT abdomen as well as abdominal ultrasound that were relatively unrevealing -LFTs were also noted to be unremarkable -VQ scan did not show any evidence of pulmonary embolus -He was noted to be mildly tender in the right upper quadrant -Offered patient HIDA scan, but he wishes to discharge home today -I advised him to follow-up with general surgery, Dr. Henreitta Leber if he has any worsening/recurrence  of pain so he can undergo any further workup for possible gallbladder disease -Discussed with Dr. Henreitta Leber who is also in agreement   Community-acquired pneumonia -Noted to have leukocytosis on admission with possible left lung opacity -Started on IV antibiotics -Pulmonary hygiene -Transition to course of Augmentin   HFrEF status post ICD -Overall appears to be euvolemic -Chronically on low-dose lisinopril as well as Imdur which will be held for low blood pressures -Interestingly, he also has midodrine on his home list which is being continued -Echocardiogram from 06/2021 shows EF of 20 to 25% and grade 1 diastolic dysfunction -Does not appear to be on any chronic diuretics -Monitor volume status closely   Paroxysmal atrial fib History of LV thrombus -Chronically on anticoagulation, unclear if he has been compliant with Eliquis -Eliquis restarted on admission -Heart rate is currently stable   Type 2 diabetes -Discontinue home dose metformin due to low GFR   Acute kidney injury -Baseline labs do not entirely clear, since creatinine from 08/2022 was noted to be 2.7.  Prior labs are from 06/2021 which noted to be normal renal function -Patient is unaware of being told that he has any chronic kidney disease -Patient received IV fluids in the hospital was noted to have improvement of renal function with creatinine of 2.5 on admission that improved to 1.6 with IV fluids -Will discontinue further lisinopril due to elevated creatinine     Discharge Instructions  Discharge Instructions     Diet - low sodium heart healthy   Complete by: As directed    Increase activity slowly   Complete by: As directed       Allergies as of 11/06/2022  No Known Allergies      Medication List     STOP taking these medications    colchicine 0.6 MG tablet   lisinopril 5 MG tablet Commonly known as: ZESTRIL   metFORMIN 500 MG tablet Commonly known as: GLUCOPHAGE   QUEtiapine 25 MG  tablet Commonly known as: SEROQUEL       TAKE these medications    acetaminophen 325 MG tablet Commonly known as: TYLENOL Take 2 tablets (650 mg total) by mouth every 6 (six) hours as needed for mild pain (or Fever >/= 101).   albuterol 108 (90 Base) MCG/ACT inhaler Commonly known as: VENTOLIN HFA Inhale 2 puffs into the lungs every 4 (four) hours as needed for wheezing or shortness of breath.   amoxicillin-clavulanate 875-125 MG tablet Commonly known as: AUGMENTIN Take 1 tablet by mouth 2 (two) times daily for 5 days.   apixaban 5 MG Tabs tablet Commonly known as: ELIQUIS Take 1 tablet (5 mg total) by mouth 2 (two) times daily.   atorvastatin 40 MG tablet Commonly known as: LIPITOR Take 1 tablet (40 mg total) by mouth daily.   gabapentin 300 MG capsule Commonly known as: NEURONTIN Take 300 mg by mouth 2 (two) times daily. What changed: Another medication with the same name was removed. Continue taking this medication, and follow the directions you see here.   isosorbide mononitrate 30 MG 24 hr tablet Commonly known as: IMDUR Take 0.5 tablets (15 mg total) by mouth daily.   midodrine 5 MG tablet Commonly known as: PROAMATINE Take 1 tablet (5 mg total) by mouth 3 (three) times daily with meals.   pantoprazole 40 MG tablet Commonly known as: PROTONIX Take 1 tablet (40 mg total) by mouth daily.   PARoxetine 20 MG tablet Commonly known as: PAXIL Take 1 tablet (20 mg total) by mouth daily.   traMADol 50 MG tablet Commonly known as: Ultram Take 1 tablet (50 mg total) by mouth every 6 (six) hours as needed.   Trelegy Ellipta 100-62.5-25 MCG/ACT Aepb Generic drug: Fluticasone-Umeclidin-Vilant Take 1 puff by mouth daily.        No Known Allergies  Consultations:    Procedures/Studies: DG Chest 2 View  Result Date: 11/04/2022 CLINICAL DATA:  Right-sided abdominal pain and decreased O2 sats. EXAM: CHEST - 2 VIEW COMPARISON:  09/11/2022 FINDINGS: Right lung  is clear. Patchy airspace disease noted at the left base. Interstitial markings are diffusely coarsened with chronic features. The cardiopericardial silhouette is within normal limits for size. Left-sided pacer/AICD again noted. The visualized bony structures of the thorax are unremarkable. IMPRESSION: Patchy airspace disease at the left base compatible with atelectasis or pneumonia. This is new in the interval since 09/11/2022. Electronically Signed   By: Misty Stanley M.D.   On: 11/04/2022 17:16   CT ABDOMEN PELVIS WO CONTRAST  Result Date: 11/04/2022 CLINICAL DATA:  RIGHT upper quadrant and RIGHT-side abdominal pain for 3 days, vomiting for 1 day, negative ultrasound EXAM: CT ABDOMEN AND PELVIS WITHOUT CONTRAST TECHNIQUE: Multidetector CT imaging of the abdomen and pelvis was performed following the standard protocol without IV contrast. RADIATION DOSE REDUCTION: This exam was performed according to the departmental dose-optimization program which includes automated exposure control, adjustment of the mA and/or kV according to patient size and/or use of iterative reconstruction technique. COMPARISON:  None Available. FINDINGS: Lower chest: Minimal interstitial and interseptal prominence at lung bases. No infiltrate or effusion. Pacemaker leads RIGHT atrium and RIGHT ventricle. Calcification at cardiac apex question prior MI. Hepatobiliary:  Gallbladder and liver normal appearance Pancreas: Atrophic pancreas without mass Spleen: Normal appearance Adrenals/Urinary Tract: Adrenal glands normal appearance. RIGHT kidney normal appearance without mass or hydronephrosis. Calculi in LEFT kidney measuring 7 mm and 5 mm in diameter without renal mass or hydronephrosis. Ureters and bladder unremarkable. Stomach/Bowel: Normal appendix arising from cecum at midline of pelvis. Distal colonic diverticulosis without evidence of diverticulitis. Stomach and remaining bowel loops normal appearance Vascular/Lymphatic:  Atherosclerotic calcifications aorta and iliac arteries without aneurysm. No adenopathy. Reproductive: Unremarkable prostate gland and seminal vesicles Other: LEFT inguinal hernia containing fat. No free air or free fluid. No inflammatory process. Musculoskeletal: Osseous demineralization. IMPRESSION: Nonobstructing LEFT renal calculi. Distal colonic diverticulosis without evidence of diverticulitis. LEFT inguinal hernia containing fat. No acute intra-abdominal or intrapelvic abnormalities. Aortic Atherosclerosis (ICD10-I70.0). Electronically Signed   By: Lavonia Dana M.D.   On: 11/04/2022 15:04   US Abdomen Limited RUQ (LIVER/GB)  Result Date: 11/04/2022 CLINICAL DATA:  Acute right upper quadrant abdominal pain. EXAM: ULTRASOUND ABDOMEN LIMITED RIGHT UPPER QUADRANT COMPARISON:  None Available. FINDINGS: Gallbladder: No gallstones or wall thickening visualized. No sonographic Murphy sign noted by sonographer. Common bile duct: Diameter: 5 mm which is within normal limits. Liver: No focal lesion identified. Within normal limits in parenchymal echogenicity. Portal vein is patent on color Doppler imaging with normal direction of blood flow towards the liver. Other: None. IMPRESSION: No definite abnormality seen in the right upper quadrant of the abdomen. Electronically Signed   By: Marijo Conception M.D.   On: 11/04/2022 14:27      Subjective: Feels that abdominal pain is better today.  He is tolerating diet.  No vomiting.  Discharge Exam: Vitals:   11/05/22 1747 11/05/22 2141 11/06/22 0552 11/06/22 0830  BP: (!) 140/85 (!) 141/90 122/72   Pulse: 60 60 60   Resp: 18 17 16    Temp: 97.8 F (36.6 C) 98.3 F (36.8 C) 98.5 F (36.9 C)   TempSrc: Oral Oral Oral   SpO2: 96% 96% 96% 92%  Weight:      Height:        General: Pt is alert, awake, not in acute distress Cardiovascular: RRR, S1/S2 +, no rubs, no gallops Respiratory: CTA bilaterally, no wheezing, no rhonchi Abdominal: Soft, NT, ND, bowel  sounds + Extremities: no edema, no cyanosis    The results of significant diagnostics from this hospitalization (including imaging, microbiology, ancillary and laboratory) are listed below for reference.     Microbiology: No results found for this or any previous visit (from the past 240 hour(s)).   Labs: BNP (last 3 results) Recent Labs    09/11/22 1735  BNP 0000000   Basic Metabolic Panel: Recent Labs  Lab 11/04/22 1325 11/05/22 0631 11/06/22 0300  NA 134* 137 134*  K 4.7 4.5 4.1  CL 102 105 108  CO2 24 23 21*  GLUCOSE 129* 102* 94  BUN 53* 53* 42*  CREATININE 2.86* 2.56* 1.66*  CALCIUM 9.1 8.7* 8.0*   Liver Function Tests: Recent Labs  Lab 11/04/22 1325 11/06/22 0300  AST 17 16  ALT 13 9  ALKPHOS 65 51  BILITOT 2.2* 0.6  PROT 8.0 6.8  ALBUMIN 4.1 3.3*   Recent Labs  Lab 11/04/22 1325  LIPASE 31   No results for input(s): "AMMONIA" in the last 168 hours. CBC: Recent Labs  Lab 11/04/22 1325 11/05/22 0631 11/06/22 0300  WBC 16.0* 12.1* 13.2*  HGB 15.3 14.9 13.6  HCT 48.1 47.3 42.8  MCV 87.1  87.1 86.6  PLT 303 253 225   Cardiac Enzymes: No results for input(s): "CKTOTAL", "CKMB", "CKMBINDEX", "TROPONINI" in the last 168 hours. BNP: Invalid input(s): "POCBNP" CBG: No results for input(s): "GLUCAP" in the last 168 hours. D-Dimer Recent Labs    11/04/22 1325  DDIMER 1.05*   Hgb A1c No results for input(s): "HGBA1C" in the last 72 hours. Lipid Profile No results for input(s): "CHOL", "HDL", "LDLCALC", "TRIG", "CHOLHDL", "LDLDIRECT" in the last 72 hours. Thyroid function studies No results for input(s): "TSH", "T4TOTAL", "T3FREE", "THYROIDAB" in the last 72 hours.  Invalid input(s): "FREET3" Anemia work up No results for input(s): "VITAMINB12", "FOLATE", "FERRITIN", "TIBC", "IRON", "RETICCTPCT" in the last 72 hours. Urinalysis    Component Value Date/Time   COLORURINE STRAW (A) 11/04/2022 1620   APPEARANCEUR CLEAR 11/04/2022 1620    LABSPEC 1.006 11/04/2022 1620   PHURINE 5.0 11/04/2022 1620   GLUCOSEU NEGATIVE 11/04/2022 1620   HGBUR NEGATIVE 11/04/2022 1620   BILIRUBINUR NEGATIVE 11/04/2022 1620   KETONESUR NEGATIVE 11/04/2022 1620   PROTEINUR NEGATIVE 11/04/2022 1620   NITRITE NEGATIVE 11/04/2022 1620   LEUKOCYTESUR NEGATIVE 11/04/2022 1620   Sepsis Labs Recent Labs  Lab 11/04/22 1325 11/05/22 0631 11/06/22 0300  WBC 16.0* 12.1* 13.2*   Microbiology No results found for this or any previous visit (from the past 240 hour(s)).   Time coordinating discharge: 75mins  SIGNED:   Kathie Dike, MD  [facility] 11/06/2022, 8:22 PM   If 7PM-7AM, please contact night-coverage [email or web address]

## 2022-11-06 NOTE — Progress Notes (Signed)
Patient has rested in bed through the night. 1 prn medication given during this shift. Most recent vital signs at T 98.5, P 60 RR 16 B/P 122/72, O2 sat 96% on room air.

## 2022-11-06 NOTE — Progress Notes (Signed)
Nsg Discharge Note  Admit Date:  11/04/2022 Discharge date: 11/06/2022   Roger Mooney to be D/C'd Home per MD order.  AVS completed.   Patient/caregiver able to verbalize understanding.  Discharge Medication: Allergies as of 11/06/2022   No Known Allergies      Medication List     STOP taking these medications    colchicine 0.6 MG tablet   lisinopril 5 MG tablet Commonly known as: ZESTRIL   metFORMIN 500 MG tablet Commonly known as: GLUCOPHAGE   QUEtiapine 25 MG tablet Commonly known as: SEROQUEL       TAKE these medications    acetaminophen 325 MG tablet Commonly known as: TYLENOL Take 2 tablets (650 mg total) by mouth every 6 (six) hours as needed for mild pain (or Fever >/= 101).   albuterol 108 (90 Base) MCG/ACT inhaler Commonly known as: VENTOLIN HFA Inhale 2 puffs into the lungs every 4 (four) hours as needed for wheezing or shortness of breath.   amoxicillin-clavulanate 875-125 MG tablet Commonly known as: AUGMENTIN Take 1 tablet by mouth 2 (two) times daily for 5 days.   apixaban 5 MG Tabs tablet Commonly known as: ELIQUIS Take 1 tablet (5 mg total) by mouth 2 (two) times daily.   atorvastatin 40 MG tablet Commonly known as: LIPITOR Take 1 tablet (40 mg total) by mouth daily.   gabapentin 300 MG capsule Commonly known as: NEURONTIN Take 300 mg by mouth 2 (two) times daily. What changed: Another medication with the same name was removed. Continue taking this medication, and follow the directions you see here.   isosorbide mononitrate 30 MG 24 hr tablet Commonly known as: IMDUR Take 0.5 tablets (15 mg total) by mouth daily.   midodrine 5 MG tablet Commonly known as: PROAMATINE Take 1 tablet (5 mg total) by mouth 3 (three) times daily with meals.   pantoprazole 40 MG tablet Commonly known as: PROTONIX Take 1 tablet (40 mg total) by mouth daily.   PARoxetine 20 MG tablet Commonly known as: PAXIL Take 1 tablet (20 mg total) by mouth  daily.   traMADol 50 MG tablet Commonly known as: Ultram Take 1 tablet (50 mg total) by mouth every 6 (six) hours as needed.   Trelegy Ellipta 100-62.5-25 MCG/ACT Aepb Generic drug: Fluticasone-Umeclidin-Vilant Take 1 puff by mouth daily.        Discharge Assessment: Vitals:   11/05/22 2141 11/06/22 0552  BP: (!) 141/90 122/72  Pulse: 60 60  Resp: 17 16  Temp: 98.3 F (36.8 C) 98.5 F (36.9 C)  SpO2: 96% 96%   Skin clean, dry and intact without evidence of skin break down, no evidence of skin tears noted. IV catheter discontinued intact. Site without signs and symptoms of complications - no redness or edema noted at insertion site, patient denies c/o pain - only slight tenderness at site.  Dressing with slight pressure applied.  D/c Instructions-Education: Discharge instructions given to patient/family with verbalized understanding. D/c education completed with patient/family including follow up instructions, medication list, d/c activities limitations if indicated, with other d/c instructions as indicated by MD - patient able to verbalize understanding, all questions fully answered. Patient instructed to return to ED, call 911, or call MD for any changes in condition.  Patient escorted via WC, and D/C home via private auto.  Verl Dicker, RN 11/06/2022 2:42 PM

## 2022-11-06 NOTE — Care Management Important Message (Signed)
Important Message  Patient Details  Name: Roger Mooney MRN: [id number] Date of Birth: 1951/07/29   Medicare Important Message Given:  Yes     Corey Harold 11/06/2022, 12:04 PM
